# Patient Record
Sex: Female | Born: 1965 | Race: Black or African American | Hispanic: No | Marital: Single | Smoking: Never smoker
Health system: Southern US, Community
[De-identification: ages and names within clinical notes are randomized; demographics above are authoritative.]

## PROBLEM LIST (undated history)

## (undated) ENCOUNTER — Emergency Department: Admission: EM

## (undated) DIAGNOSIS — J45909 Unspecified asthma, uncomplicated: Secondary | ICD-10-CM

## (undated) DIAGNOSIS — I10 Essential (primary) hypertension: Secondary | ICD-10-CM

## (undated) DIAGNOSIS — E119 Type 2 diabetes mellitus without complications: Secondary | ICD-10-CM

## (undated) HISTORY — PX: TUBAL LIGATION: SHX77

## (undated) HISTORY — PX: JOINT REPLACEMENT: SHX530

---

## 2005-09-05 ENCOUNTER — Emergency Department: Payer: Self-pay | Admitting: Unknown Physician Specialty

## 2006-01-15 ENCOUNTER — Emergency Department: Payer: Self-pay | Admitting: Emergency Medicine
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
## 2006-05-09 ENCOUNTER — Other Ambulatory Visit: Payer: Self-pay

## 2006-05-09 ENCOUNTER — Emergency Department: Payer: Self-pay | Admitting: Emergency Medicine

## 2006-05-09 IMAGING — CT CT CHEST W/ CM
2 series · 16 of 31 positions shown, 20 images · IV contrast (APPLIED)
Comparison: none

REASON FOR EXAM: Pleuritic chest pain
COMMENTS:

[Series 4: soft tissue · axial · 0.74mm/px · z∈[-562,-475]mm · 3 of 96 slices shown]
[im 8/96  mediastinal]
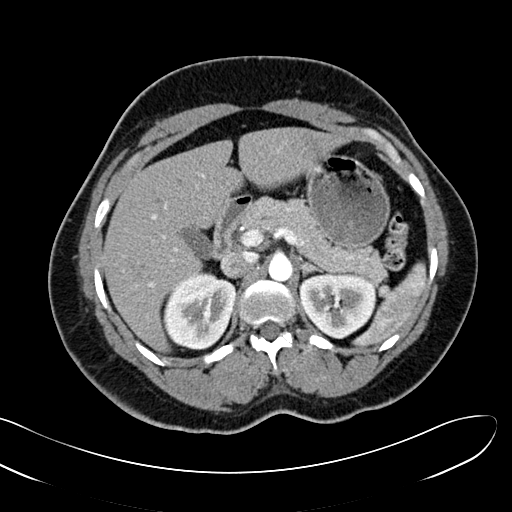
[im 22/96  mediastinal]
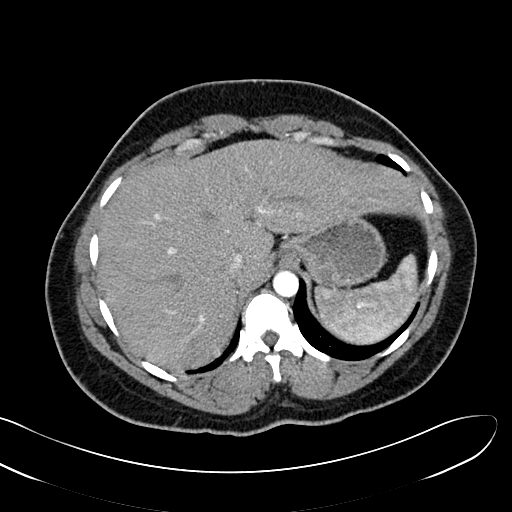
[im 37/96  mediastinal]
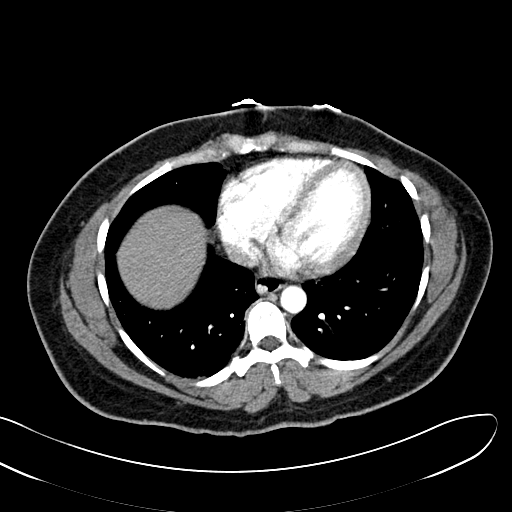

[Series 5: lung windows · axial · 0.74mm/px · z∈[-553,-322]mm · 13 of 93 slices shown, 17 images]
[im 8/93  mediastinal]
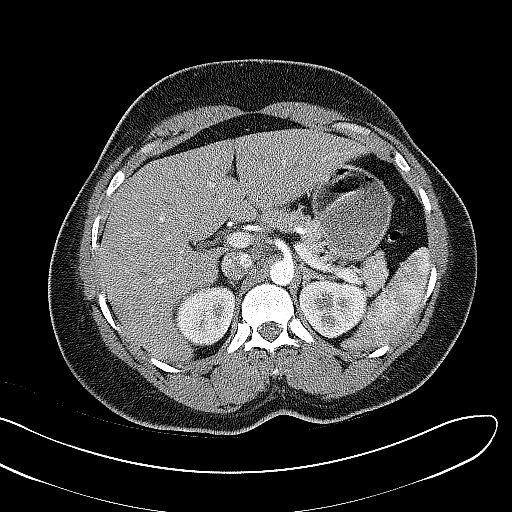
[im 8/93  lung]
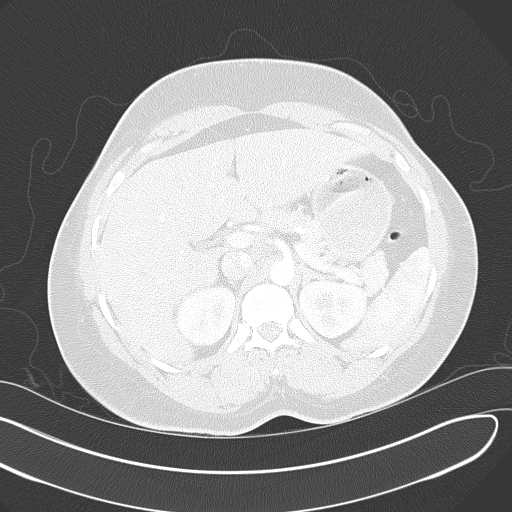
[im 15/93  lung]
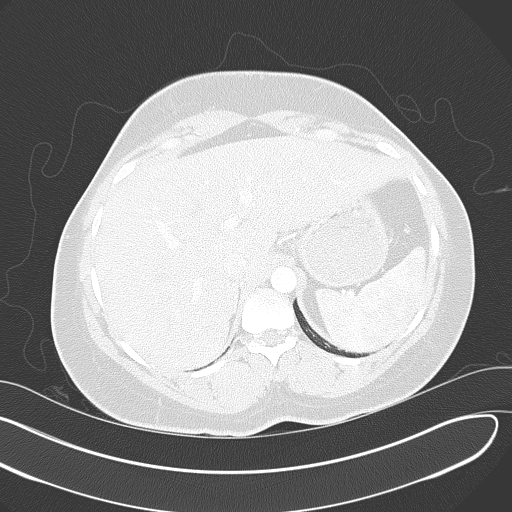
[im 22/93  lung]
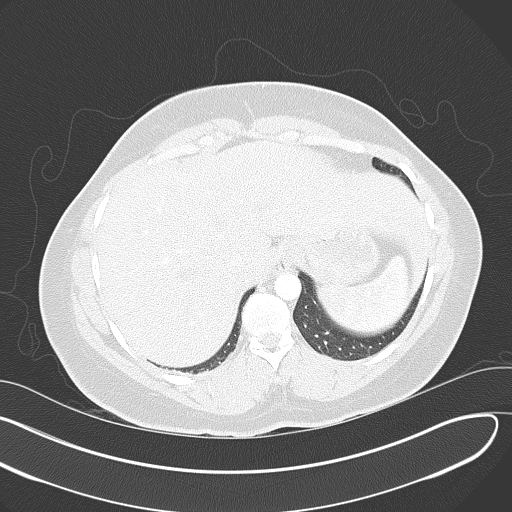
[im 29/93  lung]
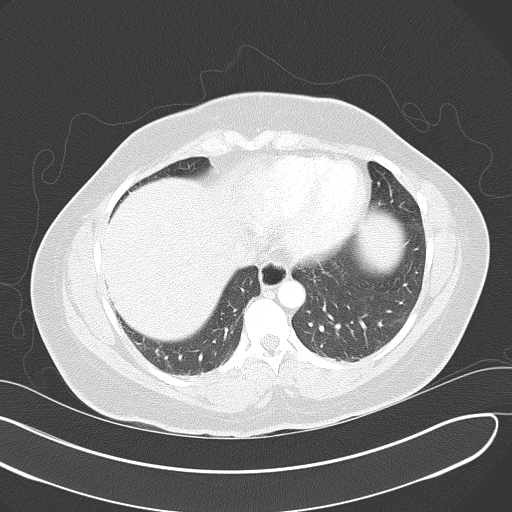
[im 36/93  mediastinal]
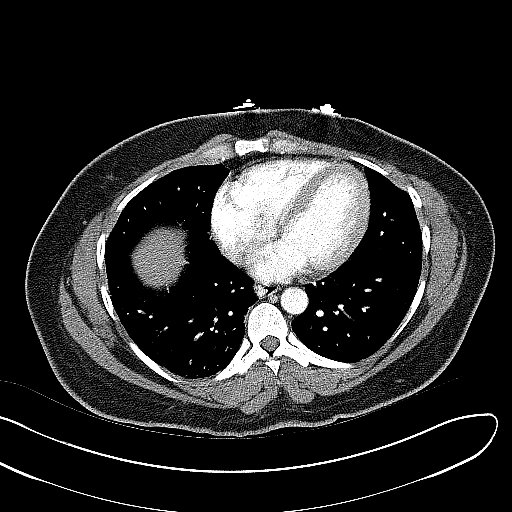
[im 36/93  lung]
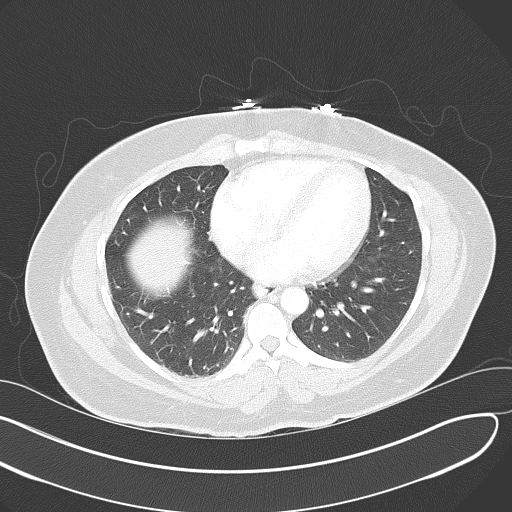
[im 43/93  lung]
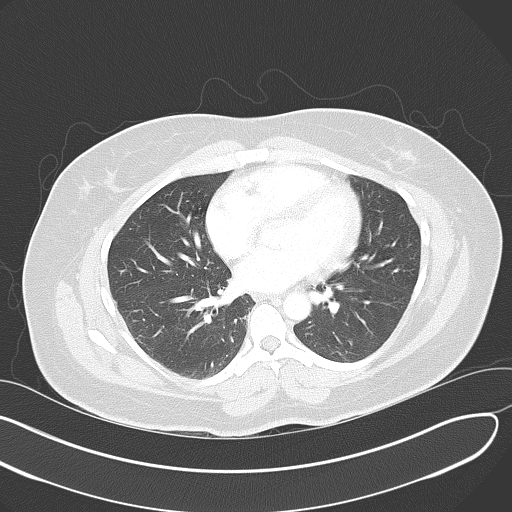
[im 47/93  lung]
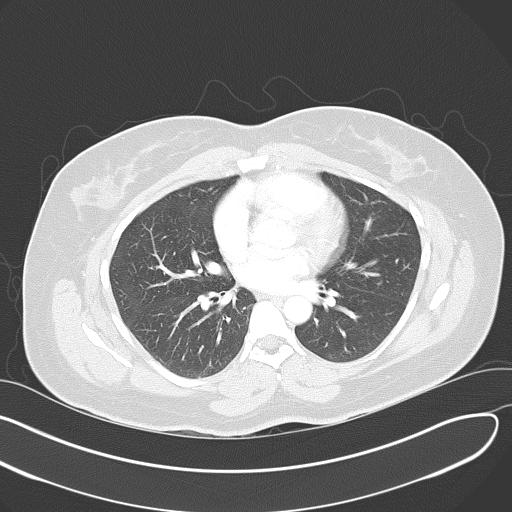
[im 50/93  lung]
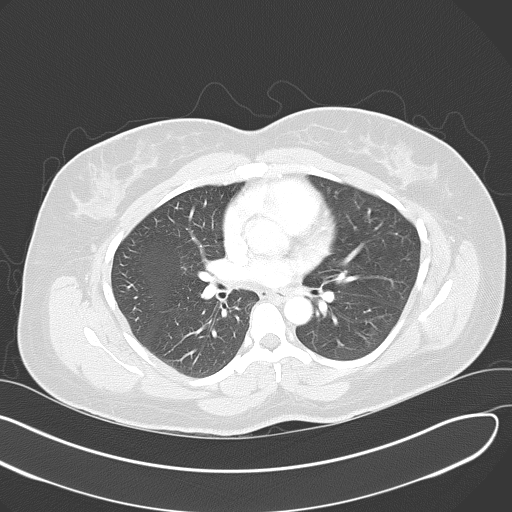
[im 57/93  mediastinal]
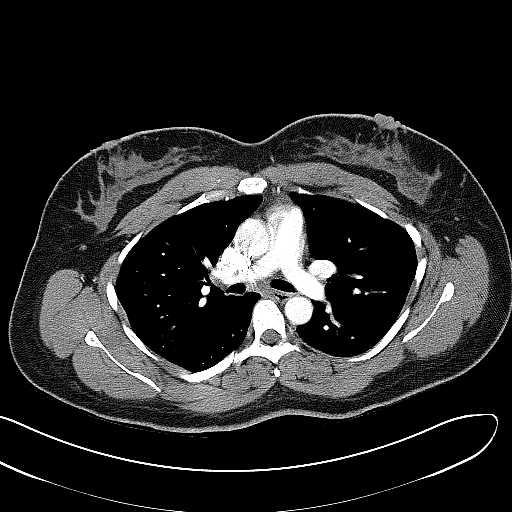
[im 57/93  lung]
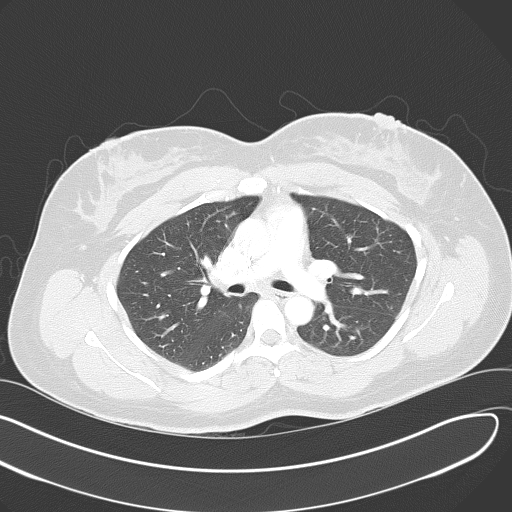
[im 64/93  lung]
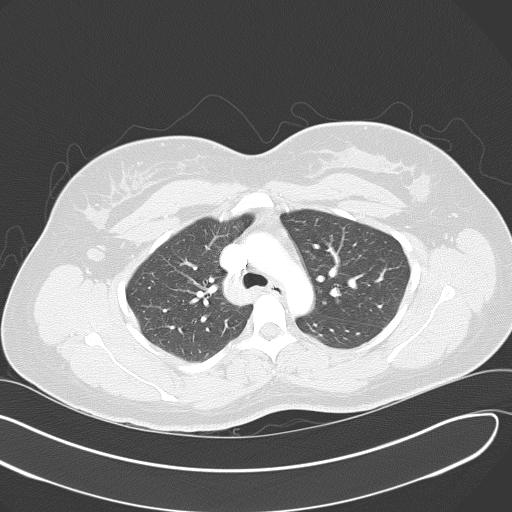
[im 71/93  lung]
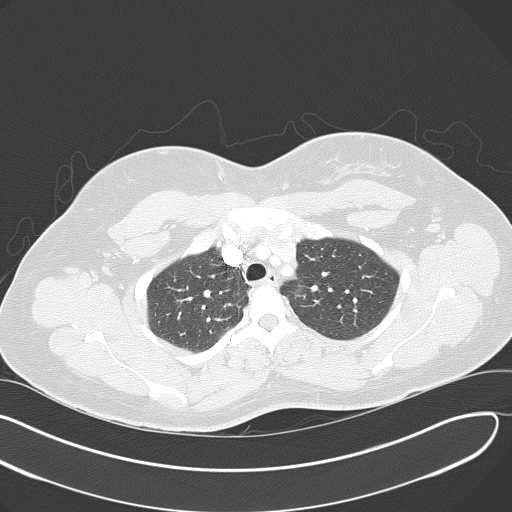
[im 78/93  lung]
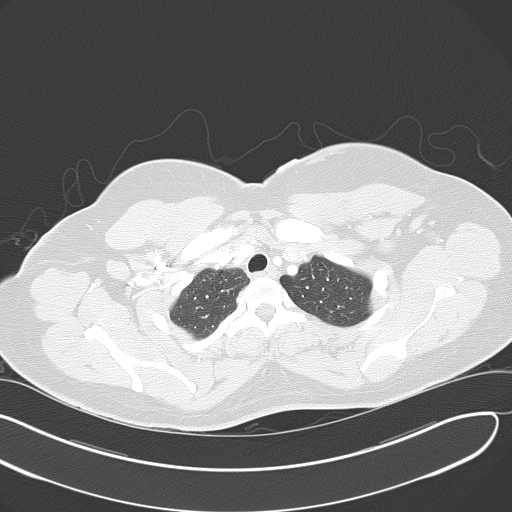
[im 85/93  mediastinal]
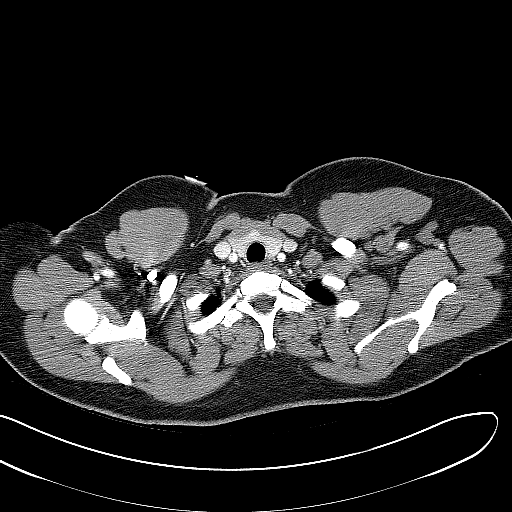
[im 85/93  lung]
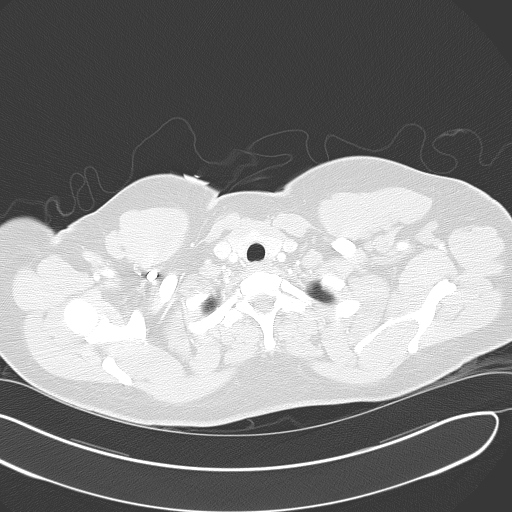

[16 of 31 positions shown; findings below may reference images not displayed]

PROCEDURE:     CT  - CT CHEST (FOR PE) W  - [DATE]  [DATE]

RESULT:     Emergent enhancement CHEST CT was performed for pleuritic chest
pain in the midchest.    The report was faxed emergency room.  There is a
good bolus of contrast in arteries.  No filling defects are noted to suggest
pulmonary emboli.   No mediastinal masses are seen.  There appears there is
a prominent azygous vein. On the lung windows, the lung fields are clear
with no effusions identified.
IMPRESSION: No CT findings of pulmonary embolus.  The thoracic aorta
appears intact.

## 2009-11-12 ENCOUNTER — Ambulatory Visit: Payer: Self-pay | Admitting: Family Medicine

## 2009-11-12 IMAGING — CR DG CHEST 2V
1 series · 2 of 2 positions shown · non-contrast
Comparison: none

REASON FOR EXAM: bronchitis,cough
COMMENTS:

PROCEDURE:     KDR - KDXR CHEST PA (OR AP) AND LAT  - [DATE] [DATE]
RESULT:     The lung fields are clear. The heart, mediastinal and osseous
structures are normal in appearance.

[Series 1: view not recorded · 0.17mm/px · 2 of 2 slices shown]
[im 1/2]
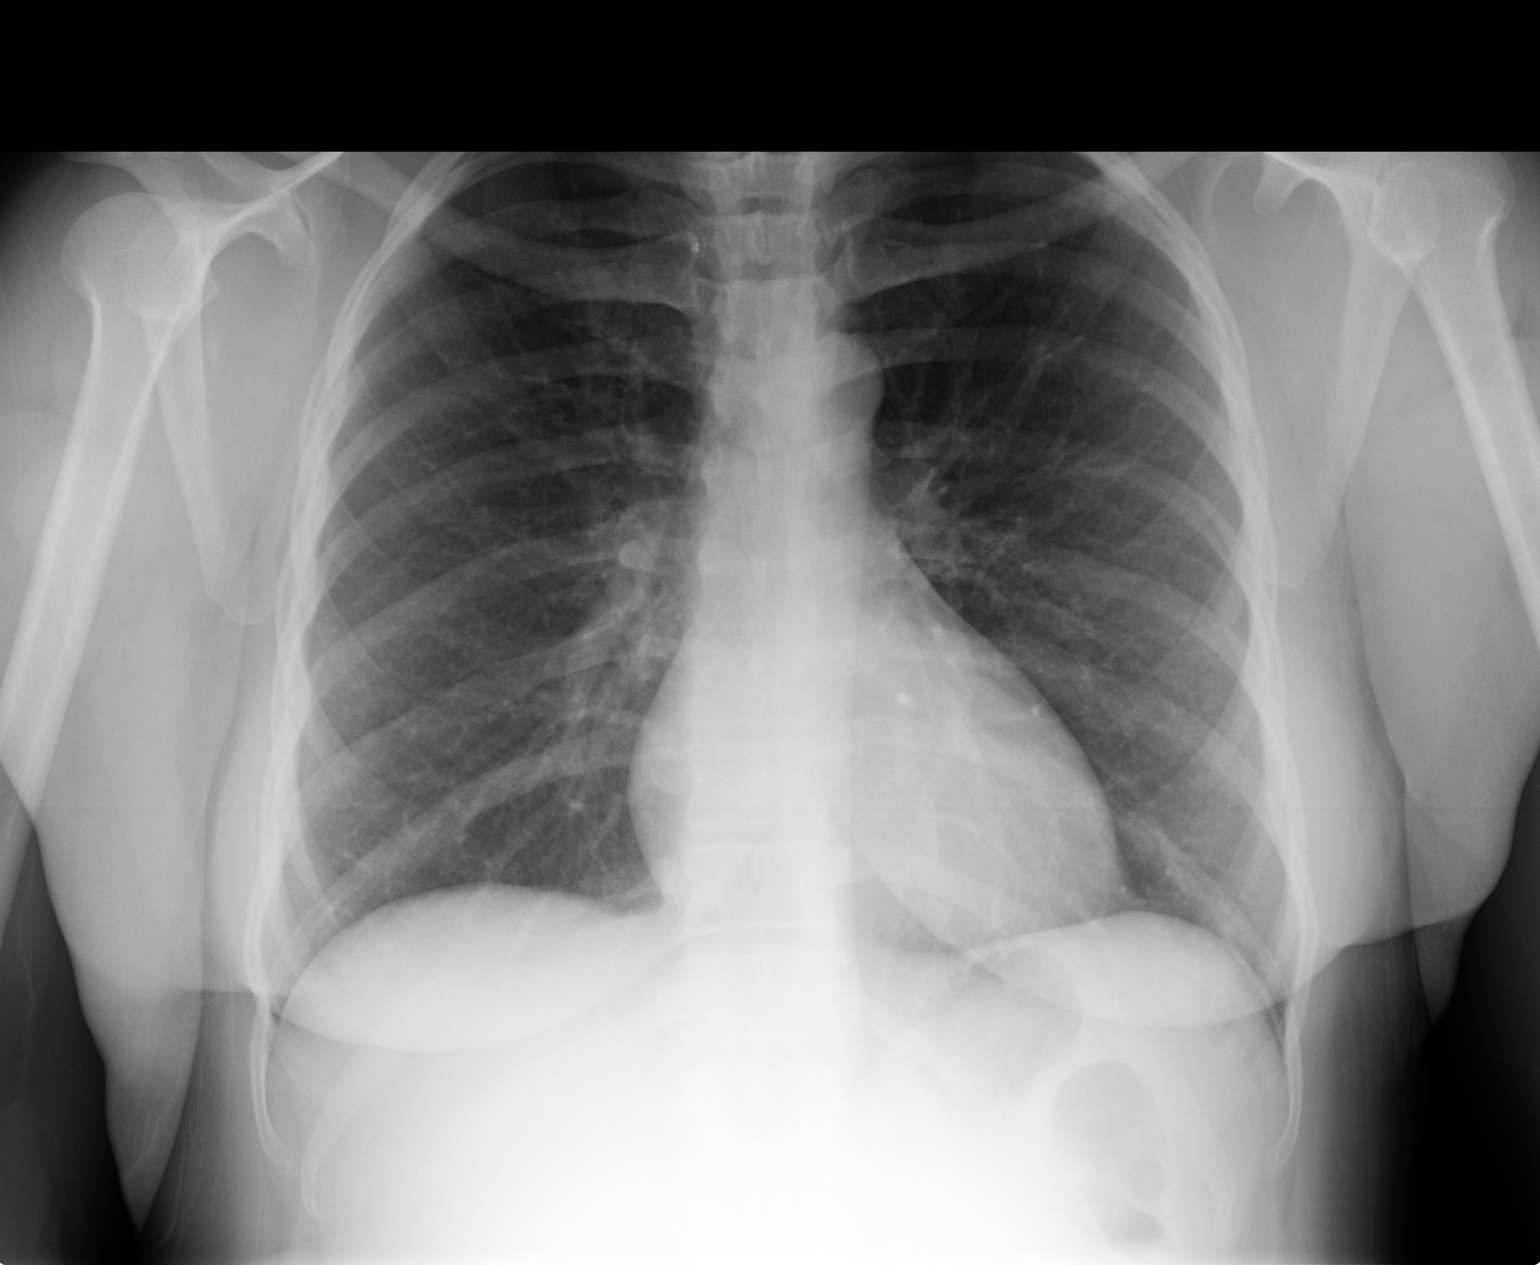
[im 2/2]
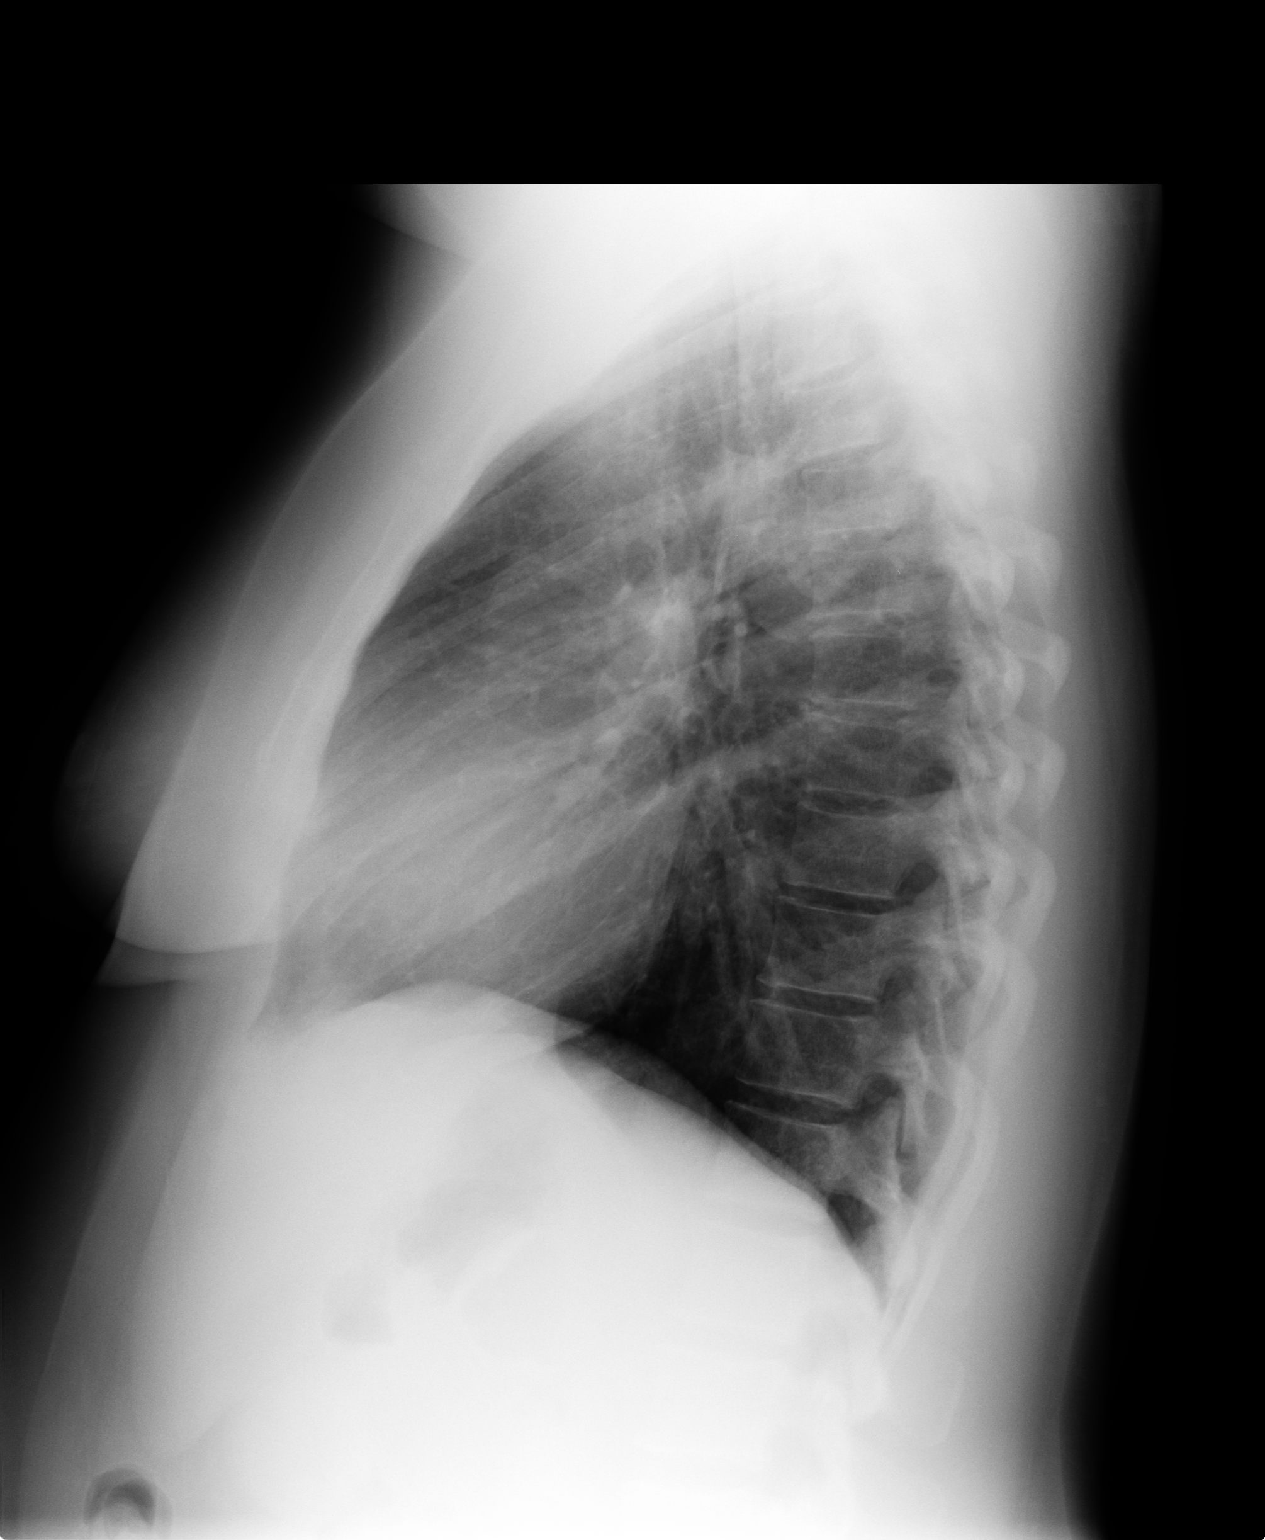

[2 of 2 positions shown; findings below may reference images not displayed]

IMPRESSION: No significant abnormalities are noted.

## 2010-11-27 ENCOUNTER — Emergency Department: Payer: Self-pay | Admitting: Emergency Medicine

## 2010-11-27 IMAGING — CT CT STONE STUDY
1 of 2 series · 14 of 32 positions shown, 18 images · non-contrast
Comparison: none

REASON FOR EXAM: flank pain
COMMENTS:

PROCEDURE:     CT  - CT ABDOMEN /PELVIS WO (STONE)  - [DATE]  [DATE]
RESULT:     Comparison: None
TECHNIQUE: Multiple axial images from the lung bases to the symphysis pubis
were obtained without oral and without intravenous contrast.

[Series 2: stone · axial · 0.68mm/px · z∈[-512,-152]mm · 14 of 137 slices shown, 18 images]
[im 11/137  soft-tissue]
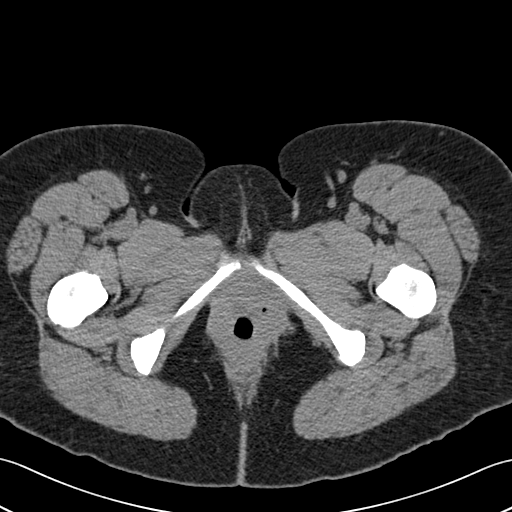
[im 11/137  bone]
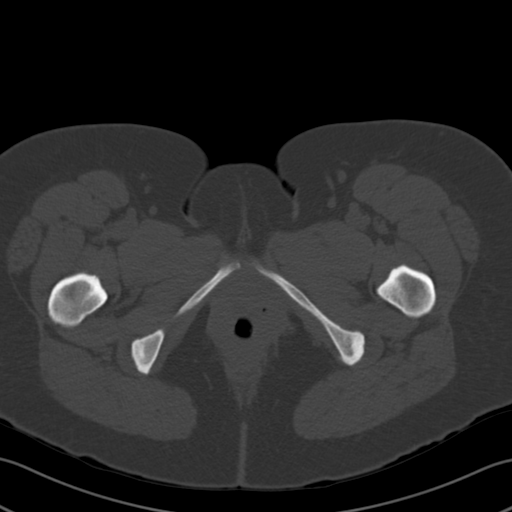
[im 21/137  soft-tissue]
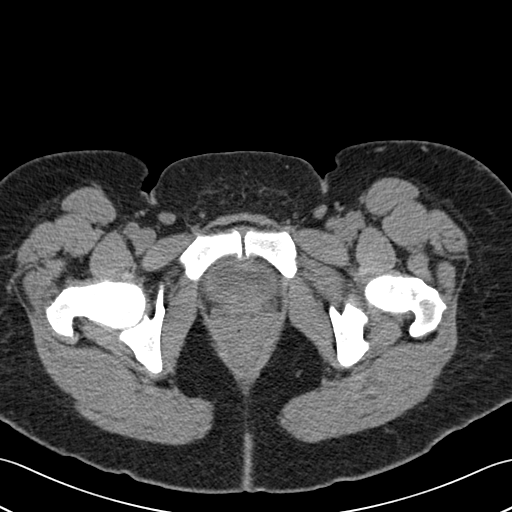
[im 31/137  soft-tissue]
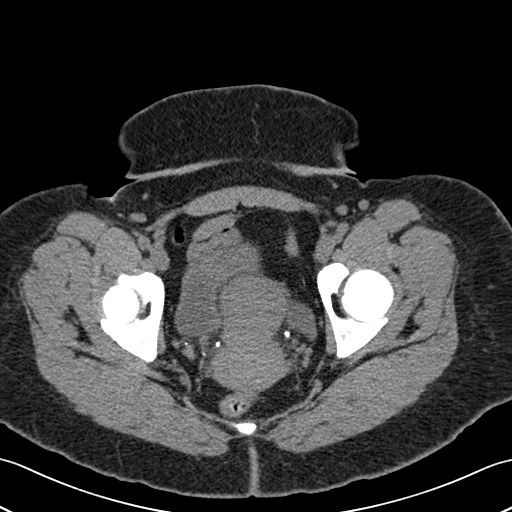
[im 41/137  soft-tissue]
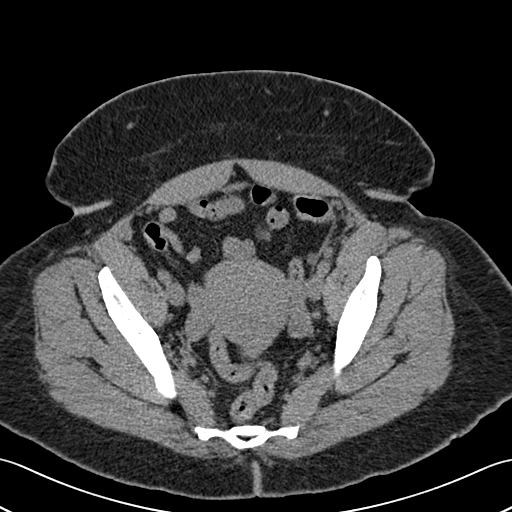
[im 51/137  soft-tissue]
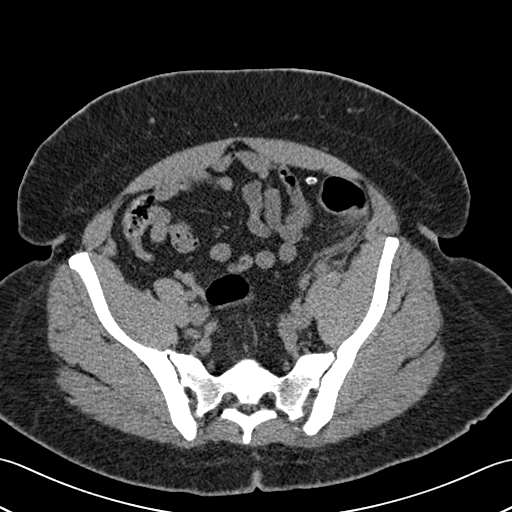
[im 61/137  soft-tissue]
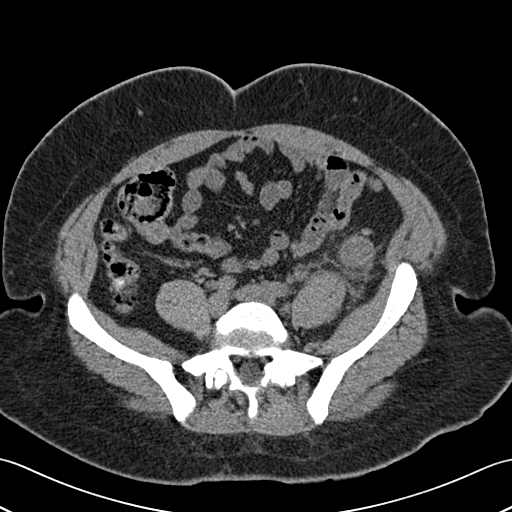
[im 76/137  soft-tissue]
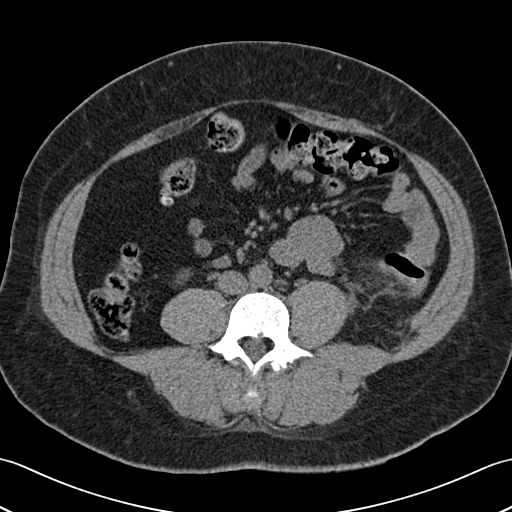
[im 86/137  soft-tissue]
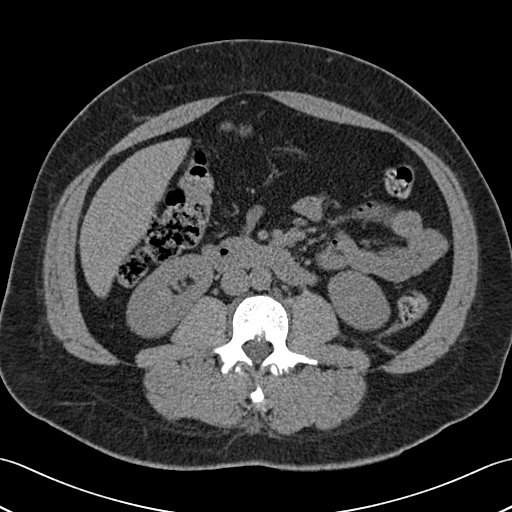
[im 96/137  soft-tissue]
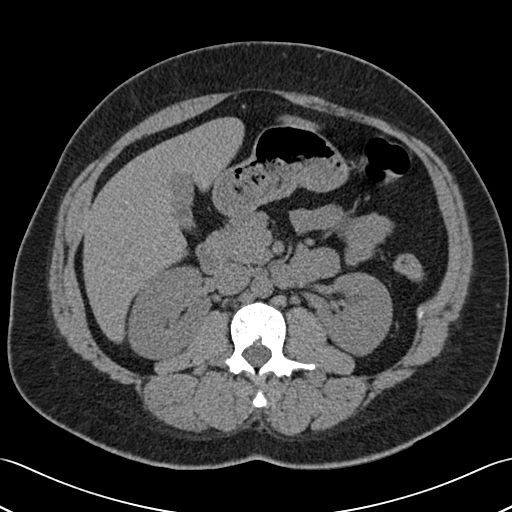
[im 96/137  bone]
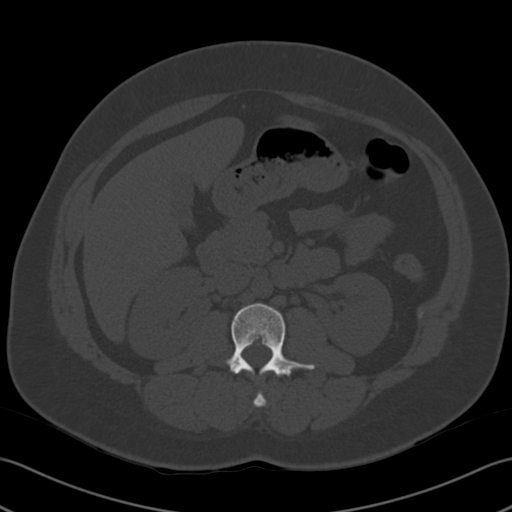
[im 106/137  soft-tissue]
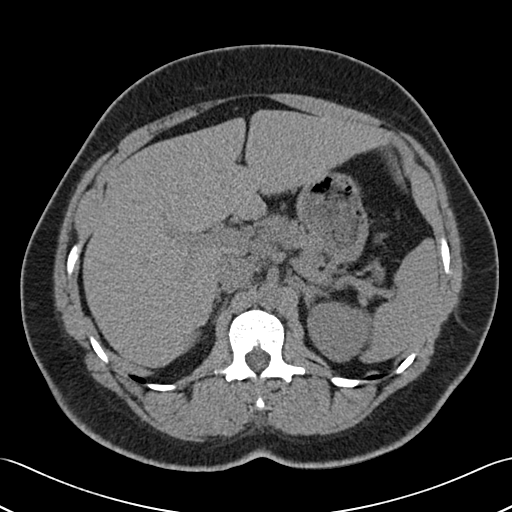
[im 116/137  soft-tissue]
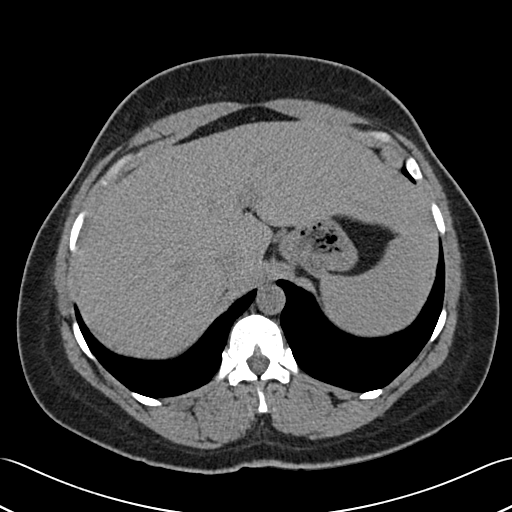
[im 116/137  lung]
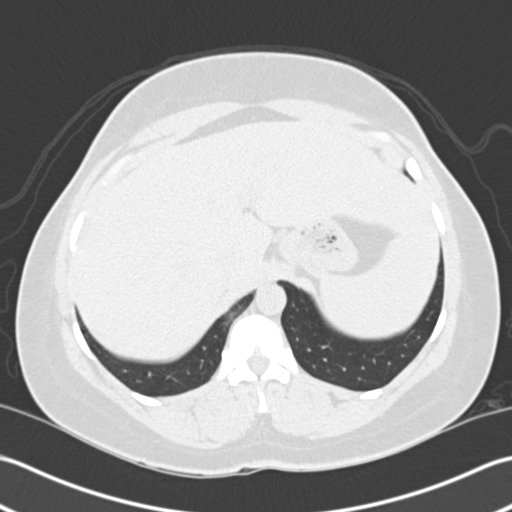
[im 121/137  lung]
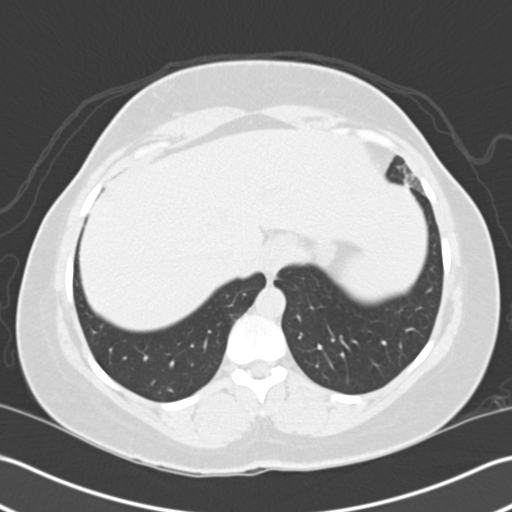
[im 126/137  soft-tissue]
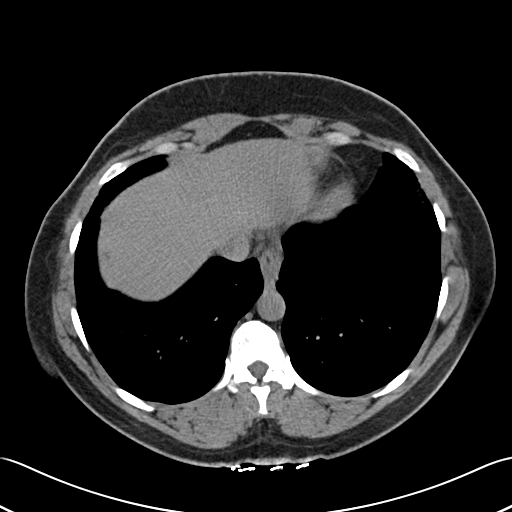
[im 126/137  lung]
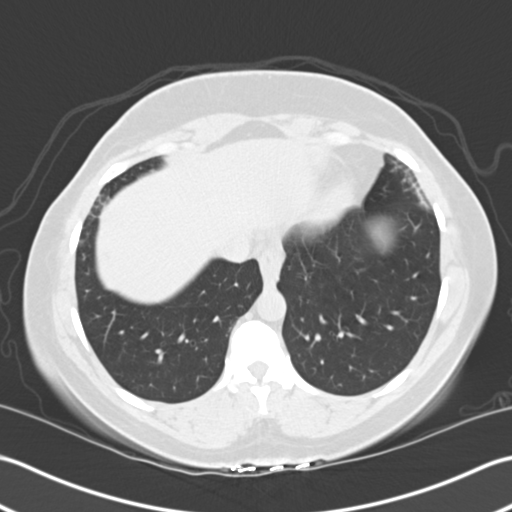
[im 131/137  lung]
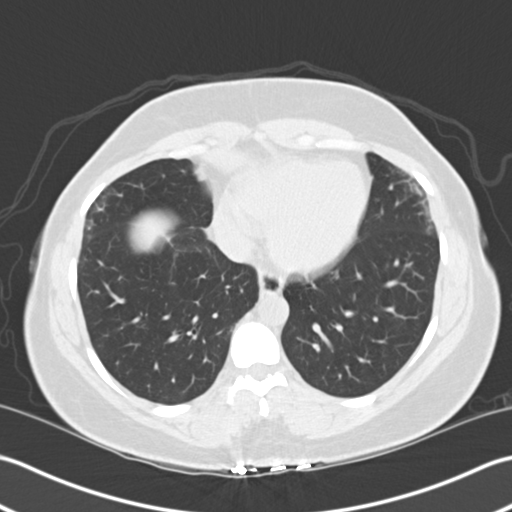

[14 of 32 positions shown; findings below may reference images not displayed]

FINDINGS: Minimal subpleural opacities likely represent atelectasis scarring.

Lack of intravenous contrast limits evaluation of the solid abdominal
organs.  Grossly, the liver, spleen, adrenals, pancreas, and gallbladder are
unremarkable.

No hydronephrosis. No renal calculi are identified. No ureterectasis. There
are multiple 3-4 mm calcifications in the region of the course of the mid
aspect of the left ureter. These are felt to be related to phleboliths
within the gonadal vein. However, it would be difficult to exclude a
nonobstructing calculus.

The small and large bowel are normal in caliber. There is mild wall
thickening with adjacent stranding of the descending colon. There is
diverticulosis of the descending colon. The inflammatory change of the
descending colon may be related to diverticulitis versus other colitis.
There are no adjacent fluid collections. The appendix is normal.

Small low-attenuation structure in the region of the urethra and vagina may
represent a urethral diverticulum versus vaginal or periurethral cyst. It
measures 2.0 x 1.2 cm in greatest axial dimension.

No aggressive lytic or sclerotic osseous lesions identified.
IMPRESSION: 1. Diverticulosis of the descending colon with wall thickening and adjacent
inflammatory change of the descending colon. This may represent acute
diverticulitis. Given the length of inflammatory changes, other etiologies
such as an infectious, inflammatory, or ischemic colitis should be
considered. Direct visualization is suggested after the acute episode, as
malignancy can have a similar appearance
2. Multiple small 3-4 mm calcifications in the region of the mid left ureter
are felt to be related to phleboliths in the gonadal vein. However, it would
be difficult to exclude a nonobstructing ureteral calculus.
3. Urethral diverticulum versus periurethral or vaginal cyst. Clinical
correlation is suggested.

## 2012-06-30 ENCOUNTER — Emergency Department: Payer: Self-pay | Admitting: Internal Medicine

## 2012-09-29 ENCOUNTER — Emergency Department: Payer: Self-pay | Admitting: Internal Medicine

## 2013-11-28 ENCOUNTER — Emergency Department: Payer: Self-pay | Admitting: Emergency Medicine

## 2013-11-28 LAB — URINALYSIS, COMPLETE
RBC,UR: 4713 /HPF (ref 0–5)
Specific Gravity: 1.025 (ref 1.003–1.030)

## 2013-11-28 LAB — BASIC METABOLIC PANEL
ANION GAP: 4 — AB (ref 7–16)
BUN: 12 mg/dL (ref 7–18)
CO2: 28 mmol/L (ref 21–32)
CREATININE: 0.94 mg/dL (ref 0.60–1.30)
Calcium, Total: 8.4 mg/dL — ABNORMAL LOW (ref 8.5–10.1)
Chloride: 106 mmol/L (ref 98–107)
EGFR (African American): >60
EGFR (Non-African Amer.): >60
Glucose: 94 mg/dL (ref 65–99)
Osmolality: 275 (ref 275–301)
POTASSIUM: 3.7 mmol/L (ref 3.5–5.1)
Sodium: 138 mmol/L (ref 136–145)

## 2013-11-28 LAB — CBC
HCT: 37.9 % (ref 35.0–47.0)
HGB: 12.5 g/dL (ref 12.0–16.0)
MCH: 28.1 pg (ref 26.0–34.0)
MCHC: 33.1 g/dL (ref 32.0–36.0)
MCV: 85 fL (ref 80–100)
Platelet: 257 10*3/uL (ref 150–440)
RBC: 4.46 10*6/uL (ref 3.80–5.20)
RDW: 13.7 % (ref 11.5–14.5)
WBC: 10.9 10*3/uL (ref 3.6–11.0)

## 2013-11-28 LAB — PREGNANCY, URINE: PREGNANCY TEST, URINE: NEGATIVE m[IU]/mL

## 2013-11-28 IMAGING — CT CT STONE STUDY
2 of 4 series · 17 of 46 positions shown, 19 images · non-contrast
Comparison: CT [DATE]

CLINICAL DATA: Left flank pain

EXAM:
CT ABDOMEN AND PELVIS WITHOUT CONTRAST
TECHNIQUE: Multidetector CT imaging of the abdomen and pelvis was performed
following the standard protocol without IV contrast.

[Series 2: stone standard full · axial · 0.77mm/px · z∈[-1050,-645]mm · 14 of 89 slices shown, 16 images]
[im 4/89  soft-tissue]
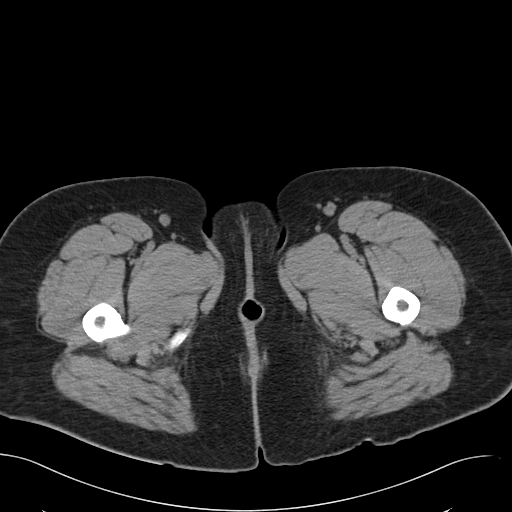
[im 4/89  bone]
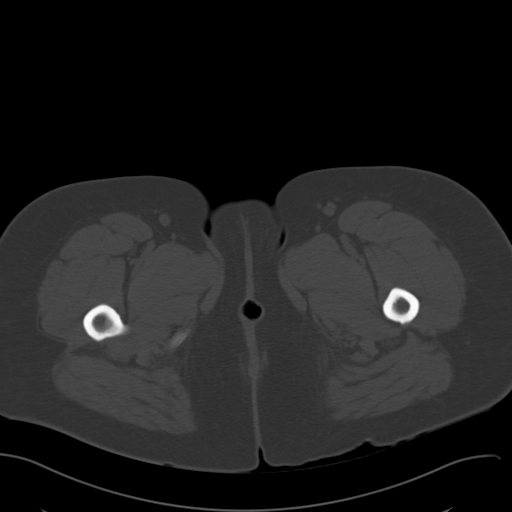
[im 12/89  soft-tissue]
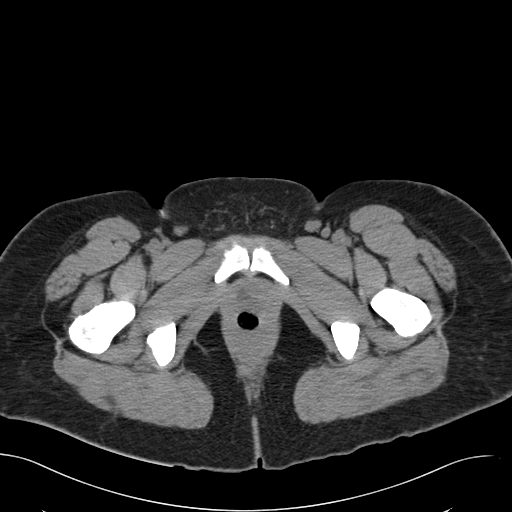
[im 19/89  soft-tissue]
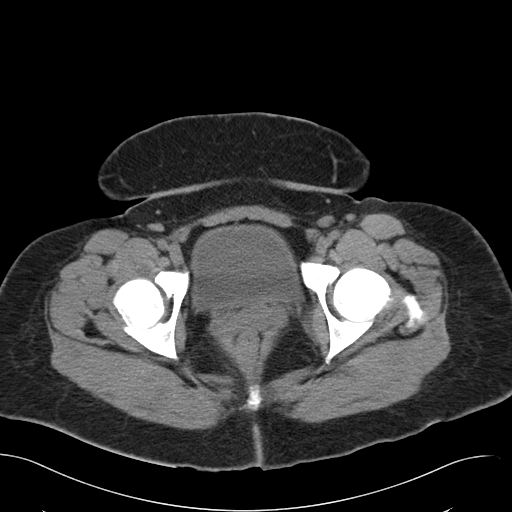
[im 23/89  soft-tissue]
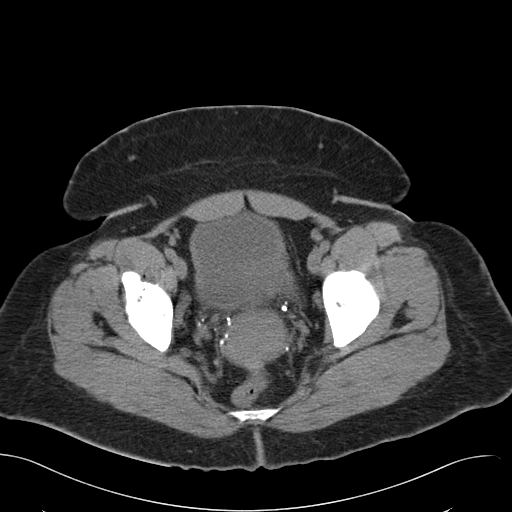
[im 30/89  soft-tissue]
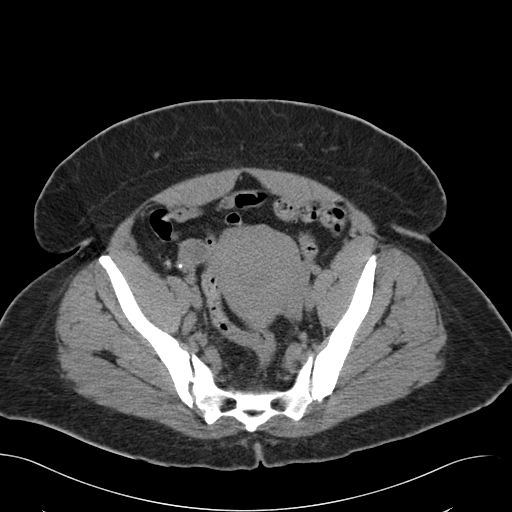
[im 37/89  soft-tissue]
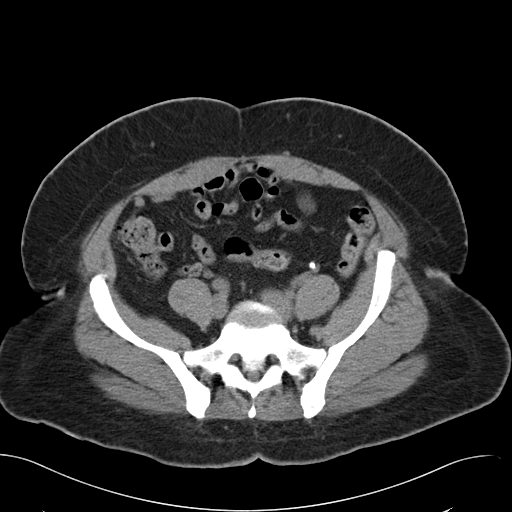
[im 41/89  soft-tissue]
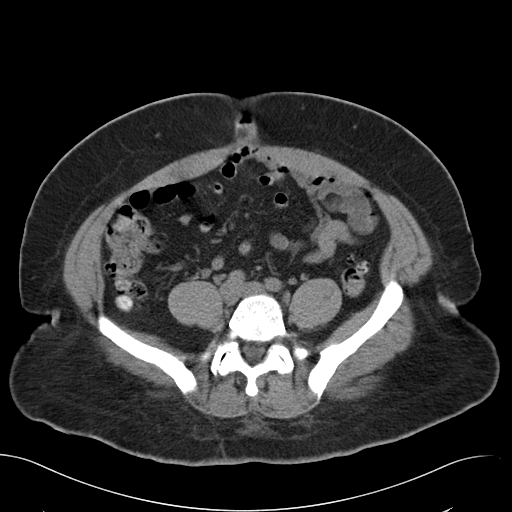
[im 48/89  soft-tissue]
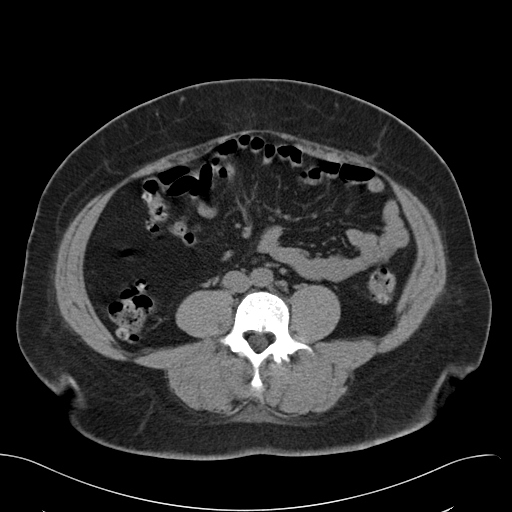
[im 52/89  soft-tissue]
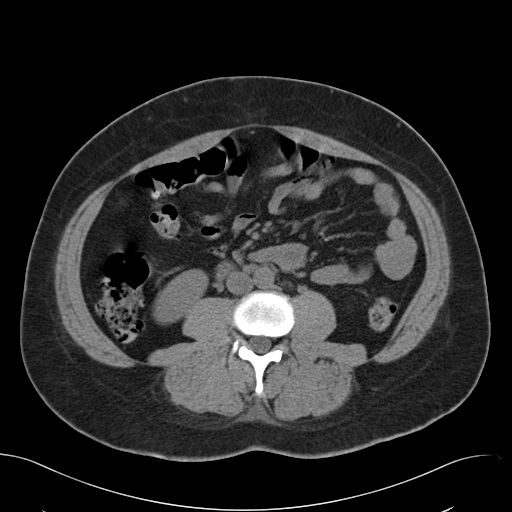
[im 52/89  bone]
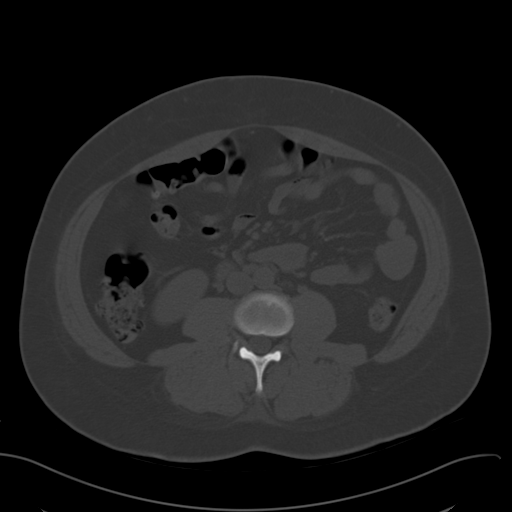
[im 59/89  soft-tissue]
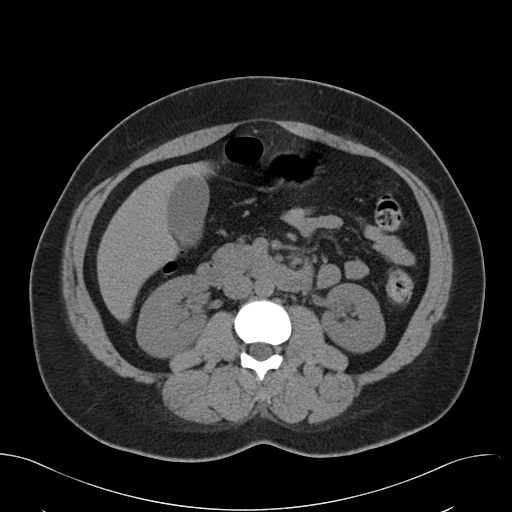
[im 67/89  soft-tissue]
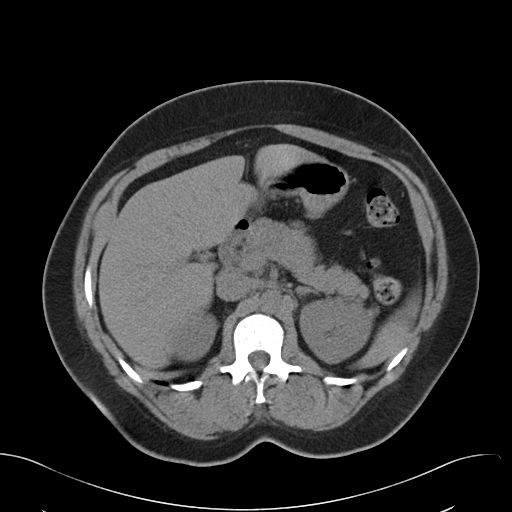
[im 70/89  soft-tissue]
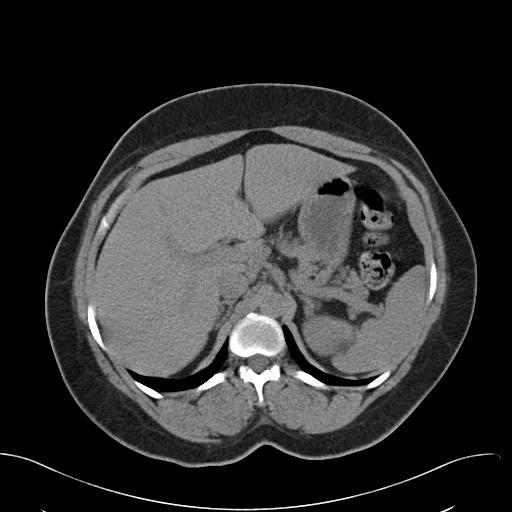
[im 78/89  soft-tissue]
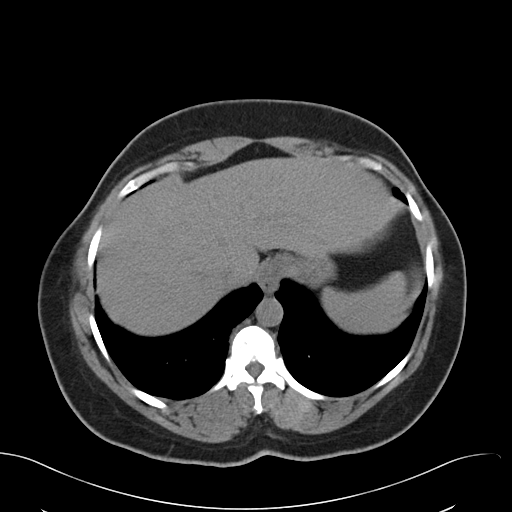
[im 85/89  soft-tissue]
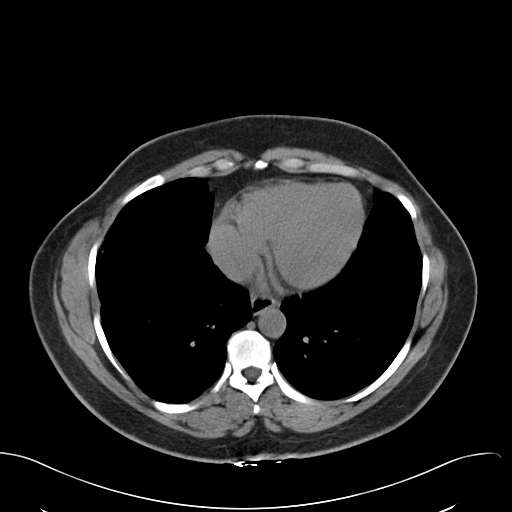

[Series 5: cor stone standard full · coronal · 0.81mm/px · 3 of 146 slices shown]
[im 49/146  soft-tissue]
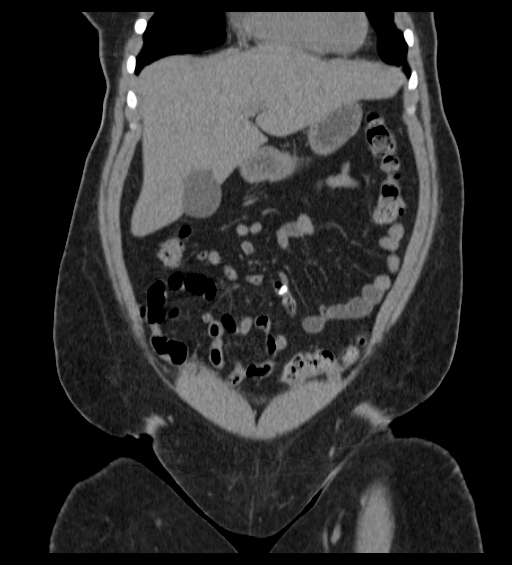
[im 65/146  soft-tissue]
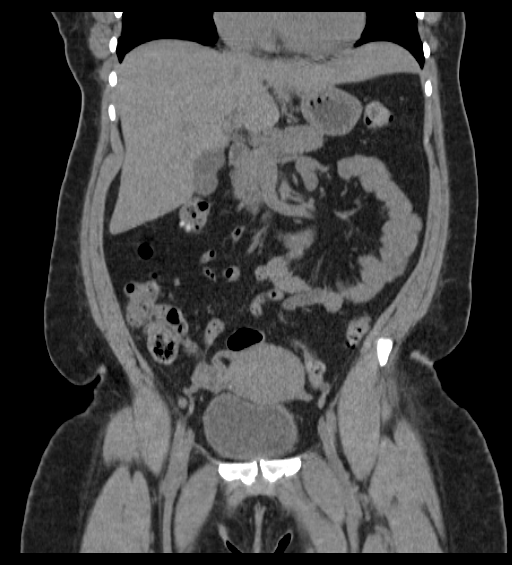
[im 81/146  soft-tissue]
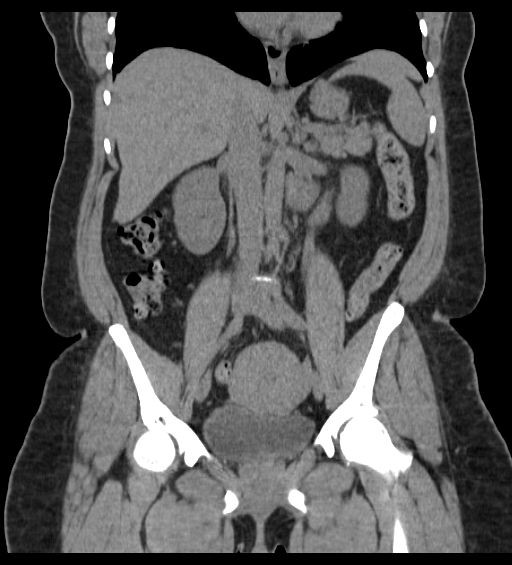

[17 of 46 positions shown; findings below may reference images not displayed]

FINDINGS: Lung bases are clear.

Unenhanced images of the liver and spleen are normal. Gallbladder
and bile ducts are normal. The kidneys are normal without
obstruction mass or stone.

Pan diverticulosis noted throughout the colon. Barium is present in
multiple diverticulae in the colon. Previous thickening in the
sigmoid colon on the left has resolved. Currently no evidence of
diverticulitis. Appendix is normal. Negative for bowel obstruction.
Negative for mass or adenopathy. No free fluid and no acute bony
change.
IMPRESSION: Pandiverticulosis of the colon. No evidence of diverticulitis. No
acute abnormality.

## 2014-06-20 ENCOUNTER — Emergency Department: Payer: Self-pay | Admitting: Emergency Medicine
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
## 2014-11-09 ENCOUNTER — Emergency Department: Payer: Self-pay | Admitting: Emergency Medicine

## 2014-11-09 IMAGING — CR DG LUMBAR SPINE COMPLETE 4+V
1 series · 5 of 5 positions shown · non-contrast
Comparison: [DATE]

CLINICAL DATA: Lower extremity pain.

EXAM:
LUMBAR SPINE - COMPLETE 4+ VIEW

[Series 1: dxr lumbar spine with obliques · 0.14mm/px · 5 of 5 slices shown]
[im 1/5]
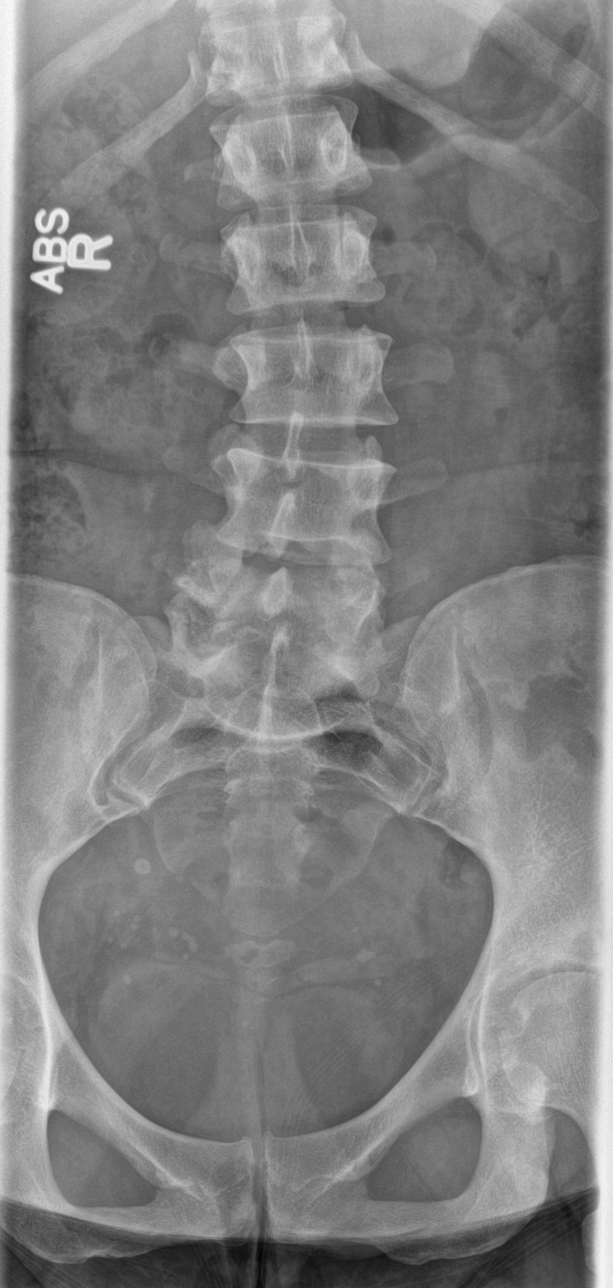
[im 2/5]
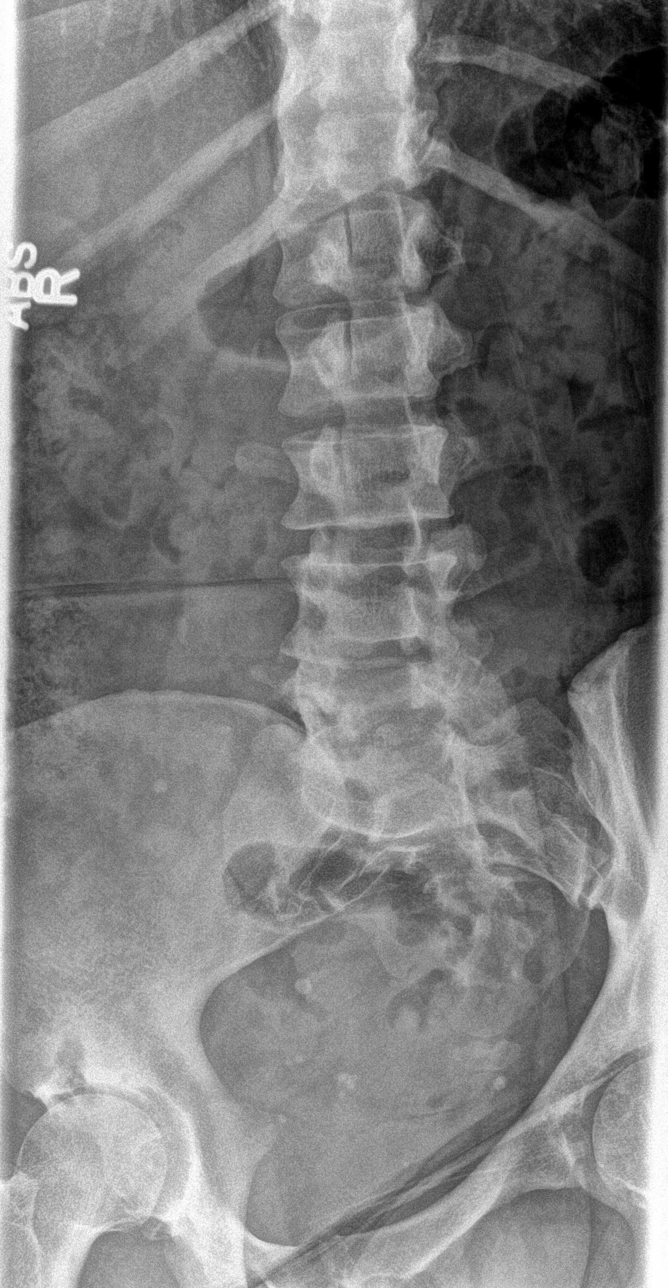
[im 3/5]
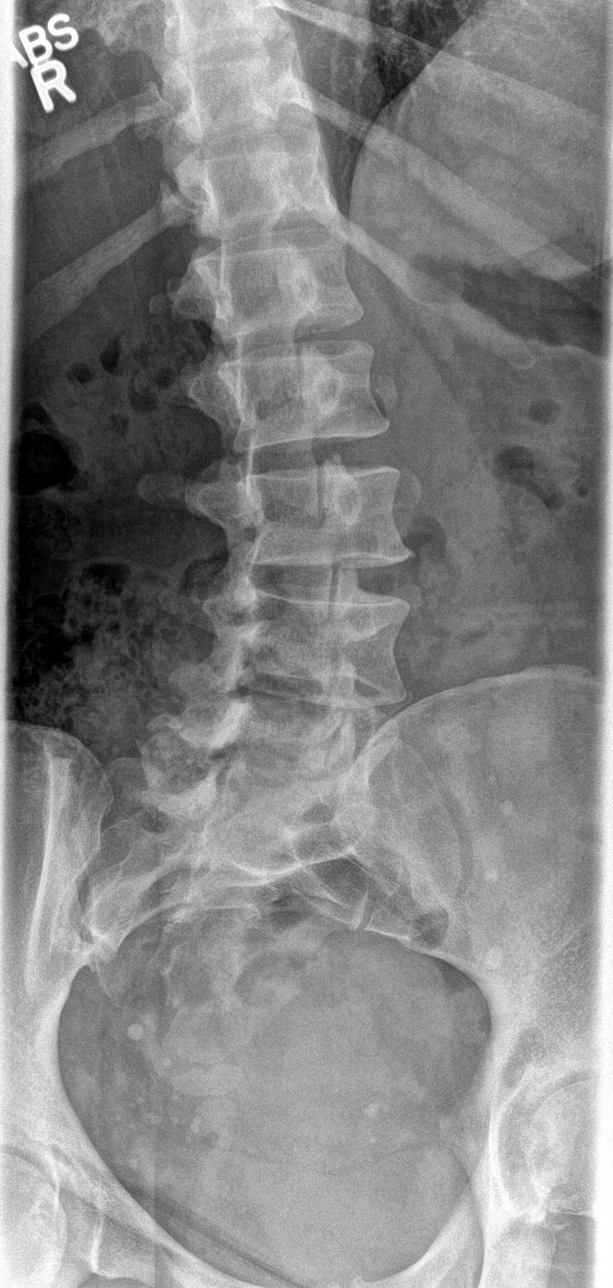
[im 4/5]
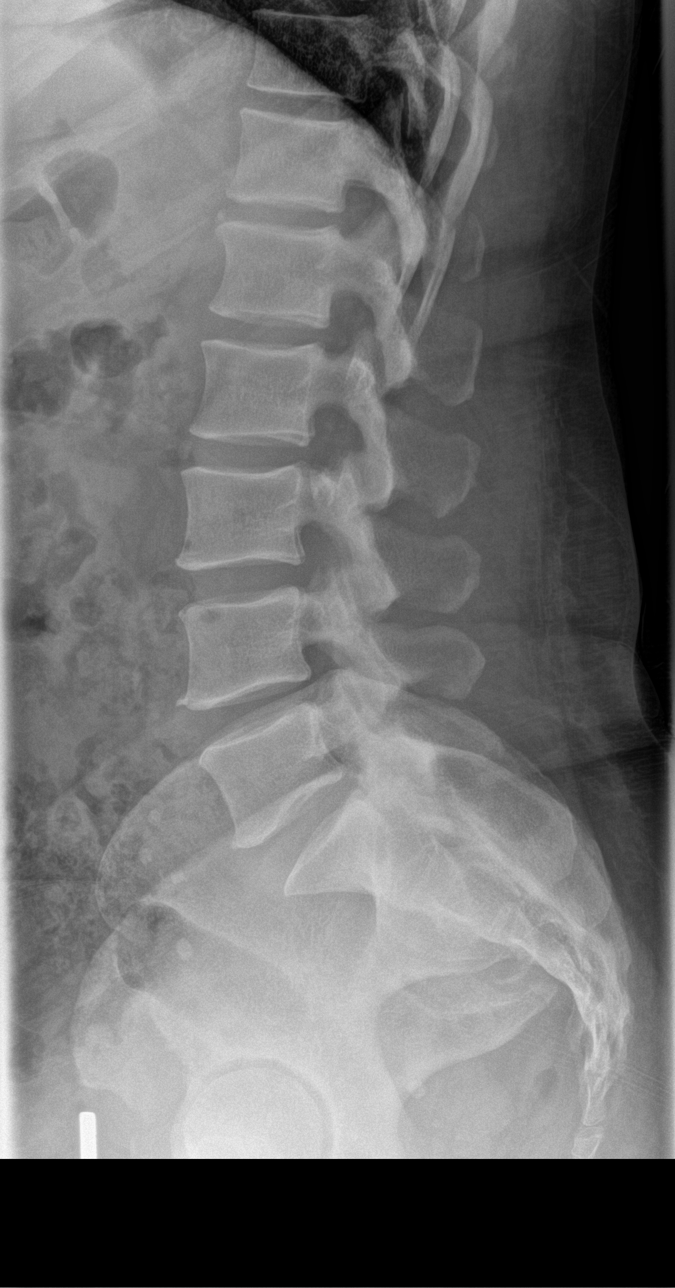
[im 5/5]
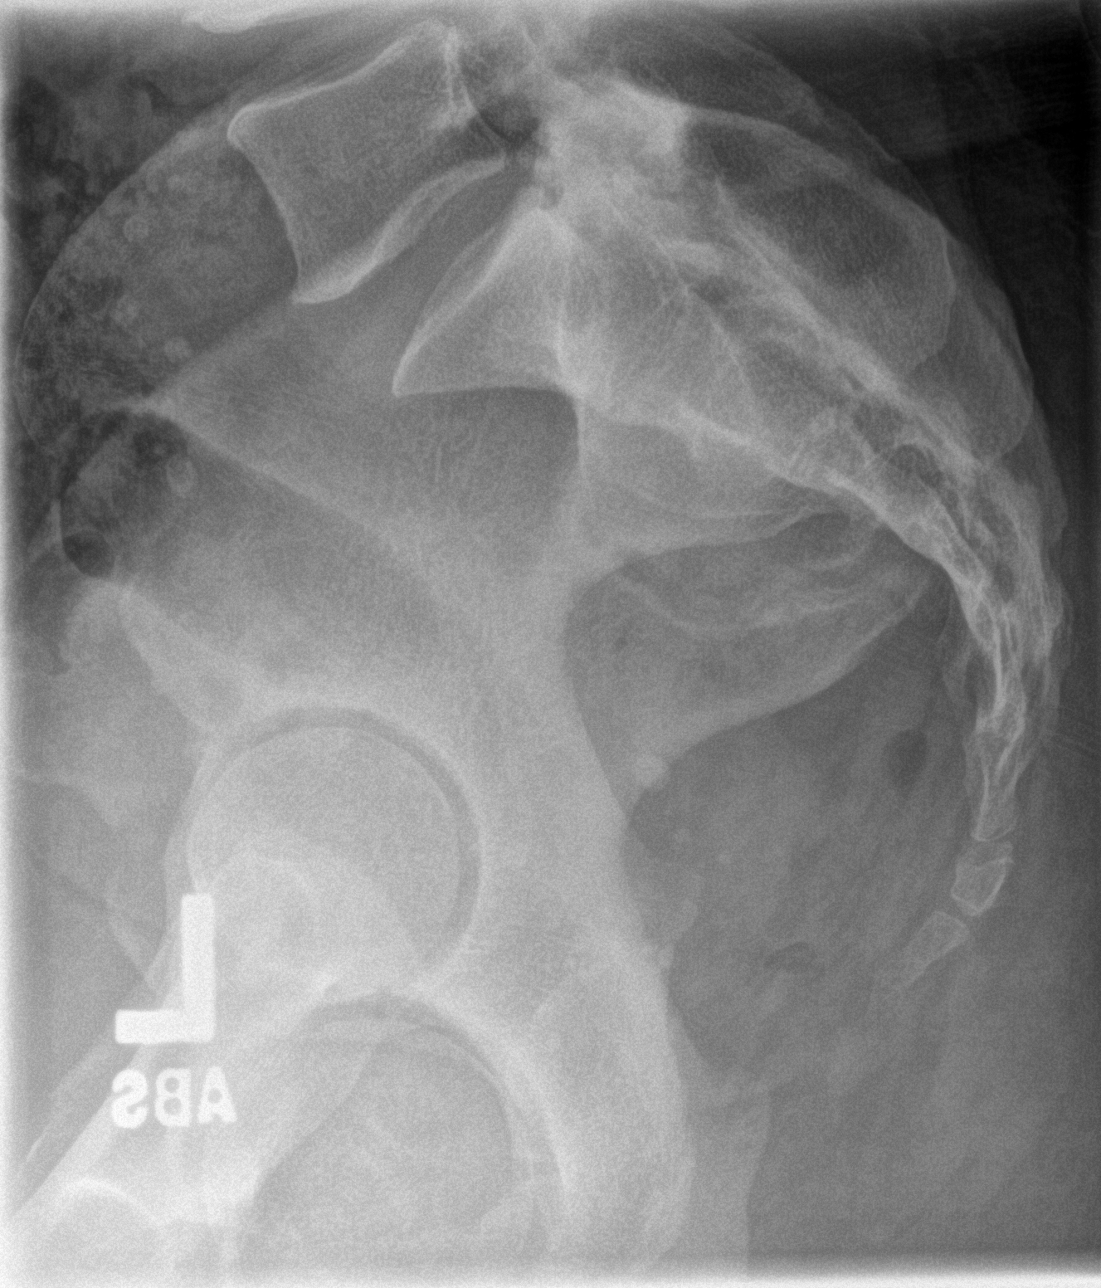

[5 of 5 positions shown; findings below may reference images not displayed]

FINDINGS: 16 degrees of levoconvex lumbar scoliosis between L1 and L5. No
significant loss of intervertebral disc height or significant
subluxation. Mild degenerative facet arthropathy at L5-S1. No
fracture or definite pars defects observed.
IMPRESSION: 1. Facet arthropathy at L5-S 1, with 16 degrees of levoconvex lumbar
scoliosis. No subluxation or fracture observed.

## 2015-03-30 ENCOUNTER — Emergency Department: Payer: Self-pay

## 2015-03-30 ENCOUNTER — Emergency Department
Admission: EM | Admit: 2015-03-30 | Discharge: 2015-03-30 | Disposition: A | Discharge status: Home / Self Care | Payer: Self-pay | Attending: Emergency Medicine | Admitting: Emergency Medicine

## 2015-03-30 DIAGNOSIS — J42 Unspecified chronic bronchitis: Secondary | ICD-10-CM

## 2015-03-30 DIAGNOSIS — J209 Acute bronchitis, unspecified: Secondary | ICD-10-CM

## 2015-03-30 DIAGNOSIS — J4521 Mild intermittent asthma with (acute) exacerbation: Secondary | ICD-10-CM | POA: Insufficient documentation

## 2015-03-30 HISTORY — DX: Unspecified asthma, uncomplicated: J45.909

## 2015-03-30 IMAGING — CR DG CHEST 2V
2 series · 2 of 2 positions shown · non-contrast
Comparison: None.

CLINICAL DATA: Asthma cough.

EXAM:
CHEST  2 VIEW

[chest pa]
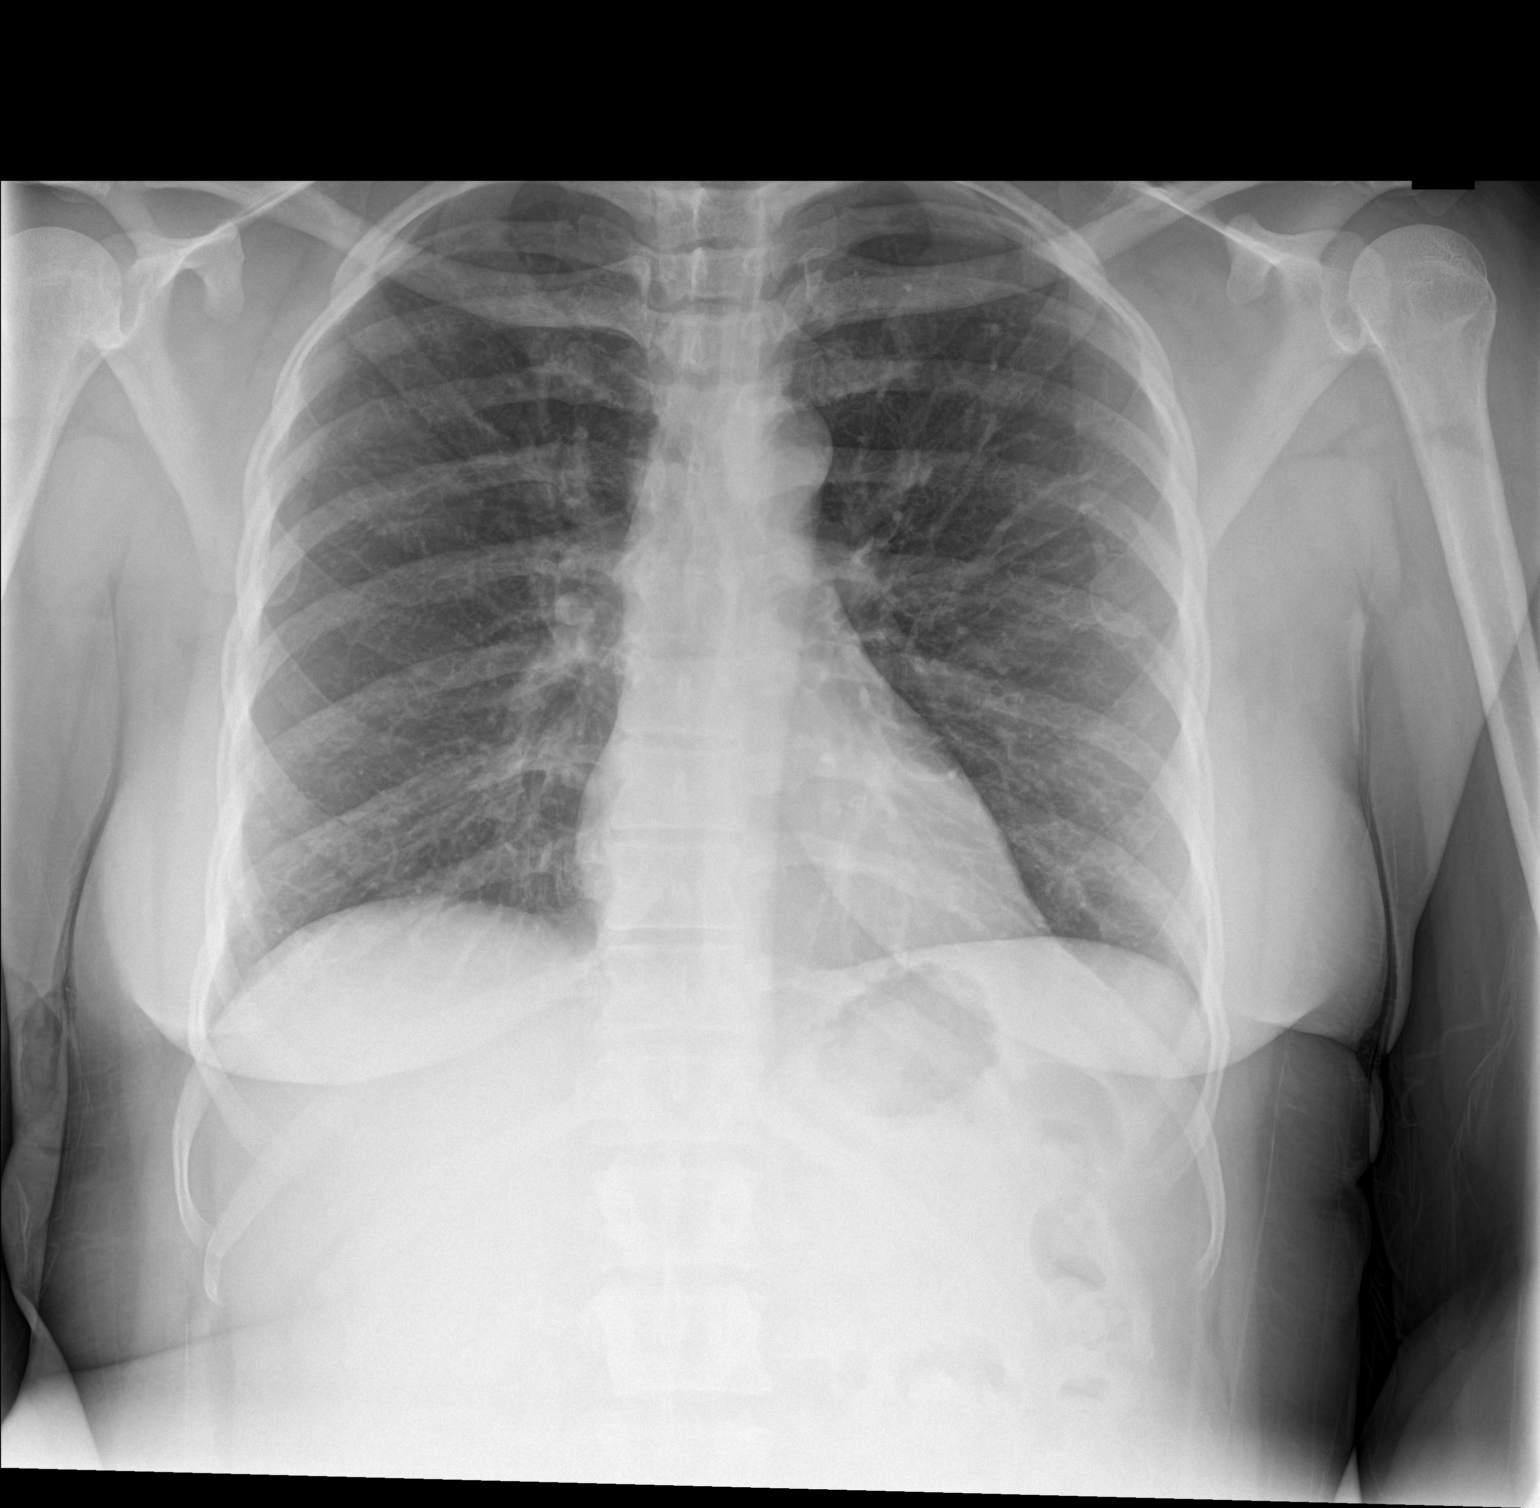

[chest lat]
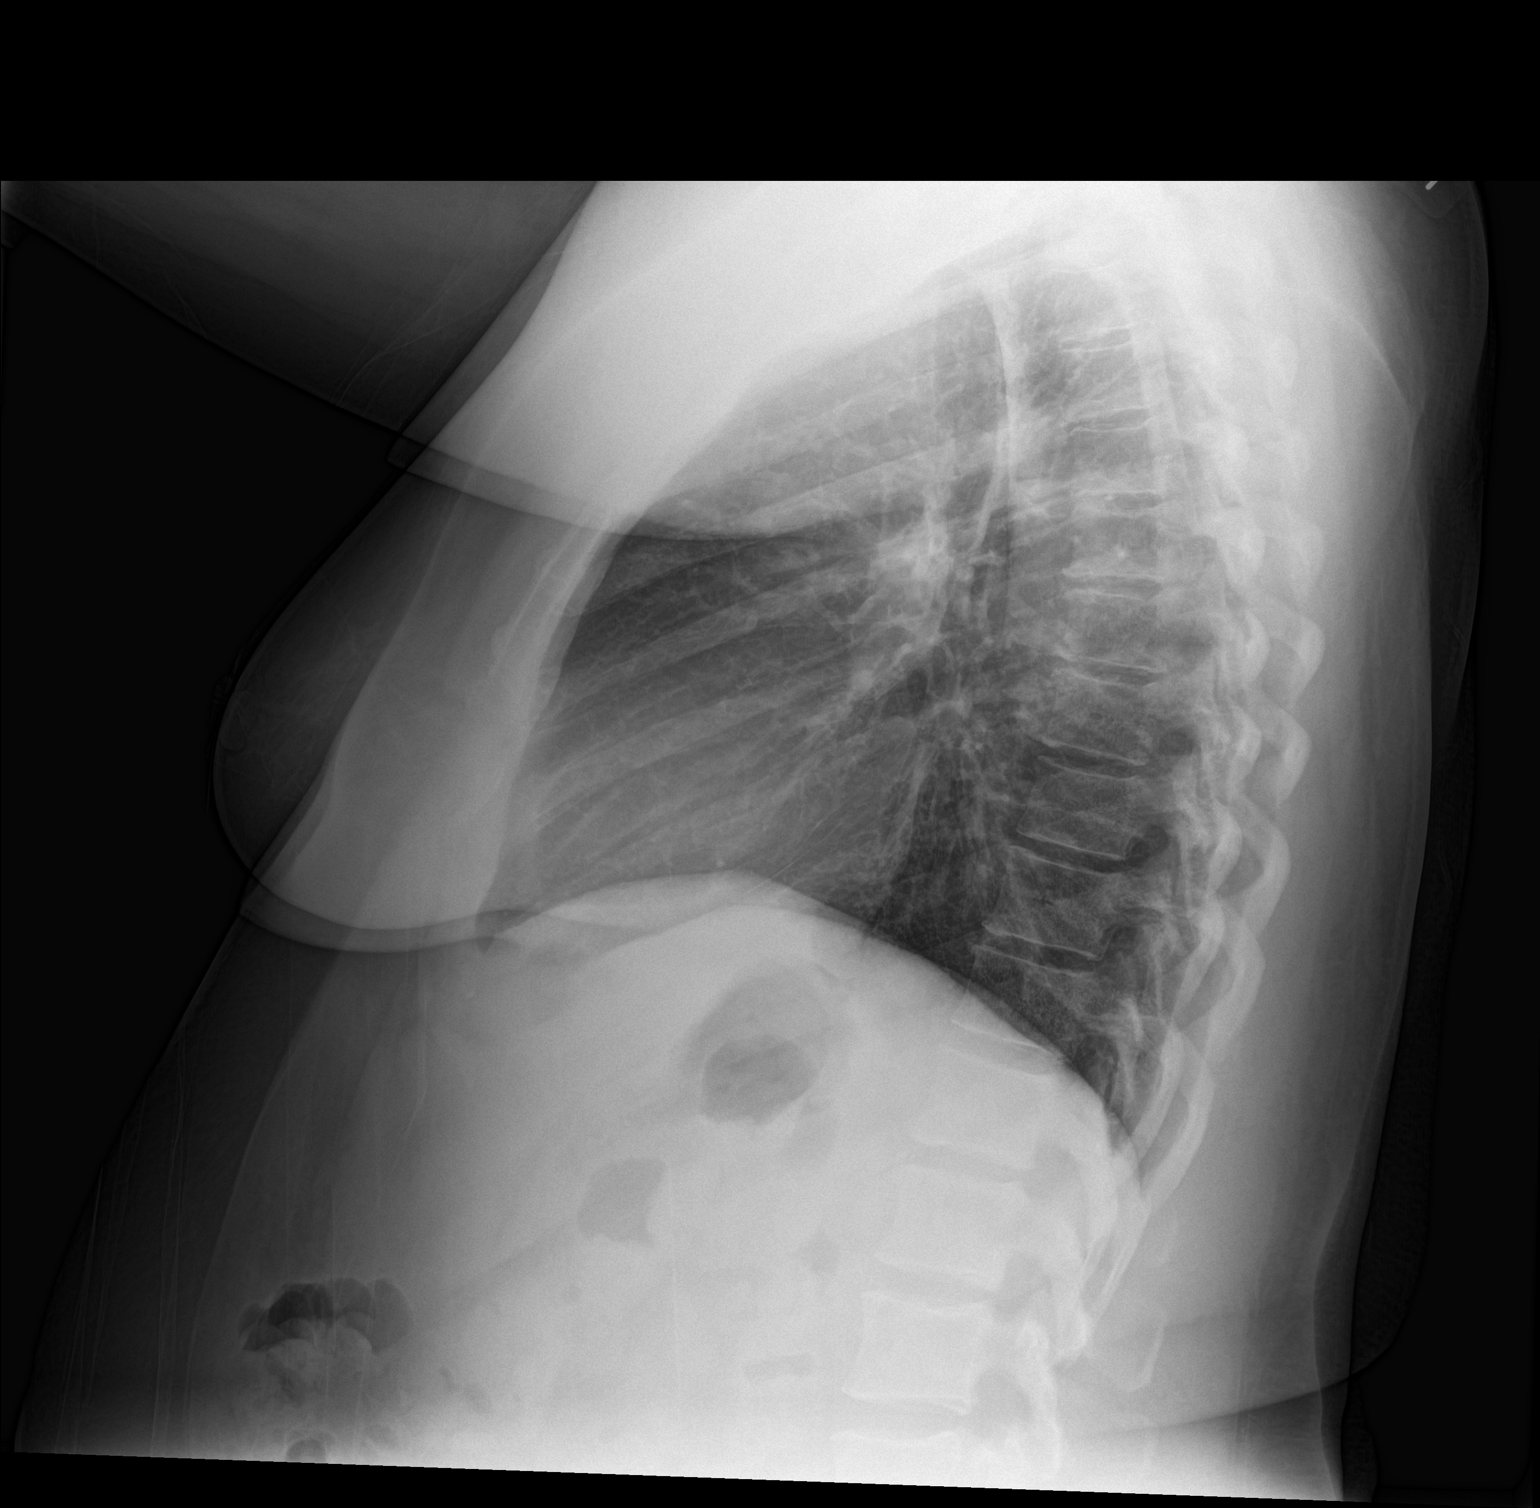

[2 of 2 positions shown; findings below may reference images not displayed]

FINDINGS: Normal mediastinum and cardiac silhouette. Chronic central
bronchitic markings. Normal pulmonary vasculature. No effusion,
infiltrate, or pneumothorax.
IMPRESSION: Chronic bronchitic markings.  No acute findings.

## 2015-03-30 MED ORDER — AZITHROMYCIN 250 MG PO TABS: ORAL_TABLET | ORAL | Status: DC

## 2015-03-30 MED ORDER — GUAIFENESIN-CODEINE 100-10 MG/5ML PO SOLN: 10.0000 mL | ORAL | Status: DC | PRN

## 2015-03-30 MED ORDER — ALBUTEROL SULFATE HFA 108 (90 BASE) MCG/ACT IN AERS: 2.0000 | INHALATION_SPRAY | Freq: Four times a day (QID) | RESPIRATORY_TRACT | Status: DC | PRN

## 2015-03-30 MED ORDER — IPRATROPIUM-ALBUTEROL 0.5-2.5 (3) MG/3ML IN SOLN
3.0000 mL | Freq: Once | RESPIRATORY_TRACT | Status: AC
  Administered 2015-03-30: 3 mL via RESPIRATORY_TRACT
  Filled 2015-03-30: qty 3

## 2015-03-30 NOTE — Discharge Instructions (Signed)

## 2015-03-30 NOTE — ED Notes (Signed)
Pt c/o having asthma and over the past couple of days it is flared up with cough and sinus congestion

## 2015-03-30 NOTE — ED Provider Notes (Signed)
[facility] Emergency Department Provider Note  ____________________________________________  Time seen: Approximately 6:13 PM  I have reviewed the triage vital signs and the nursing notes.   HISTORY  Chief Complaint Asthma and Nasal Congestion    HPI Samantha Deleon is a 49 y.o. female who has come to the ER with complaints of having difficulty breathing with a history of asthma over the past couple days. Patient states that she has a productive greenish cough associated with this with some sinus congestion. Denies any fever chills nausea vomiting. Eating and drinking fluids well.   Past Medical History  Diagnosis Date  . Asthma     There are no active problems to display for this patient.   Past Surgical History  Procedure Laterality Date  . Tubal ligation      Current Outpatient Rx  Name  Route  Sig  Dispense  Refill  . albuterol (PROVENTIL HFA;VENTOLIN HFA) 108 (90 BASE) MCG/ACT inhaler   Inhalation   Inhale 2 puffs into the lungs every 6 (six) hours as needed for wheezing or shortness of breath.   1 Inhaler   2   . azithromycin (ZITHROMAX Z-PAK) 250 MG tablet      Take 2 tablets (500 mg) on  Day 1,  followed by 1 tablet (250 mg) once daily on Days 2 through 5.   6 each   0   . guaiFENesin-codeine 100-10 MG/5ML syrup   Oral   Take 10 mLs by mouth every 4 (four) hours as needed for cough.   180 mL   0     Allergies Review of patient's allergies indicates no known allergies.  No family history on file.  Social History History  Substance Use Topics  . Smoking status: Never Smoker   . Smokeless tobacco: Never Used  . Alcohol Use: No    Review of Systems Constitutional: No fever/chills Eyes: No visual changes. ENT: No sore throat. Cardiovascular: Denies chest pain. Respiratory: Positive shortness of breath with cough Gastrointestinal: No abdominal pain.  No nausea, no vomiting.  No diarrhea.  No  constipation. Genitourinary: Negative for dysuria. Musculoskeletal: Negative for back pain. Skin: Negative for rash. Neurological: Negative for headaches, focal weakness or numbness.  10-point ROS otherwise negative.  ____________________________________________   PHYSICAL EXAM:  VITAL SIGNS: ED Triage Vitals  Enc Vitals Group     BP 03/30/15 1716 154/75 mmHg     Pulse Rate 03/30/15 1716 96     Resp 03/30/15 1716 18     Temp 03/30/15 1716 98.6 F (37 C)     Temp Source 03/30/15 1716 Oral     SpO2 03/30/15 1716 93 %     Weight 03/30/15 1716 174 lb (78.926 kg)     Height 03/30/15 1716 5\' 3"  (1.6 m)     Head Cir --      Peak Flow --      Pain Score 03/30/15 1718 9     Pain Loc --      Pain Edu? --      Excl. in [place]? --     Constitutional: Alert and oriented. Well appearing and in no acute distress. Eyes: Conjunctivae are normal. PERRL. EOMI. Head: Atraumatic. Nose: No congestion/rhinnorhea. Mouth/Throat: Mucous membranes are moist.  Oropharynx non-erythematous. Neck: No stridor.   Cardiovascular: Normal rate, regular rhythm. Grossly normal heart sounds.  Good peripheral circulation. Respiratory: Normal respiratory effort.  No retractions. Lungs CTAB. Gastrointestinal: Soft and nontender. No distention. No abdominal bruits. No  CVA tenderness. Musculoskeletal: No lower extremity tenderness nor edema.  No joint effusions. Neurologic:  Normal speech and language. No gross focal neurologic deficits are appreciated. No gait instability. Skin:  Skin is warm, dry and intact. No rash noted. Psychiatric: Mood and affect are normal. Speech and behavior are normal.  ____________________________________________   LABS (all labs ordered are listed, but only abnormal results are displayed)  Labs Reviewed - No data to display ____________________________________________   RADIOLOGY  Chronic bronchitic findings no acute changes. By radiologist few by  myself. ____________________________________________   PROCEDURES  Procedure(s) performed: None  Critical Care performed: No  ____________________________________________   INITIAL IMPRESSION / ASSESSMENT AND PLAN / ED COURSE  Pertinent labs & imaging results that were available during my care of the patient were reviewed by me and considered in my medical decision making (see chart for details).  Acute exacerbation of asthma with chronic bronchitis. Rx given for Zithromax Z-Pak, Robitussin-AC for cough. Albuterol inhaler prescribed. Patient follow-up with PCP or return to the ER with any worsening symptomology. Patient voices no other emergency medical complaints at this visit. ____________________________________________   FINAL CLINICAL IMPRESSION(S) / ED DIAGNOSES  Final diagnoses:  Chronic bronchitis with acute exacerbation  Asthma, mild intermittent, with acute exacerbation      Arlyss Repress, PA-C 03/30/15 1841  Ahmed Prima, MD 03/30/15 [phone]

## 2015-04-09 ENCOUNTER — Ambulatory Visit: Payer: Self-pay

## 2015-04-15 ENCOUNTER — Ambulatory Visit: Payer: Self-pay

## 2015-04-19 ENCOUNTER — Emergency Department
Admission: EM | Admit: 2015-04-19 | Discharge: 2015-04-19 | Disposition: A | Discharge status: Home / Self Care | Payer: Self-pay | Attending: Emergency Medicine | Admitting: Emergency Medicine

## 2015-04-19 ENCOUNTER — Emergency Department: Payer: Self-pay

## 2015-04-19 ENCOUNTER — Encounter: Payer: Self-pay | Admitting: *Deleted

## 2015-04-19 DIAGNOSIS — J45901 Unspecified asthma with (acute) exacerbation: Secondary | ICD-10-CM | POA: Insufficient documentation

## 2015-04-19 DIAGNOSIS — R059 Cough, unspecified: Secondary | ICD-10-CM

## 2015-04-19 DIAGNOSIS — R05 Cough: Secondary | ICD-10-CM

## 2015-04-19 DIAGNOSIS — J40 Bronchitis, not specified as acute or chronic: Secondary | ICD-10-CM

## 2015-04-19 IMAGING — CR DG CHEST 2V
1 series · 2 of 2 positions shown · non-contrast
Comparison: PA and lateral chest [DATE] and [DATE]. CT
chest [DATE].

CLINICAL DATA: Productive cough and nasal congestion for several
weeks. Subsequent encounter.

EXAM:
CHEST  2 VIEW

[Series 1: w chest pa · 0.14mm/px · 2 of 2 slices shown]
[im 1/2]
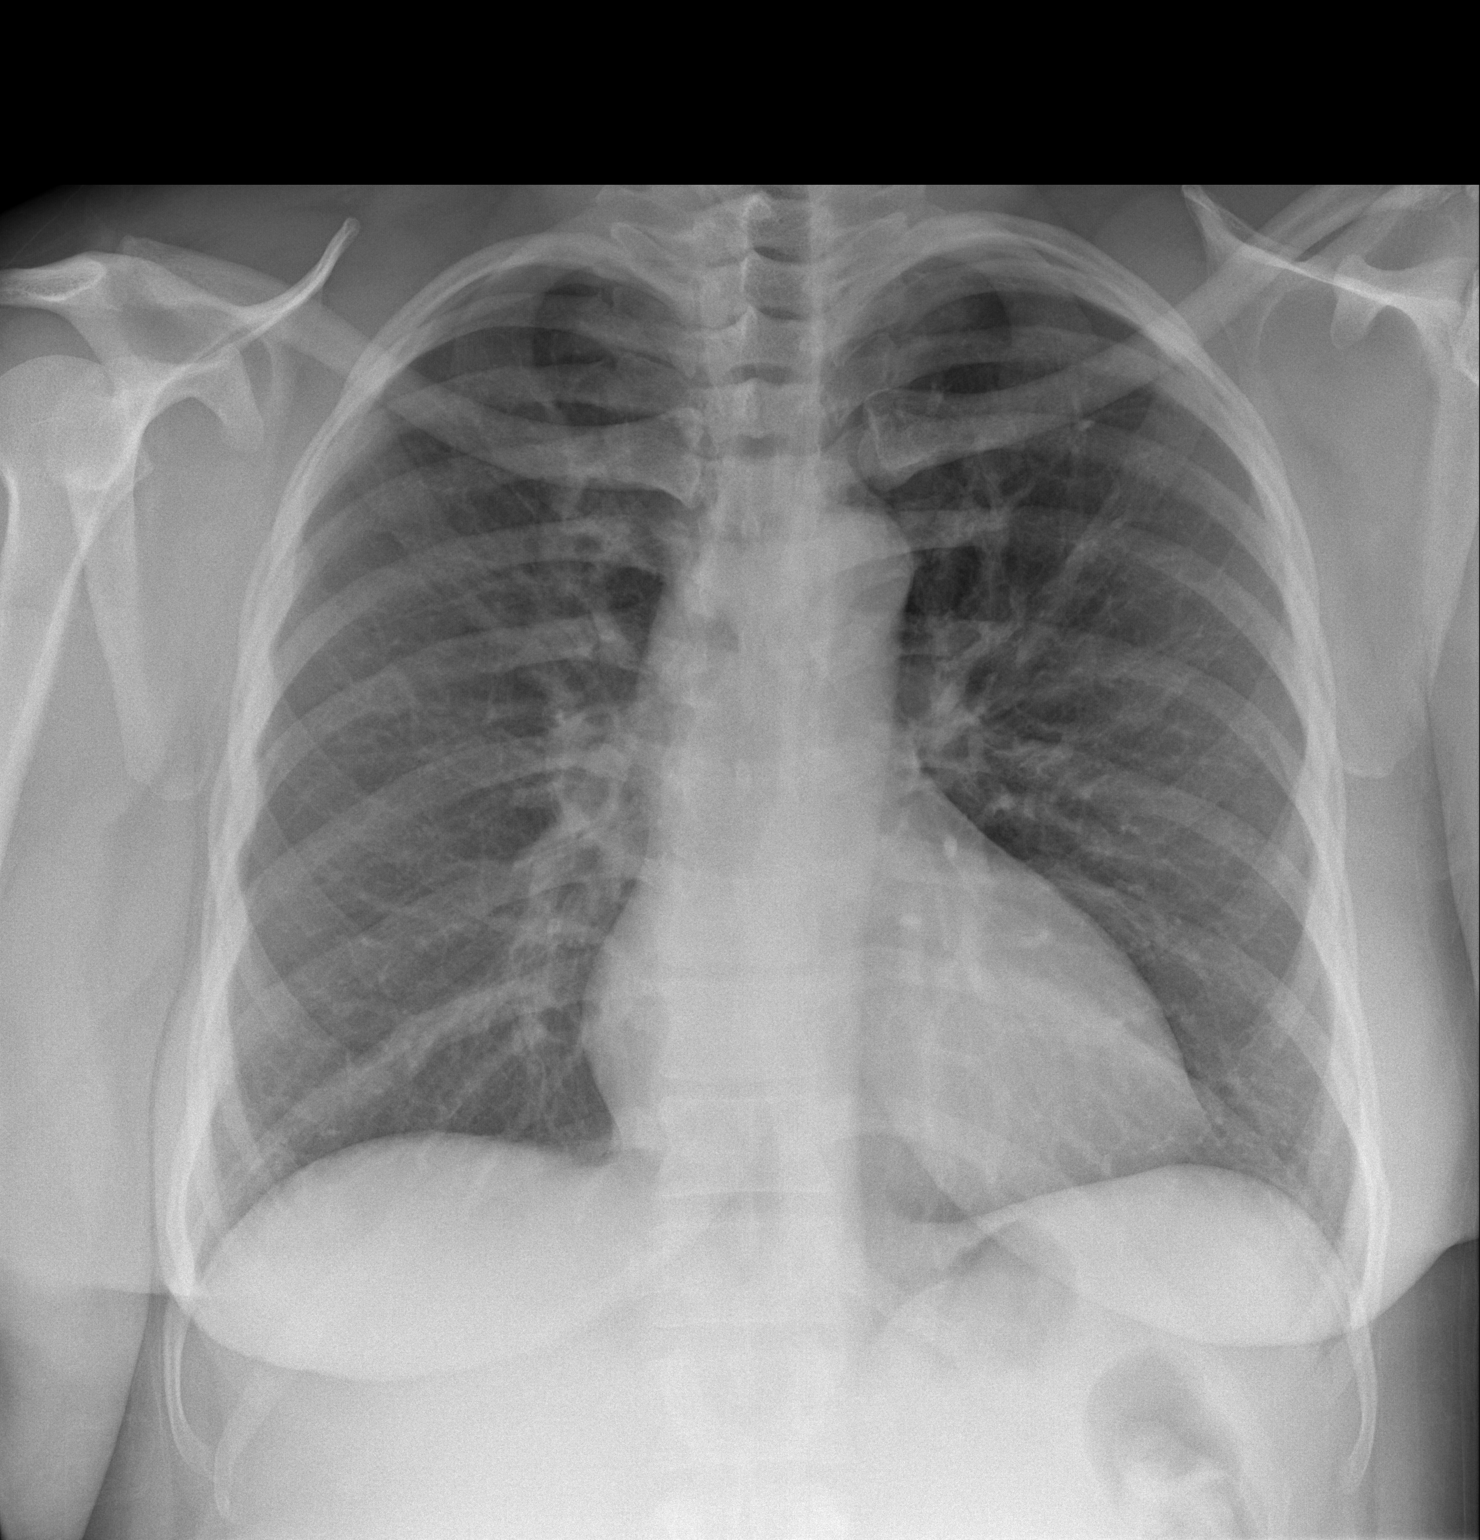
[im 2/2]
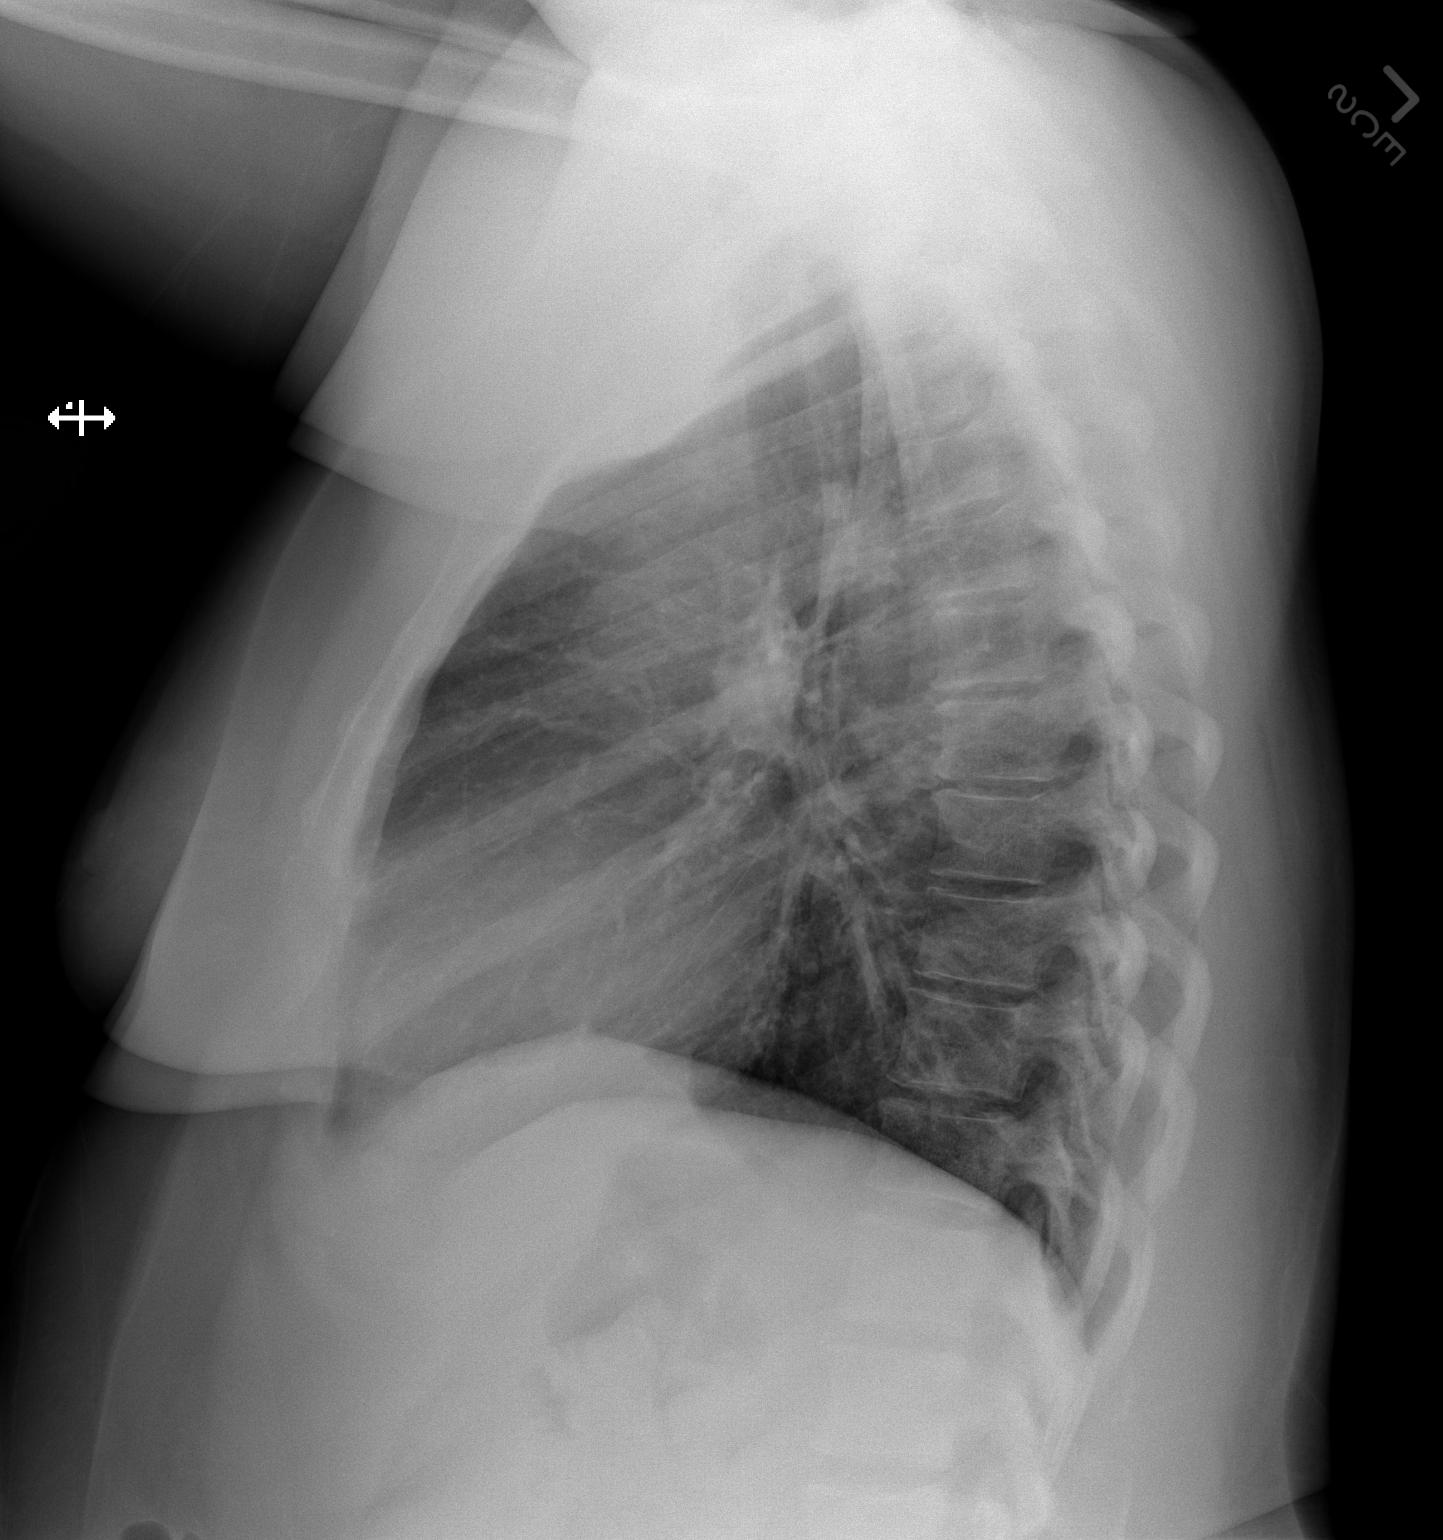

[2 of 2 positions shown; findings below may reference images not displayed]

FINDINGS: The lungs are clear. Heart size is normal. There is no pneumothorax
or pleural effusion. No focal bony abnormality is identified.
IMPRESSION: Negative chest.

## 2015-04-19 MED ORDER — METHYLPREDNISOLONE SODIUM SUCC 125 MG IJ SOLR
125.0000 mg | Freq: Once | INTRAMUSCULAR | Status: AC
  Administered 2015-04-19: 125 mg via INTRAMUSCULAR
  Filled 2015-04-19: qty 2

## 2015-04-19 MED ORDER — IPRATROPIUM-ALBUTEROL 0.5-2.5 (3) MG/3ML IN SOLN
3.0000 mL | Freq: Once | RESPIRATORY_TRACT | Status: AC
  Administered 2015-04-19: 3 mL via RESPIRATORY_TRACT
  Filled 2015-04-19: qty 3

## 2015-04-19 MED ORDER — PREDNISONE 20 MG PO TABS: 40.0000 mg | ORAL_TABLET | Freq: Every day | ORAL | Status: DC

## 2015-04-19 NOTE — ED Provider Notes (Signed)
CSN: [id number]     Arrival date & time 04/19/15  1747 History   None    Chief Complaint  Patient presents with  . URI     (Consider location/radiation/quality/duration/timing/severity/associated sxs/prior Treatment) HPI  49 year old female presents today for evaluation of cough, wheezing, chest tightness. Patient was diagnosed with bronchitis a few weeks ago. She's had cough and congestion as well as occasional wheezing for over 6 weeks. A few weeks ago she was seen in the emergency department, chest x-rays were negative. She was treated with albuterol inhaler and cough medication as well as azithromycin. Patient did not seem to improve much. Few days ago she developed increased wheezing and chest tightness that was requiring frequent albuterol treatments. Patient states the chest pain is mild, she describes it as tightness, no radiation. Pain is located throughout the entire chest and is increased with deep breath. Chest pain is relieved with albuterol. She denies any fevers or productive cough.  Past Medical History  Diagnosis Date  . Asthma    Past Surgical History  Procedure Laterality Date  . Tubal ligation     No family history on file. Social History  Substance Use Topics  . Smoking status: Never Smoker   . Smokeless tobacco: Never Used  . Alcohol Use: No   OB History    Gravida Para Term Preterm AB TAB SAB Ectopic Multiple Living   4         4     Review of Systems  Constitutional: Negative for fever, chills, activity change and fatigue.  HENT: Negative for congestion, sinus pressure and sore throat.   Eyes: Negative for visual disturbance.  Respiratory: Positive for cough, shortness of breath and wheezing. Negative for chest tightness.   Cardiovascular: Negative for chest pain and leg swelling.  Gastrointestinal: Negative for nausea, vomiting, abdominal pain and diarrhea.  Genitourinary: Negative for dysuria.  Musculoskeletal: Negative for arthralgias and gait  problem.  Skin: Negative for rash.  Neurological: Negative for weakness, numbness and headaches.  Hematological: Negative for adenopathy.  Psychiatric/Behavioral: Negative for behavioral problems, confusion and agitation.      Allergies  Review of patient's allergies indicates no known allergies.  Home Medications   Prior to Admission medications   Medication Sig Start Date End Date Taking? Authorizing Provider  albuterol (PROVENTIL HFA;VENTOLIN HFA) 108 (90 BASE) MCG/ACT inhaler Inhale 2 puffs into the lungs every 6 (six) hours as needed for wheezing or shortness of breath. 03/30/15   Pierce Crane Beers, PA-C  azithromycin (ZITHROMAX Z-PAK) 250 MG tablet Take 2 tablets (500 mg) on  Day 1,  followed by 1 tablet (250 mg) once daily on Days 2 through 5. 03/30/15   Arlyss Repress, PA-C  guaiFENesin-codeine 100-10 MG/5ML syrup Take 10 mLs by mouth every 4 (four) hours as needed for cough. 03/30/15   Pierce Crane Beers, PA-C  predniSONE (DELTASONE) 20 MG tablet Take 2 tablets (40 mg total) by mouth daily. 04/19/15   Duanne Guess, PA-C   BP 176/89 mmHg  Pulse 89  Temp(Src) 98.1 F (36.7 C) (Oral)  Ht 5\' 3"  (1.6 m)  Wt 174 lb (78.926 kg)  BMI 30.83 kg/m2  SpO2 96% Physical Exam  Constitutional: She is oriented to person, place, and time. She appears well-developed and well-nourished. No distress.  HENT:  Head: Normocephalic and atraumatic.  Mouth/Throat: Oropharynx is clear and moist.  Eyes: EOM are normal. Pupils are equal, round, and reactive to light. Right eye exhibits no discharge. Left eye  exhibits no discharge.  Neck: Normal range of motion. Neck supple. No tracheal deviation present.  Cardiovascular: Normal rate, regular rhythm and intact distal pulses.   Pulmonary/Chest: Effort normal. No respiratory distress. She has wheezes. She has no rales. She exhibits no tenderness.  Abdominal: Soft. She exhibits no distension. There is no tenderness.  Musculoskeletal: Normal range of motion.  She exhibits no edema.  Lymphadenopathy:    She has no cervical adenopathy.  Neurological: She is alert and oriented to person, place, and time. She has normal reflexes.  Skin: Skin is warm and dry.  Psychiatric: She has a normal mood and affect. Her behavior is normal. Thought content normal.    ED Course  Procedures (including critical care time) Labs Review Labs Reviewed - No data to display  Imaging Review Dg Chest 2 View  04/19/2015   CLINICAL DATA:  Productive cough and nasal congestion for several weeks. Subsequent encounter.  EXAM: CHEST  2 VIEW  COMPARISON:  PA and lateral chest 03/30/2015 and 11/12/2009. CT chest 05/09/2006.  FINDINGS: The lungs are clear. Heart size is normal. There is no pneumothorax or pleural effusion. No focal bony abnormality is identified.  IMPRESSION: Negative chest.   Electronically Signed   By: Inge Rise M.D.   On: 04/19/2015 18:39   I have personally reviewed and evaluated these images and lab results as part of my medical decision-making.   EKG Interpretation None      MDM   Final diagnoses:  Cough  Bronchitis    49 year old female with persistent cough and wheezing. Chest x-ray was negative for any acute cardiopulmonary process. Initial exam showed patient was wheezing and had decrease in expiratory airflow. After Solu-Medrol 125 mg, DuoNeb 2, patient states she felt 95% better. Follow-up exam showed significant improvement of air movement without any wheezing. Chest tightness had resolved. Patient stable and ready for discharge to home. Patient will continue with albuterol inhaler. She has refills of the inhaler at home. She is given a prescription for prednisone 40 mg daily for 5 days. She'll follow-up with primary care doctor in 5-6 days if no relief.    Duanne Guess, PA-C 04/19/15 2039  Lisa Roca, MD 05/02/15 [phone]

## 2015-04-19 NOTE — Discharge Instructions (Signed)
Upper Respiratory Infection, Adult An upper respiratory infection (URI) is also known as the common cold. It is often caused by a type of germ (virus). Colds are easily spread (contagious). You can pass it to others by kissing, coughing, sneezing, or drinking out of the same glass. Usually, you get better in 1 or 2 weeks.  HOME CARE   Only take medicine as told by your doctor.  Use a warm mist humidifier or breathe in steam from a hot shower.  Drink enough water and fluids to keep your pee (urine) clear or pale yellow.  Get plenty of rest.  Return to work when your temperature is back to normal or as told by your doctor. You may use a face mask and wash your hands to stop your cold from spreading. GET HELP RIGHT AWAY IF:   After the first few days, you feel you are getting worse.  You have questions about your medicine.  You have chills, shortness of breath, or brown or red spit (mucus).  You have yellow or brown snot (nasal discharge) or pain in the face, especially when you bend forward.  You have a fever, puffy (swollen) neck, pain when you swallow, or white spots in the back of your throat.  You have a bad headache, ear pain, sinus pain, or chest pain.  You have a high-pitched whistling sound when you breathe in and out (wheezing).  You have a lasting cough or cough up blood.  You have sore muscles or a stiff neck. MAKE SURE YOU:   Understand these instructions.  Will watch your condition.  Will get help right away if you are not doing well or get worse. Document Released: 02/03/2008 Document Revised: 11/09/2011 Document Reviewed: 11/22/2013 [employer] Patient Information 2015 [employer]. This information is not intended to replace advice given to you by your health care provider. Make sure you discuss any questions you have with your health care provider.  

## 2015-04-19 NOTE — ED Notes (Signed)
Patient states she was seen here 3 weeks ago for same symptoms that resolved slightly with medications given. Patient states that over the last two days symptoms reappeared with a productive cough, nasal congestion. Patient states she had to use her inhaler more often for wheezing. Patient has a hx. Of asthma.

## 2016-06-25 ENCOUNTER — Encounter: Payer: Self-pay | Admitting: Emergency Medicine

## 2016-06-25 ENCOUNTER — Emergency Department
Admission: EM | Admit: 2016-06-25 | Discharge: 2016-06-25 | Disposition: A | Discharge status: Home / Self Care | Payer: Self-pay | Attending: Emergency Medicine | Admitting: Emergency Medicine

## 2016-06-25 DIAGNOSIS — Z79899 Other long term (current) drug therapy: Secondary | ICD-10-CM | POA: Insufficient documentation

## 2016-06-25 DIAGNOSIS — G8929 Other chronic pain: Secondary | ICD-10-CM | POA: Insufficient documentation

## 2016-06-25 DIAGNOSIS — J45909 Unspecified asthma, uncomplicated: Secondary | ICD-10-CM | POA: Insufficient documentation

## 2016-06-25 DIAGNOSIS — M545 Low back pain: Secondary | ICD-10-CM | POA: Insufficient documentation

## 2016-06-25 DIAGNOSIS — J069 Acute upper respiratory infection, unspecified: Secondary | ICD-10-CM | POA: Insufficient documentation

## 2016-06-25 DIAGNOSIS — R030 Elevated blood-pressure reading, without diagnosis of hypertension: Secondary | ICD-10-CM | POA: Insufficient documentation

## 2016-06-25 MED ORDER — IBUPROFEN 600 MG PO TABS: 600.0000 mg | ORAL_TABLET | Freq: Three times a day (TID) | ORAL | 0 refills | Status: DC | PRN

## 2016-06-25 NOTE — ED Triage Notes (Signed)
Pt to ed with c/o lower back pain x 2 years.  Pt also reports "I have had a cold for the past few days"  Pt reports cough, congestion, earache and sore throat x 3 days.

## 2016-06-25 NOTE — Discharge Instructions (Signed)
Follow-up with one of the clinics listed on your paperwork. They are either free or discharge on the sliding scale fee. You need to have your blood pressure rechecked by one of these clinics. Today her blood pressure was elevated. The last reading was 155/92. Obtain over-the-counter Delsym for cough. Begin ibuprofen 600 mg 3 times a day with food.

## 2016-06-25 NOTE — ED Provider Notes (Signed)
[facility] Emergency Department Provider Note   ____________________________________________   First MD Initiated Contact with Patient 06/25/16 1006     (approximate)  I have reviewed the triage vital signs and the nursing notes.   HISTORY  Chief Complaint Back Pain   HPI ISAI ATANACIO is a 50 y.o. female today with actually 2 complaints. Patient states that she has had low back pain for 2 weeks years and has not seen any one for her pain. Patient states she's been taking ibuprofen 200 mg every 4 hours when she is having pain. She denies any urinary symptoms, incontinence of bowel or bladder, or difficulty walking. Patient also is here because she has "a cold for the last few days". Patient states she has a nonproductive cough, nasal congestion and sore throat for 3 days.Patient has not taken any medication over-the-counter for her upper respiratory symptoms. Patient is also unaware that her blood pressure was elevated today. Currently she rates her pain as a 10 over 10.   Past Medical History:  Diagnosis Date  . Asthma     There are no active problems to display for this patient.   Past Surgical History:  Procedure Laterality Date  . TUBAL LIGATION      Prior to Admission medications   Medication Sig Start Date End Date Taking? Authorizing Provider  albuterol (PROVENTIL HFA;VENTOLIN HFA) 108 (90 BASE) MCG/ACT inhaler Inhale 2 puffs into the lungs every 6 (six) hours as needed for wheezing or shortness of breath. 03/30/15   Pierce Crane Beers, PA-C  azithromycin (ZITHROMAX Z-PAK) 250 MG tablet Take 2 tablets (500 mg) on  Day 1,  followed by 1 tablet (250 mg) once daily on Days 2 through 5. 03/30/15   Arlyss Repress, PA-C  guaiFENesin-codeine 100-10 MG/5ML syrup Take 10 mLs by mouth every 4 (four) hours as needed for cough. 03/30/15   Pierce Crane Beers, PA-C  ibuprofen (ADVIL,MOTRIN) 600 MG tablet Take 1 tablet (600 mg total) by mouth every 8 (eight) hours  as needed. 06/25/16   Johnn Hai, PA-C  predniSONE (DELTASONE) 20 MG tablet Take 2 tablets (40 mg total) by mouth daily. 04/19/15   Duanne Guess, PA-C    Allergies Review of patient's allergies indicates no known allergies.  History reviewed. No pertinent family history.  Social History Social History  Substance Use Topics  . Smoking status: Never Smoker  . Smokeless tobacco: Never Used  . Alcohol use No    Review of Systems Constitutional: No fever/chills Eyes: No visual changes. ENT: Positive sore throat.  Positive nasal congestion. Positive ear pain. Cardiovascular: Denies chest pain. Respiratory: Denies shortness of breath. Positive nonproductive cough. Gastrointestinal: No abdominal pain.  No nausea, no vomiting.  No diarrhea.  No constipation. Genitourinary: Negative for dysuria. Musculoskeletal: Positive for low back pain. Skin: Negative for rash. Neurological: Negative for headaches, focal weakness or numbness. 10-point ROS otherwise negative.  ____________________________________________   PHYSICAL EXAM:  VITAL SIGNS: ED Triage Vitals  Enc Vitals Group     BP 06/25/16 0913 (!) 176/97     Pulse Rate 06/25/16 0913 84     Resp 06/25/16 0913 18     Temp 06/25/16 0913 97.5 F (36.4 C)     Temp Source 06/25/16 0913 Oral     SpO2 06/25/16 0913 100 %     Weight 06/25/16 0914 174 lb (78.9 kg)     Height --      Head Circumference --  Peak Flow --      Pain Score 06/25/16 0914 10     Pain Loc --      Pain Edu? --      Excl. in [place]? --     Constitutional: Alert and oriented. Well appearing and in no acute distress. Eyes: Conjunctivae are normal. PERRL. EOMI. Head: Atraumatic. Nose: Moderate congestion/rhinnorhea.  EACs bilaterally are obstructed with cerumen. Mouth/Throat: Mucous membranes are moist.  Oropharynx non-erythematous. Posterior drainage. Neck: No stridor.   Hematological/Lymphatic/Immunilogical: No cervical  lymphadenopathy. Cardiovascular: Normal rate, regular rhythm. Grossly normal heart sounds.  Good peripheral circulation. Respiratory: Normal respiratory effort.  No retractions. Lungs CTAB. Gastrointestinal: Soft and nontender. No distention.   Musculoskeletal: On examination of the lower back there is no gross deformity. There is minimal tenderness on palpation of the spinous process however there is moderate tenderness on palpation of the paravertebral muscles bilaterally. Range of motion is unrestricted no active muscle spasms were seen. Straight leg raises were negative. Neurologic:  Normal speech and language. No gross focal neurologic deficits are appreciated. Reflexes are 2+ bilaterally. No gait instability. Skin:  Skin is warm, dry and intact. No rash noted. Psychiatric: Mood and affect are normal. Speech and behavior are normal.  ____________________________________________   LABS (all labs ordered are listed, but only abnormal results are displayed)  Labs Reviewed - No data to display  PROCEDURES  Procedure(s) performed: None  Procedures  Critical Care performed: No  ____________________________________________   INITIAL IMPRESSION / ASSESSMENT AND PLAN / ED COURSE  Pertinent labs & imaging results that were available during my care of the patient were reviewed by me and considered in my medical decision making (see chart for details).    Clinical Course  And she was given a prescription for ibuprofen 600 mg 3 times a day with food. Patient is encouraged to follow-up with her primary care doctor of her choice. There was multiple health centers listed on her discharge papers for her to find a primary care doctor. Patient was encouraged to use over-the-counter medication for her upper respiratory infection. She also has a mildly elevated blood pressure today and should have that rechecked at one of the clinics that she  chooses.   ____________________________________________   FINAL CLINICAL IMPRESSION(S) / ED DIAGNOSES  Final diagnoses:  Acute upper respiratory infection  Chronic bilateral low back pain without sciatica  Elevated blood pressure reading      NEW MEDICATIONS STARTED DURING THIS VISIT:  Discharge Medication List as of 06/25/2016 10:54 AM    START taking these medications   Details  ibuprofen (ADVIL,MOTRIN) 600 MG tablet Take 1 tablet (600 mg total) by mouth every 8 (eight) hours as needed., Starting Thu 06/25/2016, Print         Note:  This document was prepared using Dragon voice recognition software and may include unintentional dictation errors.    Johnn Hai, PA-C 06/25/16 1847    Carrie Mew, MD 06/27/16 2031

## 2016-08-02 ENCOUNTER — Emergency Department
Admission: EM | Admit: 2016-08-02 | Discharge: 2016-08-03 | Disposition: A | Discharge status: Home / Self Care | Payer: Self-pay | Attending: Emergency Medicine | Admitting: Emergency Medicine

## 2016-08-02 ENCOUNTER — Encounter: Payer: Self-pay | Admitting: Emergency Medicine

## 2016-08-02 DIAGNOSIS — M5431 Sciatica, right side: Secondary | ICD-10-CM | POA: Insufficient documentation

## 2016-08-02 DIAGNOSIS — J45909 Unspecified asthma, uncomplicated: Secondary | ICD-10-CM | POA: Insufficient documentation

## 2016-08-02 DIAGNOSIS — R519 Headache, unspecified: Secondary | ICD-10-CM

## 2016-08-02 DIAGNOSIS — Z79899 Other long term (current) drug therapy: Secondary | ICD-10-CM | POA: Insufficient documentation

## 2016-08-02 DIAGNOSIS — R51 Headache: Secondary | ICD-10-CM | POA: Insufficient documentation

## 2016-08-02 NOTE — ED Triage Notes (Signed)
EMS pt to rm 7 from home. Pt reports she has a headache since around 2 pm that has gotten worse this evening. Pt talking in full and complete sentences with no difficulty at this time.

## 2016-08-03 LAB — CBC
HCT: 37.7 % (ref 35.0–47.0)
Hemoglobin: 13 g/dL (ref 12.0–16.0)
MCH: 28.9 pg (ref 26.0–34.0)
MCHC: 34.6 g/dL (ref 32.0–36.0)
MCV: 83.7 fL (ref 80.0–100.0)
PLATELETS: 253 10*3/uL (ref 150–440)
RBC: 4.51 MIL/uL (ref 3.80–5.20)
RDW: 13.6 % (ref 11.5–14.5)
WBC: 10.5 10*3/uL (ref 3.6–11.0)

## 2016-08-03 LAB — BASIC METABOLIC PANEL
Anion gap: 8 (ref 5–15)
BUN: 12 mg/dL (ref 6–20)
CALCIUM: 9.1 mg/dL (ref 8.9–10.3)
CO2: 26 mmol/L (ref 22–32)
CREATININE: 0.89 mg/dL (ref 0.44–1.00)
Chloride: 105 mmol/L (ref 101–111)
GFR calc Af Amer: >60 mL/min (ref >60)
GLUCOSE: 98 mg/dL (ref 65–99)
Potassium: 3.4 mmol/L — ABNORMAL LOW (ref 3.5–5.1)
Sodium: 139 mmol/L (ref 135–145)

## 2016-08-03 MED ORDER — CYCLOBENZAPRINE HCL 10 MG PO TABS: 10.0000 mg | ORAL_TABLET | Freq: Three times a day (TID) | ORAL | 0 refills | Status: DC | PRN

## 2016-08-03 MED ORDER — LIDOCAINE 5 % EX PTCH
1.0000 | MEDICATED_PATCH | CUTANEOUS | Status: DC
  Administered 2016-08-03: 1 via TRANSDERMAL
  Filled 2016-08-03: qty 1

## 2016-08-03 MED ORDER — LIDOCAINE 5 % EX PTCH: 1.0000 | MEDICATED_PATCH | Freq: Two times a day (BID) | CUTANEOUS | 0 refills | Status: AC

## 2016-08-03 MED ORDER — DIPHENHYDRAMINE HCL 50 MG/ML IJ SOLN
25.0000 mg | Freq: Once | INTRAMUSCULAR | Status: AC
  Administered 2016-08-03: 25 mg via INTRAVENOUS
  Filled 2016-08-03: qty 1

## 2016-08-03 MED ORDER — METOCLOPRAMIDE HCL 5 MG/ML IJ SOLN
10.0000 mg | Freq: Once | INTRAMUSCULAR | Status: AC
Start: 2016-08-03 — End: 2016-08-03
  Administered 2016-08-03: 10 mg via INTRAVENOUS
  Filled 2016-08-03: qty 2

## 2016-08-03 MED ORDER — SODIUM CHLORIDE 0.9 % IV BOLUS (SEPSIS)
1000.0000 mL | Freq: Once | INTRAVENOUS | Status: AC
Start: 2016-08-03 — End: 2016-08-03
  Administered 2016-08-03: 1000 mL via INTRAVENOUS

## 2016-08-03 MED ORDER — KETOROLAC TROMETHAMINE 30 MG/ML IJ SOLN
30.0000 mg | Freq: Once | INTRAMUSCULAR | Status: AC
  Administered 2016-08-03: 30 mg via INTRAVENOUS
  Filled 2016-08-03: qty 1

## 2016-08-03 NOTE — Discharge Instructions (Signed)
[facility]. You may also have some chronic high blood pressure. The blood pressure needs to be checked regularly to determine if you need to be placed on medications. Please return to the ER with any further complaints or concerns.

## 2016-08-03 NOTE — ED Provider Notes (Signed)
[facility] Emergency Department Provider Note   ____________________________________________   First MD Initiated Contact with Patient 08/02/16 2345     (approximate)  I have reviewed the triage vital signs and the nursing notes.   HISTORY  Chief Complaint Multiple Medical Complaints    HPI Samantha Deleon is a 50 y.o. female who comes into the hospital today with a headache. The patient reports that it started around 2 PM. The patient reports that she normally gets a headache whenever she has pain in her leg and her back. She reports that she's had this pain in her leg and back for some time but has been getting worse and worse. She reports that she normally comes into the hospital for this pain. She reports that it starts in her low back and seems to wrap around her legs into her groin and stops around her knee. The patient reports that she has not taken anything for pain today. She called the ambulance and her blood pressure was elevated. She reports that her pain in her leg is about a 10 out of 10 in intensity currently that her headache is improved with 6 out of 10 in intensity. The patient does not have a physician so she has not followed for this pain. She denies any blurred vision numbness or tingling. She also denies chest pain, shortness of breath, nausea, vomiting, lightheadedness or dizziness. The patient is here today for treatment and evaluation of these symptoms.   Past Medical History:  Diagnosis Date  . Asthma     There are no active problems to display for this patient.   Past Surgical History:  Procedure Laterality Date  . TUBAL LIGATION      Prior to Admission medications   Medication Sig Start Date End Date Taking? Authorizing Provider  albuterol (PROVENTIL HFA;VENTOLIN HFA) 108 (90 BASE) MCG/ACT inhaler Inhale 2 puffs into the lungs every 6 (six) hours as needed for wheezing or shortness of breath. 03/30/15   Pierce Crane Beers, PA-C    azithromycin (ZITHROMAX Z-PAK) 250 MG tablet Take 2 tablets (500 mg) on  Day 1,  followed by 1 tablet (250 mg) once daily on Days 2 through 5. 03/30/15   Arlyss Repress, PA-C  cyclobenzaprine (FLEXERIL) 10 MG tablet Take 1 tablet (10 mg total) by mouth 3 (three) times daily as needed for muscle spasms. 08/03/16   Loney Hering, MD  guaiFENesin-codeine 100-10 MG/5ML syrup Take 10 mLs by mouth every 4 (four) hours as needed for cough. 03/30/15   Pierce Crane Beers, PA-C  ibuprofen (ADVIL,MOTRIN) 600 MG tablet Take 1 tablet (600 mg total) by mouth every 8 (eight) hours as needed. 06/25/16   Johnn Hai, PA-C  lidocaine (LIDODERM) 5 % Place 1 patch onto the skin every 12 (twelve) hours. Remove & Discard patch within 12 hours or as directed by MD 08/03/16 08/03/17  Loney Hering, MD  predniSONE (DELTASONE) 20 MG tablet Take 2 tablets (40 mg total) by mouth daily. 04/19/15   Duanne Guess, PA-C    Allergies Patient has no known allergies.  History reviewed. No pertinent family history.  Social History Social History  Substance Use Topics  . Smoking status: Never Smoker  . Smokeless tobacco: Never Used  . Alcohol use No    Review of Systems Constitutional: No fever/chills Eyes: No visual changes. ENT: No sore throat. Cardiovascular: Denies chest pain. Respiratory: Denies shortness of breath. Gastrointestinal: No abdominal pain.  No nausea,  no vomiting.  No diarrhea.  No constipation. Genitourinary: Negative for dysuria. Musculoskeletal: back pain, leg pain Skin: Negative for rash. Neurological: Headache  10-point ROS otherwise negative.  ____________________________________________   PHYSICAL EXAM:  VITAL SIGNS: ED Triage Vitals  Enc Vitals Group     BP 08/02/16 2314 (!) 182/99     Pulse Rate 08/02/16 2314 89     Resp 08/02/16 2314 18     Temp 08/02/16 2314 98.1 F (36.7 C)     Temp Source 08/02/16 2314 Oral     SpO2 08/02/16 2314 98 %     Weight 08/02/16 2314 175  lb (79.4 kg)     Height 08/02/16 2314 5\' 4"  (1.626 m)     Head Circumference --      Peak Flow --      Pain Score 08/02/16 2315 9     Pain Loc --      Pain Edu? --      Excl. in [place]? --     Constitutional: Alert and oriented. Well appearing and in Moderate distress. Eyes: Conjunctivae are normal. PERRL. EOMI. Head: Atraumatic. Nose: No congestion/rhinnorhea. Mouth/Throat: Mucous membranes are moist.  Oropharynx non-erythematous. Cardiovascular: Normal rate, regular rhythm. Grossly normal heart sounds.  Good peripheral circulation. Respiratory: Normal respiratory effort.  No retractions. Lungs CTAB. Gastrointestinal: Soft and nontender. No distention. Positive bowel sounds Musculoskeletal: Tenderness to palpation of right SI joint with straight leg raise positive bilaterally. Neurologic:  Normal speech and language. No gross focal neurologic deficits are appreciated. No gait instability. Skin:  Skin is warm, dry and intact.  Psychiatric: Mood and affect are normal.   ____________________________________________   LABS (all labs ordered are listed, but only abnormal results are displayed)  Labs Reviewed  BASIC METABOLIC PANEL - Abnormal; Notable for the following:       Result Value   Potassium 3.4 (*)    All other components within normal limits  CBC   ____________________________________________  EKG none ____________________________________________  RADIOLOGY  none ____________________________________________   PROCEDURES  Procedure(s) performed: None  Procedures  Critical Care performed: No  ____________________________________________   INITIAL IMPRESSION / ASSESSMENT AND PLAN / ED COURSE  Pertinent labs & imaging results that were available during my care of the patient were reviewed by me and considered in my medical decision making (see chart for details).  This is a 50 year old female who comes into the hospital today with a headache and some leg  and back pain. The patient reports that she normally gets headaches like this whenever her leg pain is bothering her. The patient has not taken anything for pain. I will give the patient some Reglan, Benadryl, Toradol and a liter of normal saline. I will also place a Lidoderm patch to the patient's back and reassess her symptoms. As the patient reports that she's had these symptoms before and it is not the worst I will not do any imaging at this time.  Clinical Course    The patient reports that after the medication she feels much improved. The patient's blood pressure is also improved at 115/65. I have encouraged the [facility] as she does not have insurance. Otherwise the patient will be discharged home to follow-up. The patient has no further questions or concerns.  ____________________________________________   FINAL CLINICAL IMPRESSION(S) / ED DIAGNOSES  Final diagnoses:  Acute nonintractable headache, unspecified headache type  Sciatica of right side  NEW MEDICATIONS STARTED DURING THIS VISIT:  New Prescriptions   CYCLOBENZAPRINE (FLEXERIL) 10 MG TABLET    Take 1 tablet (10 mg total) by mouth 3 (three) times daily as needed for muscle spasms.   LIDOCAINE (LIDODERM) 5 %    Place 1 patch onto the skin every 12 (twelve) hours. Remove & Discard patch within 12 hours or as directed by MD     Note:  This document was prepared using Dragon voice recognition software and may include unintentional dictation errors.    Loney Hering, MD 08/03/16 [phone]

## 2017-05-23 ENCOUNTER — Emergency Department
Admission: EM | Admit: 2017-05-23 | Discharge: 2017-05-23 | Disposition: A | Discharge status: Home / Self Care | Payer: Self-pay | Attending: Emergency Medicine | Admitting: Emergency Medicine

## 2017-05-23 DIAGNOSIS — S61210A Laceration without foreign body of right index finger without damage to nail, initial encounter: Secondary | ICD-10-CM | POA: Insufficient documentation

## 2017-05-23 DIAGNOSIS — Y998 Other external cause status: Secondary | ICD-10-CM | POA: Insufficient documentation

## 2017-05-23 DIAGNOSIS — S61212A Laceration without foreign body of right middle finger without damage to nail, initial encounter: Secondary | ICD-10-CM | POA: Insufficient documentation

## 2017-05-23 DIAGNOSIS — T07XXXA Unspecified multiple injuries, initial encounter: Secondary | ICD-10-CM | POA: Insufficient documentation

## 2017-05-23 DIAGNOSIS — Y9389 Activity, other specified: Secondary | ICD-10-CM | POA: Insufficient documentation

## 2017-05-23 DIAGNOSIS — Y929 Unspecified place or not applicable: Secondary | ICD-10-CM | POA: Insufficient documentation

## 2017-05-23 DIAGNOSIS — T148XXA Other injury of unspecified body region, initial encounter: Secondary | ICD-10-CM

## 2017-05-23 DIAGNOSIS — S0093XA Contusion of unspecified part of head, initial encounter: Secondary | ICD-10-CM | POA: Insufficient documentation

## 2017-05-23 DIAGNOSIS — S0003XA Contusion of scalp, initial encounter: Secondary | ICD-10-CM | POA: Insufficient documentation

## 2017-05-23 HISTORY — DX: Essential (primary) hypertension: I10

## 2017-05-23 MED ORDER — ACETAMINOPHEN 500 MG PO TABS
1000.0000 mg | ORAL_TABLET | Freq: Once | ORAL | Status: AC
  Administered 2017-05-23: 1000 mg via ORAL
  Filled 2017-05-23: qty 2

## 2017-05-23 MED ORDER — LIDOCAINE HCL (PF) 1 % IJ SOLN
INTRAMUSCULAR | Status: AC
  Filled 2017-05-23: qty 5

## 2017-05-23 MED ORDER — CEPHALEXIN 500 MG PO CAPS: 500.0000 mg | ORAL_CAPSULE | Freq: Two times a day (BID) | ORAL | 0 refills | Status: AC

## 2017-05-23 MED ORDER — CEPHALEXIN 500 MG PO CAPS
500.0000 mg | ORAL_CAPSULE | Freq: Once | ORAL | Status: AC
  Administered 2017-05-23: 500 mg via ORAL
  Filled 2017-05-23: qty 1

## 2017-05-23 MED ORDER — LIDOCAINE HCL (PF) 1 % IJ SOLN
5.0000 mL | Freq: Once | INTRAMUSCULAR | Status: DC
  Filled 2017-05-23: qty 5

## 2017-05-23 MED ORDER — HYDROCODONE-ACETAMINOPHEN 5-325 MG PO TABS: 1.0000 | ORAL_TABLET | ORAL | 0 refills | Status: DC | PRN
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
## 2017-05-23 NOTE — Discharge Instructions (Signed)
Fortunately you do not have any wounds that will require you to stay in the hospital.  Keep your lacerations clean and dry, and change the dressing twice daily.  You need to have the sutures removed at an urgent care or another similar medical facility in about a week.  You have six suture on your forehead, five in your right index finger, and two in your right middle finger.  Take the prescribed antibiotics.  If you develop any redness, swelling, or discharge from any of the wounds, or if you develop any other new or worsening symptoms that concern you, please return to the Emergency Department.  Take Norco as prescribed for severe pain. Do not drink alcohol, drive or participate in any other potentially dangerous activities while taking this medication as it may make you sleepy. Do not take this medication with any other sedating medications, either prescription or over-the-counter. If you were prescribed Percocet or Vicodin, do not take these with acetaminophen (Tylenol) as it is already contained within these medications.   This medication is an opiate (or narcotic) pain medication and can be habit forming.  Use it as little as possible to achieve adequate pain control.  Do not use or use it with extreme caution if you have a history of opiate abuse or dependence.  If you are on a pain contract with your primary care doctor or a pain specialist, be sure to let them know you were prescribed this medication today from the [facility] Emergency Department.  This medication is intended for your use only - do not give any to anyone else and keep it in a secure place where nobody else, especially children, have access to it.  It will also cause or worsen constipation, so you may want to consider taking an over-the-counter stool softener while you are taking this medication.

## 2017-05-31 ENCOUNTER — Emergency Department
Admission: EM | Admit: 2017-05-31 | Discharge: 2017-05-31 | Disposition: A | Discharge status: Home / Self Care | Payer: Self-pay | Attending: Emergency Medicine | Admitting: Emergency Medicine

## 2017-05-31 ENCOUNTER — Encounter: Payer: Self-pay | Admitting: Emergency Medicine

## 2017-05-31 DIAGNOSIS — Z79899 Other long term (current) drug therapy: Secondary | ICD-10-CM | POA: Insufficient documentation

## 2017-05-31 DIAGNOSIS — X58XXXD Exposure to other specified factors, subsequent encounter: Secondary | ICD-10-CM | POA: Insufficient documentation

## 2017-05-31 DIAGNOSIS — S0181XD Laceration without foreign body of other part of head, subsequent encounter: Secondary | ICD-10-CM | POA: Insufficient documentation

## 2017-05-31 DIAGNOSIS — S61411D Laceration without foreign body of right hand, subsequent encounter: Secondary | ICD-10-CM | POA: Insufficient documentation

## 2017-05-31 DIAGNOSIS — Z4802 Encounter for removal of sutures: Secondary | ICD-10-CM

## 2017-05-31 DIAGNOSIS — I10 Essential (primary) hypertension: Secondary | ICD-10-CM | POA: Insufficient documentation

## 2017-05-31 DIAGNOSIS — J45909 Unspecified asthma, uncomplicated: Secondary | ICD-10-CM | POA: Insufficient documentation

## 2017-05-31 NOTE — ED Triage Notes (Signed)
Suture removal for forehead and right hand lacerations.  All wounds well approximated, clean and dry.

## 2017-05-31 NOTE — ED Provider Notes (Signed)
[facility] Emergency Department Provider Note  ____________________________________________  Time seen: Approximately 5:36 PM  I have reviewed the triage vital signs and the nursing notes.   HISTORY  Chief Complaint Suture / Staple Removal    HPI Samantha Deleon is a 51 y.o. female presents to the emergency department for suture removal from the left forehead and right hand. Patient reports that wounds have been healing without complication. Patient has noticed no streaking, purulent exudate or fever. She has no concerns.   Past Medical History:  Diagnosis Date  . Asthma   . Hypertension     There are no active problems to display for this patient.   Past Surgical History:  Procedure Laterality Date  . TUBAL LIGATION      Prior to Admission medications   Medication Sig Start Date End Date Taking? Authorizing Provider  albuterol (PROVENTIL HFA;VENTOLIN HFA) 108 (90 BASE) MCG/ACT inhaler Inhale 2 puffs into the lungs every 6 (six) hours as needed for wheezing or shortness of breath. 03/30/15   Beers, Pierce Crane, PA-C  azithromycin (ZITHROMAX Z-PAK) 250 MG tablet Take 2 tablets (500 mg) on  Day 1,  followed by 1 tablet (250 mg) once daily on Days 2 through 5. 03/30/15   Beers, Pierce Crane, PA-C  cyclobenzaprine (FLEXERIL) 10 MG tablet Take 1 tablet (10 mg total) by mouth 3 (three) times daily as needed for muscle spasms. 08/03/16   Loney Hering, MD  guaiFENesin-codeine 100-10 MG/5ML syrup Take 10 mLs by mouth every 4 (four) hours as needed for cough. 03/30/15   Beers, Pierce Crane, PA-C  HYDROcodone-acetaminophen (NORCO/VICODIN) 5-325 MG tablet Take 1-2 tablets by mouth every 4 (four) hours as needed for moderate pain. 05/23/17   Hinda Kehr, MD  ibuprofen (ADVIL,MOTRIN) 600 MG tablet Take 1 tablet (600 mg total) by mouth every 8 (eight) hours as needed. 06/25/16   Letitia Neri L, PA-C  lidocaine (LIDODERM) 5 % Place 1 patch onto the skin every 12  (twelve) hours. Remove & Discard patch within 12 hours or as directed by MD 08/03/16 08/03/17  Loney Hering, MD  predniSONE (DELTASONE) 20 MG tablet Take 2 tablets (40 mg total) by mouth daily. 04/19/15   Duanne Guess, PA-C    Allergies Patient has no known allergies.  No family history on file.  Social History Social History  Substance Use Topics  . Smoking status: Never Smoker  . Smokeless tobacco: Never Used  . Alcohol use No     Review of Systems  Constitutional: No fever/chills Eyes: No visual changes. No discharge ENT: No upper respiratory complaints. Cardiovascular: no chest pain. Respiratory: no cough. No SOB. Gastrointestinal: No abdominal pain.  No nausea, no vomiting.  No diarrhea.  No constipation. Musculoskeletal: Negative for musculoskeletal pain. Skin: patient has healing lacerations of left forehead and right hand.  Neurological: Negative for headaches, focal weakness or numbness.   ____________________________________________   PHYSICAL EXAM:  VITAL SIGNS: ED Triage Vitals  Enc Vitals Group     BP 05/31/17 1700 (!) 160/83     Pulse Rate 05/31/17 1700 86     Resp 05/31/17 1700 16     Temp 05/31/17 1700 98.5 F (36.9 C)     Temp Source 05/31/17 1700 Oral     SpO2 05/31/17 1700 97 %     Weight 05/31/17 1659 173 lb (78.5 kg)     Height 05/31/17 1659 5\' 5"  (1.651 m)     Head Circumference --  Peak Flow --      Pain Score --      Pain Loc --      Pain Edu? --      Excl. in [place]? --      Constitutional: Alert and oriented. Well appearing and in no acute distress. Eyes: Conjunctivae are normal. PERRL. EOMI. Head: Atraumatic.  Cardiovascular: Normal rate, regular rhythm. Normal S1 and S2.  Good peripheral circulation. Respiratory: Normal respiratory effort without tachypnea or retractions. Lungs CTAB. Good air entry to the bases with no decreased or absent breath sounds. Musculoskeletal: Full range of motion to all extremities. No gross  deformities appreciated. Neurologic:  Normal speech and language. No gross focal neurologic deficits are appreciated.  Skin:  Patient has healing lacerations of forehead and right hand. Palpable radial pulse, right.  Psychiatric: Mood and affect are normal. Speech and behavior are normal. Patient exhibits appropriate insight and judgement.   ____________________________________________   LABS (all labs ordered are listed, but only abnormal results are displayed)  Labs Reviewed - No data to display ____________________________________________  EKG   ____________________________________________  RADIOLOGY   No results found.  ____________________________________________    PROCEDURES  Procedure(s) performed:    Procedures  SUTURE REMOVAL Performed by: Lannie Fields  Consent: Verbal consent obtained. Patient identity confirmed: provided demographic data Time out: Immediately prior to procedure a "time out" was called to verify the correct patient, procedure, equipment, support staff and site/side marked as required.  Location details: Left forehead, right hand  Wound Appearance: clean  Sutures/Staples Removed: 11  Facility: sutures placed in this facility Patient tolerance: Patient tolerated the procedure well with no immediate complications.     Medications - No data to display   ____________________________________________   INITIAL IMPRESSION / ASSESSMENT AND PLAN / ED COURSE  Pertinent labs & imaging results that were available during my care of the patient were reviewed by me and considered in my medical decision making (see chart for details).  Review of the [place] CSRS was performed in accordance of the [place] prior to dispensing any controlled drugs.     Assessment and plan Suture removal Patient presents to the emergency department for suture removal. Sutures were removed without complication. All patient questions were  answered. ____________________________________________  FINAL CLINICAL IMPRESSION(S) / ED DIAGNOSES  Final diagnoses:  Visit for suture removal      NEW MEDICATIONS STARTED DURING THIS VISIT:  New Prescriptions   No medications on file        This chart was dictated using voice recognition software/Dragon. Despite best efforts to proofread, errors can occur which can change the meaning. Any change was purely unintentional.    Lannie Fields, PA-C 05/31/17 1741    Harvest Dark, MD 05/31/17 2153

## 2017-06-14 ENCOUNTER — Emergency Department
Admission: EM | Admit: 2017-06-14 | Discharge: 2017-06-14 | Disposition: A | Discharge status: Home / Self Care | Payer: Self-pay | Attending: Emergency Medicine | Admitting: Emergency Medicine

## 2017-06-14 ENCOUNTER — Encounter: Payer: Self-pay | Admitting: Emergency Medicine

## 2017-06-14 DIAGNOSIS — Z79899 Other long term (current) drug therapy: Secondary | ICD-10-CM | POA: Insufficient documentation

## 2017-06-14 DIAGNOSIS — J45909 Unspecified asthma, uncomplicated: Secondary | ICD-10-CM | POA: Insufficient documentation

## 2017-06-14 DIAGNOSIS — I10 Essential (primary) hypertension: Secondary | ICD-10-CM | POA: Insufficient documentation

## 2017-06-14 LAB — URINALYSIS, COMPLETE (UACMP) WITH MICROSCOPIC
BILIRUBIN URINE: NEGATIVE
Glucose, UA: NEGATIVE mg/dL
KETONES UR: NEGATIVE mg/dL
LEUKOCYTES UA: NEGATIVE
Nitrite: NEGATIVE
PH: 5 (ref 5.0–8.0)
Protein, ur: NEGATIVE mg/dL
Specific Gravity, Urine: 1.018 (ref 1.005–1.030)

## 2017-06-14 LAB — CBC
HEMATOCRIT: 37.3 % (ref 35.0–47.0)
HEMOGLOBIN: 12.7 g/dL (ref 12.0–16.0)
MCH: 28.7 pg (ref 26.0–34.0)
MCHC: 34 g/dL (ref 32.0–36.0)
MCV: 84.3 fL (ref 80.0–100.0)
PLATELETS: 251 10*3/uL (ref 150–440)
RBC: 4.43 MIL/uL (ref 3.80–5.20)
RDW: 13.2 % (ref 11.5–14.5)
WBC: 7.9 10*3/uL (ref 3.6–11.0)

## 2017-06-14 LAB — BASIC METABOLIC PANEL
ANION GAP: 8 (ref 5–15)
BUN: 11 mg/dL (ref 6–20)
CHLORIDE: 105 mmol/L (ref 101–111)
CO2: 26 mmol/L (ref 22–32)
CREATININE: 1.11 mg/dL — AB (ref 0.44–1.00)
Calcium: 9.4 mg/dL (ref 8.9–10.3)
GFR calc non Af Amer: 57 mL/min — ABNORMAL LOW (ref >60)
Glucose, Bld: 90 mg/dL (ref 65–99)
POTASSIUM: 3.5 mmol/L (ref 3.5–5.1)
SODIUM: 139 mmol/L (ref 135–145)

## 2017-06-14 LAB — TROPONIN I: Troponin I: <0.03 ng/mL (ref <0.03)

## 2017-06-14 MED ORDER — HYDROCHLOROTHIAZIDE 25 MG PO TABS
12.5000 mg | ORAL_TABLET | Freq: Once | ORAL | Status: AC
  Administered 2017-06-14: 12.5 mg via ORAL
  Filled 2017-06-14: qty 1

## 2017-06-14 MED ORDER — HYDROCHLOROTHIAZIDE 12.5 MG PO CAPS: 12.5000 mg | ORAL_CAPSULE | Freq: Every day | ORAL | 0 refills | Status: DC

## 2017-06-14 NOTE — ED Notes (Signed)
Pt presents with high blood pressure. States she has known it is high for a while but has not been able to get treatment. Pt states she has an upcoming appointment Nov 7 with Dr. Gorden Harms to evaluate bp. States that she has been having some lightheadedness and has acted strangely in the recent past without her knowledge. Pt alert & oriented with NAD noted.

## 2017-06-14 NOTE — ED Notes (Signed)
Pt discharged home after verbalizing understanding of discharge instructions; nad noted. 

## 2017-06-14 NOTE — ED Provider Notes (Signed)
[facility] Emergency Department Provider Note  ____________________________________________   First MD Initiated Contact with Patient 06/14/17 1514     (approximate)  I have reviewed the triage vital signs and the nursing notes.   HISTORY  Chief Complaint Hypertension   HPI Samantha Deleon is a 51 y.o. female with a history of hypertension not on any medications as well as asthma who is presenting with high blood pressure. Says that she has also felt lightheaded since this morning but this has improved throughout the day. She is denying any chest pain or shortness of breath. Says that she is a family history of stroke and hypertension. Has appointment with primary care in November.   Past Medical History:  Diagnosis Date  . Asthma   . Hypertension     There are no active problems to display for this patient.   Past Surgical History:  Procedure Laterality Date  . TUBAL LIGATION      Prior to Admission medications   Medication Sig Start Date End Date Taking? Authorizing Provider  albuterol (PROVENTIL HFA;VENTOLIN HFA) 108 (90 BASE) MCG/ACT inhaler Inhale 2 puffs into the lungs every 6 (six) hours as needed for wheezing or shortness of breath. 03/30/15   Beers, Pierce Crane, PA-C  azithromycin (ZITHROMAX Z-PAK) 250 MG tablet Take 2 tablets (500 mg) on  Day 1,  followed by 1 tablet (250 mg) once daily on Days 2 through 5. 03/30/15   Beers, Pierce Crane, PA-C  cyclobenzaprine (FLEXERIL) 10 MG tablet Take 1 tablet (10 mg total) by mouth 3 (three) times daily as needed for muscle spasms. 08/03/16   Loney Hering, MD  guaiFENesin-codeine 100-10 MG/5ML syrup Take 10 mLs by mouth every 4 (four) hours as needed for cough. 03/30/15   Beers, Pierce Crane, PA-C  HYDROcodone-acetaminophen (NORCO/VICODIN) 5-325 MG tablet Take 1-2 tablets by mouth every 4 (four) hours as needed for moderate pain. 05/23/17   Hinda Kehr, MD  ibuprofen (ADVIL,MOTRIN) 600 MG tablet Take 1  tablet (600 mg total) by mouth every 8 (eight) hours as needed. 06/25/16   Letitia Neri L, PA-C  lidocaine (LIDODERM) 5 % Place 1 patch onto the skin every 12 (twelve) hours. Remove & Discard patch within 12 hours or as directed by MD 08/03/16 08/03/17  Loney Hering, MD  predniSONE (DELTASONE) 20 MG tablet Take 2 tablets (40 mg total) by mouth daily. 04/19/15   Duanne Guess, PA-C    Allergies Patient has no known allergies.  No family history on file.  Social History Social History  Substance Use Topics  . Smoking status: Never Smoker  . Smokeless tobacco: Never Used  . Alcohol use No    Review of Systems  Constitutional: No fever/chills Eyes: No visual changes. ENT: No sore throat. Cardiovascular: Denies chest pain. Respiratory: Denies shortness of breath. Gastrointestinal: No abdominal pain.  No nausea, no vomiting.  No diarrhea.  No constipation. Genitourinary: Negative for dysuria. Musculoskeletal: Negative for back pain. Skin: Negative for rash. Neurological: Negative for headaches, focal weakness or numbness.   ____________________________________________   PHYSICAL EXAM:  VITAL SIGNS: ED Triage Vitals  Enc Vitals Group     BP 06/14/17 1500 (!) 198/98     Pulse Rate 06/14/17 1500 81     Resp 06/14/17 1500 16     Temp 06/14/17 1500 97.8 F (36.6 C)     Temp Source 06/14/17 1500 Oral     SpO2 06/14/17 1500 100 %  Weight 06/14/17 1500 173 lb (78.5 kg)     Height 06/14/17 1500 5\' 5"  (1.651 m)     Head Circumference --      Peak Flow --      Pain Score 06/14/17 1525 0     Pain Loc --      Pain Edu? --      Excl. in [place]? --     Constitutional: Alert and oriented. Well appearing and in no acute distress. Eyes: Conjunctivae are normal.  Head: Atraumatic. Nose: No congestion/rhinnorhea. Mouth/Throat: Mucous membranes are moist.  Neck: No stridor.   Cardiovascular: Normal rate, regular rhythm. Grossly normal heart sounds.   Respiratory: Normal  respiratory effort.  No retractions. Lungs CTAB. Gastrointestinal: Soft and nontender. No distention. Musculoskeletal: No lower extremity tenderness nor edema.  No joint effusions. Neurologic:  Normal speech and language. No gross focal neurologic deficits are appreciated. Skin:  Skin is warm, dry and intact. No rash noted. Psychiatric: Mood and affect are normal. Speech and behavior are normal.  ____________________________________________   LABS (all labs ordered are listed, but only abnormal results are displayed)  Labs Reviewed  BASIC METABOLIC PANEL - Abnormal; Notable for the following:       Result Value   Creatinine, Ser 1.11 (*)    GFR calc non Af Amer 57 (*)    All other components within normal limits  URINALYSIS, COMPLETE (UACMP) WITH MICROSCOPIC - Abnormal; Notable for the following:    Color, Urine YELLOW (*)    APPearance HAZY (*)    Hgb urine dipstick SMALL (*)    Bacteria, UA RARE (*)    Squamous Epithelial / LPF 0-5 (*)    All other components within normal limits  CBC  TROPONIN I  CBG MONITORING, ED   ____________________________________________  EKG  ED ECG REPORT I, Doran Stabler, the attending physician, personally viewed and interpreted this ECG.   Date: 06/14/2017  EKG Time: 1504  Rate: 75  Rhythm: normal sinus rhythm  Axis: normal  Intervals:none  ST&T Change: no ST segment elevation or depression. No abnormal T-wave inversion.  ____________________________________________  RADIOLOGY   ____________________________________________   PROCEDURES  Procedure(s) performed:   Procedures  Critical Care performed:   ____________________________________________   INITIAL IMPRESSION / ASSESSMENT AND PLAN / ED COURSE  Pertinent labs & imaging results that were available during my care of the patient were reviewed by me and considered in my medical decision making (see chart for details).  DX: Kidney failure, asymptomatic  hypertension, symptomatic hypertension, uncontrolled hypertension  As part of my medical decision making, I reviewed the following data within the [facility] Notes from prior ED visits  ----------------------------------------- 5:27 PM on 06/14/2017 -----------------------------------------  Patient was slightly elevated creatinine but otherwise reassuring lab work. The patient will be started on hydrochlorothiazide and will be following up with primary care. She already has an appointment established for several weeks which is the soonest new patient appointment that she could obtain. She denies any lightheadedness at this time. She'll be discharged home.      ____________________________________________   FINAL CLINICAL IMPRESSION(S) / ED DIAGNOSES  uncontrolled hypertension.    NEW MEDICATIONS STARTED DURING THIS VISIT:  New Prescriptions   No medications on file     Note:  This document was prepared using Dragon voice recognition software and may include unintentional dictation errors.     Orbie Pyo, MD 06/14/17 1728

## 2017-06-14 NOTE — ED Triage Notes (Addendum)
Arrives with C/O elevated blood pressure since Saturday.  Patient does not take blood pressure medication, has an appointment for November 7 th to get set up on blood pressure medicine.  States her blood pressure is always high when she has it checked.  Denies any symptoms at this time, however stated when she awoke this morning she felt dizzy and lacked energy.

## 2017-11-19 ENCOUNTER — Encounter: Payer: Self-pay | Admitting: Emergency Medicine

## 2017-11-19 ENCOUNTER — Emergency Department: Payer: Medicaid Other

## 2017-11-19 ENCOUNTER — Emergency Department
Admission: EM | Admit: 2017-11-19 | Discharge: 2017-11-19 | Disposition: A | Discharge status: Home / Self Care | Payer: Medicaid Other | Attending: Student in an Organized Health Care Education/Training Program | Admitting: Student in an Organized Health Care Education/Training Program

## 2017-11-19 ENCOUNTER — Other Ambulatory Visit: Payer: Self-pay

## 2017-11-19 DIAGNOSIS — E119 Type 2 diabetes mellitus without complications: Secondary | ICD-10-CM | POA: Insufficient documentation

## 2017-11-19 DIAGNOSIS — Z79899 Other long term (current) drug therapy: Secondary | ICD-10-CM | POA: Insufficient documentation

## 2017-11-19 DIAGNOSIS — J45909 Unspecified asthma, uncomplicated: Secondary | ICD-10-CM | POA: Insufficient documentation

## 2017-11-19 DIAGNOSIS — B9789 Other viral agents as the cause of diseases classified elsewhere: Secondary | ICD-10-CM | POA: Insufficient documentation

## 2017-11-19 DIAGNOSIS — I10 Essential (primary) hypertension: Secondary | ICD-10-CM | POA: Insufficient documentation

## 2017-11-19 DIAGNOSIS — J069 Acute upper respiratory infection, unspecified: Secondary | ICD-10-CM | POA: Insufficient documentation

## 2017-11-19 HISTORY — DX: Type 2 diabetes mellitus without complications: E11.9

## 2017-11-19 IMAGING — CR DG CHEST 2V
2 series · 2 of 2 positions shown · non-contrast
Comparison: [DATE]

CLINICAL DATA: Cough and shortness of breath for 1 month

EXAM:
CHEST - 2 VIEW

[chest pa]
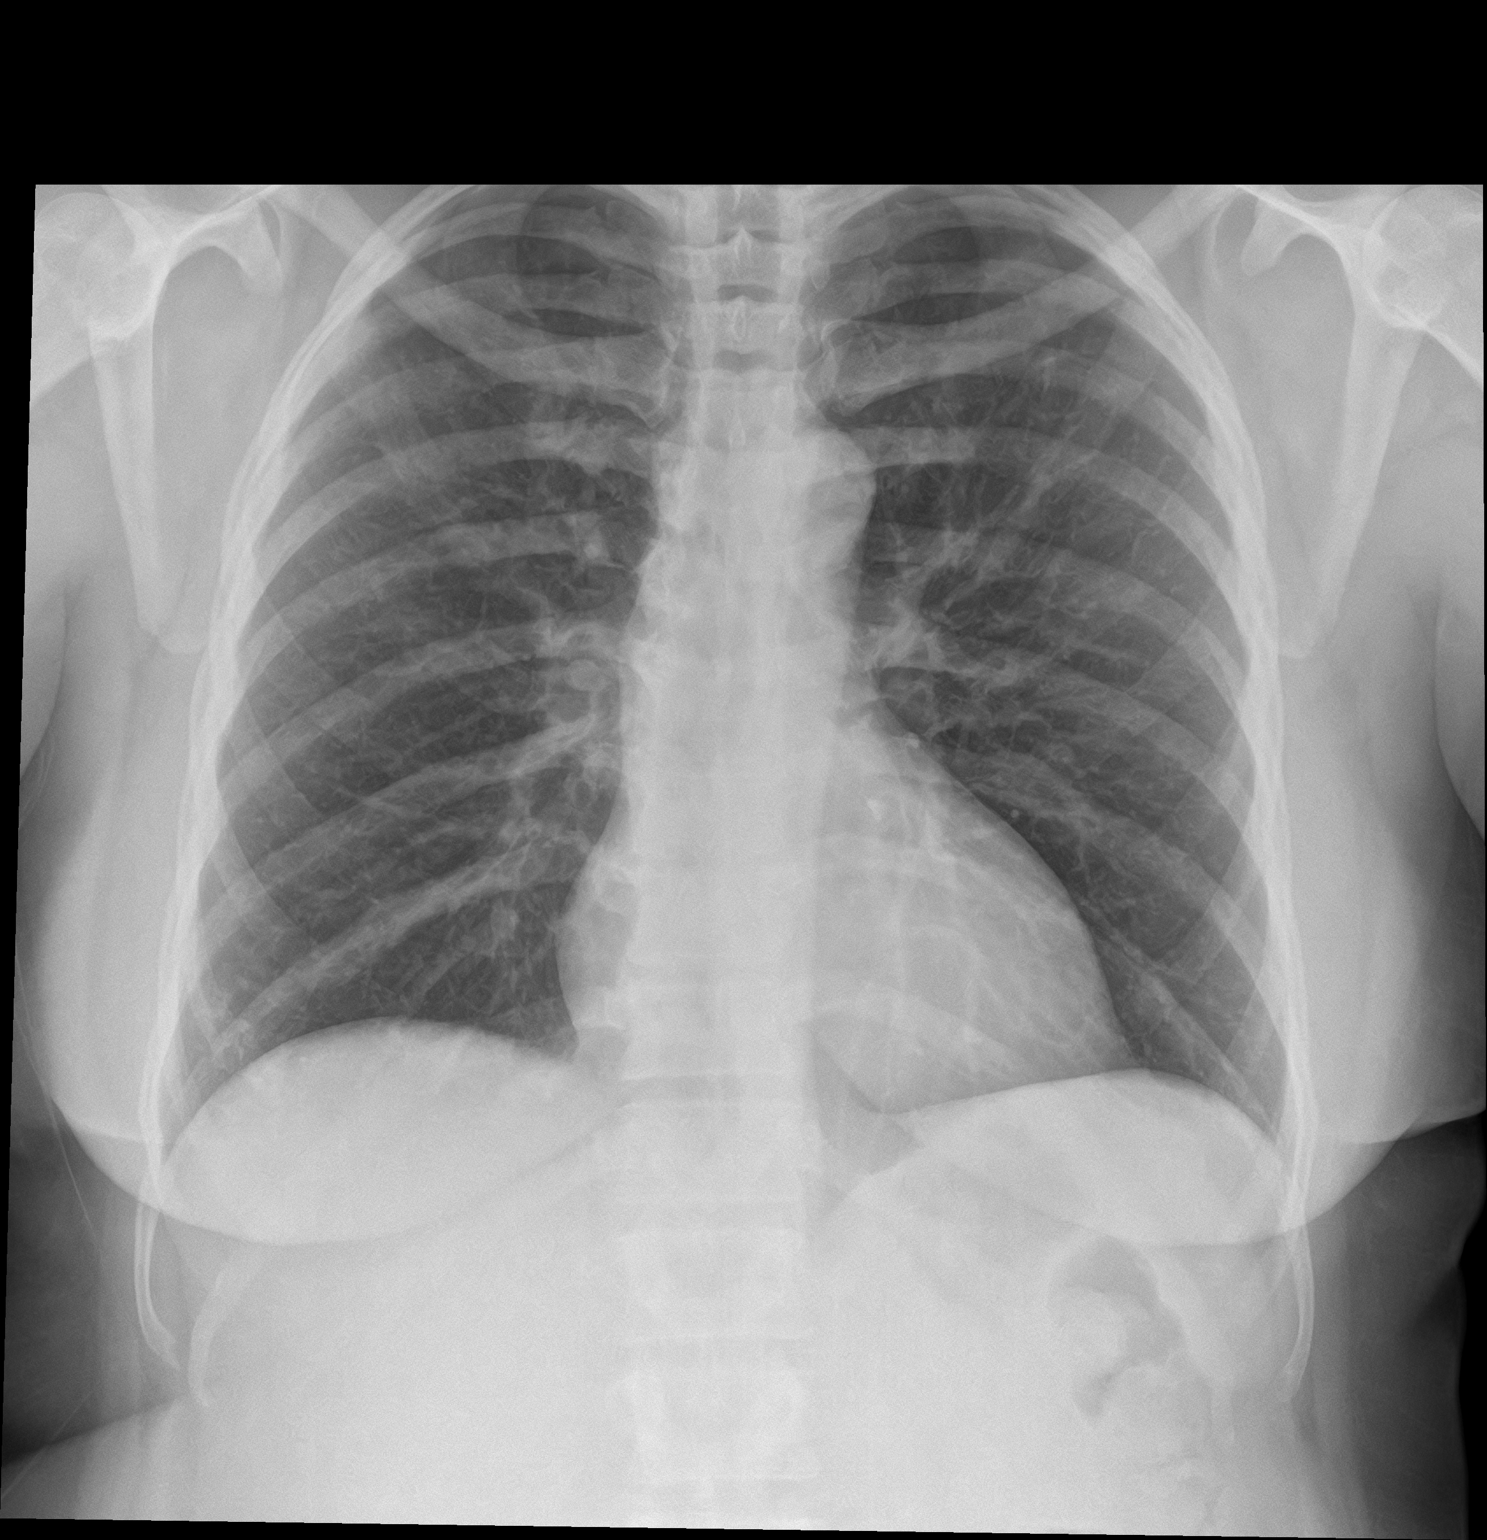

[chest lat]
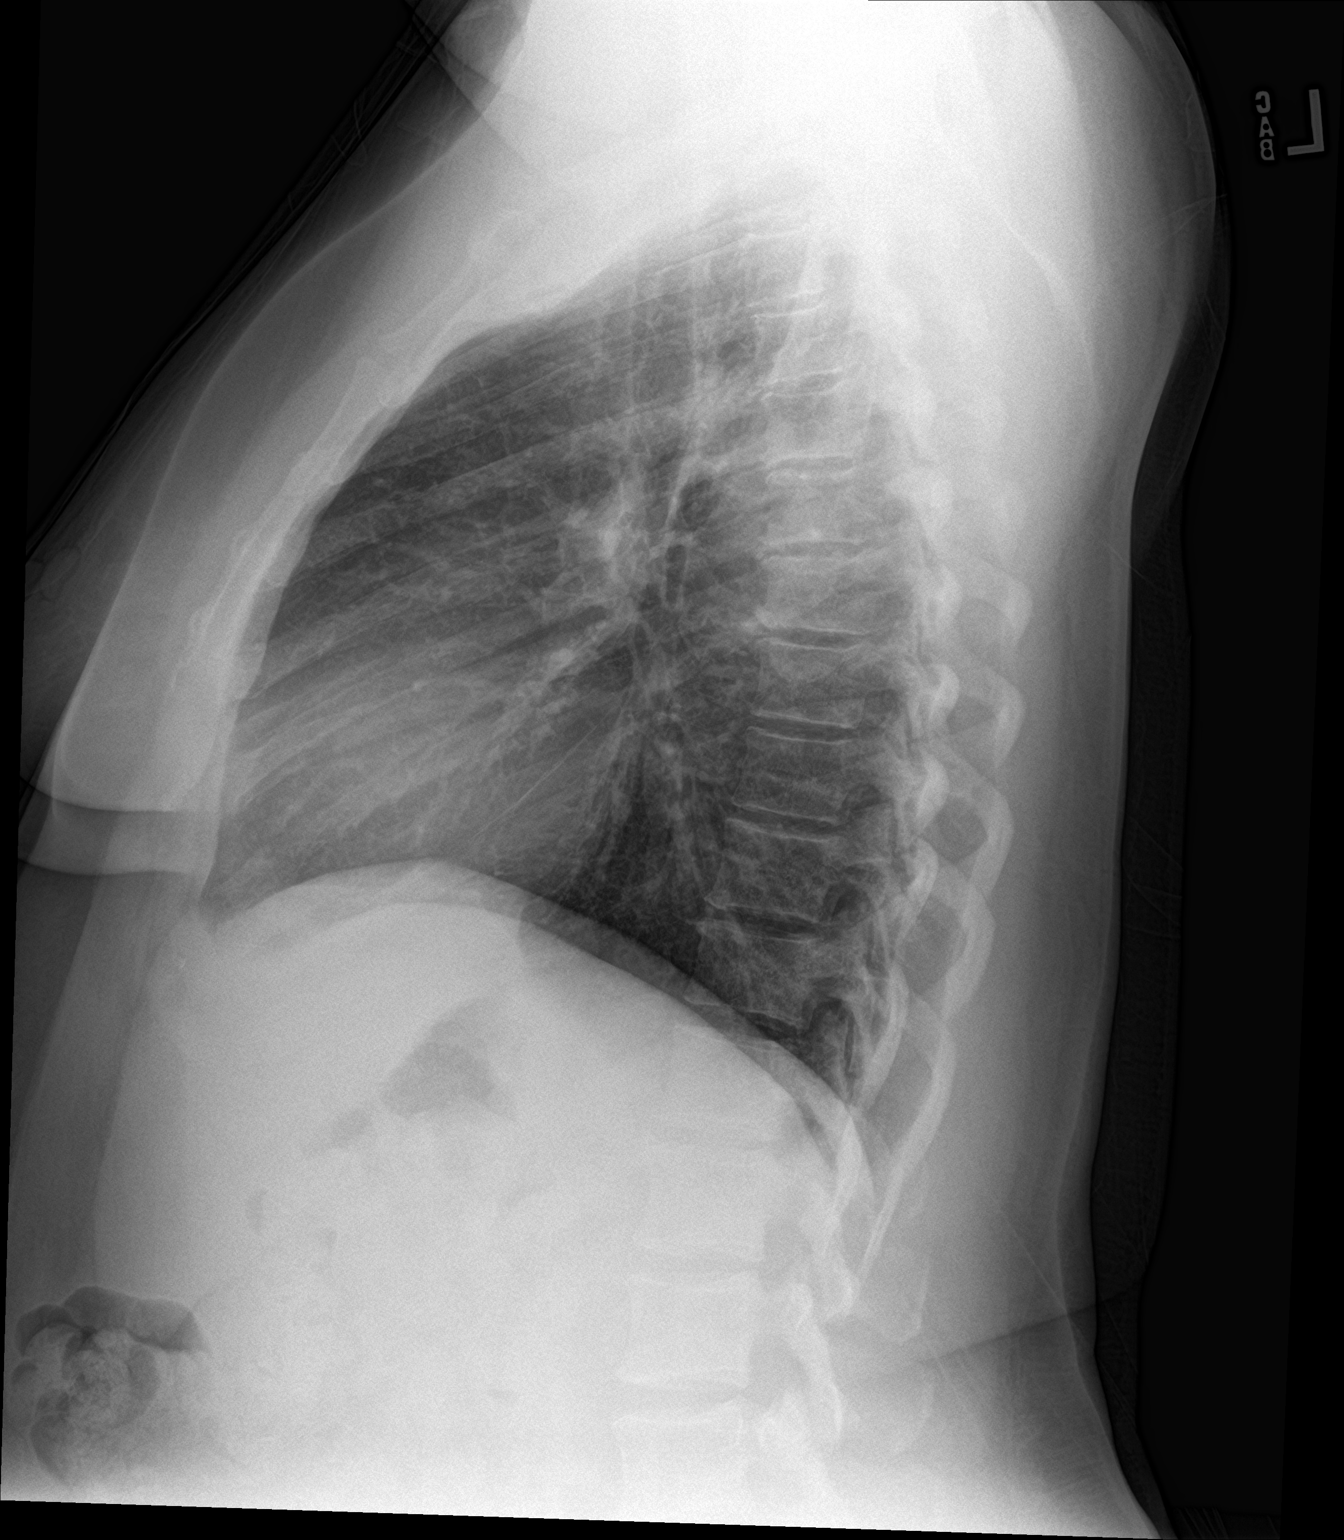

[2 of 2 positions shown; findings below may reference images not displayed]

FINDINGS: Cardiac shadow is within normal limits. The lungs are well aerated
bilaterally. No focal infiltrate or sizable effusion is seen. No
bony abnormality is noted.
IMPRESSION: No active cardiopulmonary disease.

## 2017-11-19 MED ORDER — ACETAMINOPHEN-CODEINE #3 300-30 MG PO TABS: 1.0000 | ORAL_TABLET | Freq: Every evening | ORAL | 0 refills | Status: DC | PRN

## 2017-11-19 MED ORDER — BENZONATATE 100 MG PO CAPS: ORAL_CAPSULE | ORAL | 0 refills | Status: DC

## 2017-11-19 NOTE — Discharge Instructions (Addendum)
Your symptoms are consistent with a viral upper respiratory infection. Your chest x-ray is negative. Take the prescription meds as directed. Continue to use your inhaler as prescribed. Consider taking a daily allergy medicine along with OTC Delsym for cough. See your provider for continued symptoms.

## 2017-11-19 NOTE — ED Triage Notes (Signed)
Presents with some SOB and cough  sxs' started about 1 month ago but has been intermittent  Min relief with inhaler

## 2017-11-19 NOTE — ED Provider Notes (Signed)
[facility] Emergency Department Provider Note ____________________________________________  Time seen: 1115  I have reviewed the triage vital signs and the nursing notes.  HISTORY  Chief Complaint  Cough and URI  HPI Samantha Deleon is a 52 y.o. female presents herself to the ED for evaluation of a 1 month complaint of intermittent cough and shortness of breath.  Patient with a history of mild intermittent asthma, reports a nonproductive cough as well as some chest tightness.  She recently restarted her albuterol inhaler from her primary provider about 2 months earlier.  She reports some cough and some intermittent wheezing.  She denies any fevers, chills, sweats, chest pain, or hemoptysis.  She also reports she had a daily allergy medicine.  Past Medical History:  Diagnosis Date  . Asthma   . Diabetes mellitus without complication ([place])   . Hypertension     There are no active problems to display for this patient.   Past Surgical History:  Procedure Laterality Date  . TUBAL LIGATION      Prior to Admission medications   Medication Sig Start Date End Date Taking? Authorizing Provider  acetaminophen-codeine (TYLENOL #3) 300-30 MG tablet Take 1 tablet by mouth at bedtime as needed for moderate pain. 11/19/17   Sharise Lippy, Dannielle Karvonen, PA-C  albuterol (PROVENTIL HFA;VENTOLIN HFA) 108 (90 BASE) MCG/ACT inhaler Inhale 2 puffs into the lungs every 6 (six) hours as needed for wheezing or shortness of breath. 03/30/15   Beers, Pierce Crane, PA-C  azithromycin (ZITHROMAX Z-PAK) 250 MG tablet Take 2 tablets (500 mg) on  Day 1,  followed by 1 tablet (250 mg) once daily on Days 2 through 5. 03/30/15   Beers, Pierce Crane, PA-C  benzonatate (TESSALON PERLES) 100 MG capsule Take 1-2 tabs TID prn cough 11/19/17   Charels Stambaugh, Dannielle Karvonen, PA-C  cyclobenzaprine (FLEXERIL) 10 MG tablet Take 1 tablet (10 mg total) by mouth 3 (three) times daily as needed for muscle spasms. 08/03/16    Loney Hering, MD  guaiFENesin-codeine 100-10 MG/5ML syrup Take 10 mLs by mouth every 4 (four) hours as needed for cough. 03/30/15   Beers, Pierce Crane, PA-C  hydrochlorothiazide (MICROZIDE) 12.5 MG capsule Take 1 capsule (12.5 mg total) by mouth daily. 06/14/17   Schaevitz, Randall An, MD  HYDROcodone-acetaminophen (NORCO/VICODIN) 5-325 MG tablet Take 1-2 tablets by mouth every 4 (four) hours as needed for moderate pain. 05/23/17   Hinda Kehr, MD  ibuprofen (ADVIL,MOTRIN) 600 MG tablet Take 1 tablet (600 mg total) by mouth every 8 (eight) hours as needed. 06/25/16   Johnn Hai, PA-C  predniSONE (DELTASONE) 20 MG tablet Take 2 tablets (40 mg total) by mouth daily. 04/19/15   Duanne Guess, PA-C    Allergies Patient has no known allergies.  History reviewed. No pertinent family history.  Social History Social History   Tobacco Use  . Smoking status: Never Smoker  . Smokeless tobacco: Never Used  Substance Use Topics  . Alcohol use: No  . Drug use: No    Review of Systems  Constitutional: Negative for fever. Eyes: Negative for visual changes. ENT: Negative for sore throat. Cardiovascular: Negative for chest pain. Respiratory: Positive for shortness of breath. Gastrointestinal: Negative for abdominal pain, vomiting and diarrhea. Genitourinary: Negative for dysuria. Musculoskeletal: Negative for back pain. Skin: Negative for rash. Neurological: Negative for headaches, focal weakness or numbness. ____________________________________________  PHYSICAL EXAM:  VITAL SIGNS: ED Triage Vitals  Enc Vitals Group     BP  11/19/17 1054 140/80     Pulse Rate 11/19/17 1054 84     Resp 11/19/17 1054 20     Temp 11/19/17 1054 97.9 F (36.6 C)     Temp src --      SpO2 11/19/17 1054 99 %     Weight 11/19/17 1052 184 lb (83.5 kg)     Height 11/19/17 1052 5\' 2"  (1.575 m)     Head Circumference --      Peak Flow --      Pain Score 11/19/17 1153 3     Pain Loc --       Pain Edu? --      Excl. in [place]? --     Constitutional: Alert and oriented. Well appearing and in no distress. Head: Normocephalic and atraumatic. Eyes: Conjunctivae are normal. PERRL. Normal extraocular movements Ears: TMs obscured by soft cerumen noted bilaterally. Nose: No congestion/rhinorrhea/epistaxis.  Mouth/Throat: Mucous membranes are moist.  Uvula is midline and tonsils are flat. Neck: Supple. No thyromegaly. Hematological/Lymphatic/Immunological: No cervical lymphadenopathy. Cardiovascular: Normal rate, regular rhythm. Normal distal pulses. Respiratory: Normal respiratory effort. No wheezes/rales/rhonchi. Gastrointestinal: Soft and nontender. No distention. Musculoskeletal: Nontender with normal range of motion in all extremities.  Neurologic:  Normal gait without ataxia. Normal speech and language. No gross focal neurologic deficits are appreciated. Skin:  Skin is warm, dry and intact. No rash noted. Psychiatric: Mood and affect are normal. Patient exhibits appropriate insight and judgment. ____________________________________________   RADIOLOGY  CXR  Negative ____________________________________________  INITIAL IMPRESSION / ASSESSMENT AND PLAN / ED COURSE  Patient with ED evaluation of mild, intermittent wheezing and shortness of breath.  She is also had an onset of cough over the last month.  Patient's exam and x-ray are reassuring at this time.  No acute cardiopulmonary process is noted.  Patient will be discharged with a prescription for Tessalon Perles and Tylenol with codeine to dose at night for cough relief.  She is advised to take an over-the-counter cough medicine as well as a daily allergy medicines as prescribed.  She will follow-up with her primary provider or return to the ED as needed. ____________________________________________  FINAL CLINICAL IMPRESSION(S) / ED DIAGNOSES  Final diagnoses:  Viral URI with cough      Alazne Quant, Dannielle Karvonen,  PA-C 11/19/17 1209    Merlyn Lot, MD 11/19/17 1232

## 2017-11-27 ENCOUNTER — Encounter: Payer: Self-pay | Admitting: Emergency Medicine

## 2017-11-27 ENCOUNTER — Other Ambulatory Visit: Payer: Self-pay

## 2017-11-27 DIAGNOSIS — R51 Headache: Secondary | ICD-10-CM | POA: Insufficient documentation

## 2017-11-27 DIAGNOSIS — R05 Cough: Secondary | ICD-10-CM | POA: Insufficient documentation

## 2017-11-27 DIAGNOSIS — Z5321 Procedure and treatment not carried out due to patient leaving prior to being seen by health care provider: Secondary | ICD-10-CM | POA: Insufficient documentation

## 2017-11-27 NOTE — ED Triage Notes (Addendum)
Pt here with cough x2  weeks; was seen here and diagnosed with URI; says medications she was prescribed have not improved her symptoms; still with cough and now with frontal headache; sore throat has resolved; lungs clear in triage

## 2017-11-27 NOTE — ED Notes (Signed)
Patient to stat desk via wheelchair by EMS from home for fever.  Patient has 20g angiocath to left antecub.  Reports seen earlier for similar symptoms, not feeling better.

## 2017-11-28 ENCOUNTER — Emergency Department
Admission: EM | Admit: 2017-11-28 | Discharge: 2017-11-28 | Disposition: A | Discharge status: Home / Self Care | Payer: Medicaid Other | Attending: Emergency Medicine | Admitting: Emergency Medicine

## 2017-11-29 ENCOUNTER — Emergency Department: Payer: Self-pay

## 2017-11-29 ENCOUNTER — Other Ambulatory Visit: Payer: Self-pay

## 2017-11-29 ENCOUNTER — Emergency Department
Admission: EM | Admit: 2017-11-29 | Discharge: 2017-11-29 | Disposition: A | Discharge status: Home / Self Care | Payer: Self-pay | Attending: Emergency Medicine | Admitting: Emergency Medicine

## 2017-11-29 DIAGNOSIS — E119 Type 2 diabetes mellitus without complications: Secondary | ICD-10-CM | POA: Insufficient documentation

## 2017-11-29 DIAGNOSIS — I10 Essential (primary) hypertension: Secondary | ICD-10-CM | POA: Insufficient documentation

## 2017-11-29 DIAGNOSIS — J209 Acute bronchitis, unspecified: Secondary | ICD-10-CM | POA: Insufficient documentation

## 2017-11-29 IMAGING — CR DG CHEST 2V
1 series · 2 of 2 positions shown · non-contrast
Comparison: Radiographs [DATE].

CLINICAL DATA: Cough.

EXAM:
CHEST - 2 VIEW

[Series 1: dg chest 2 view · 0.14mm/px · 2 of 2 slices shown]
[im 1/2]
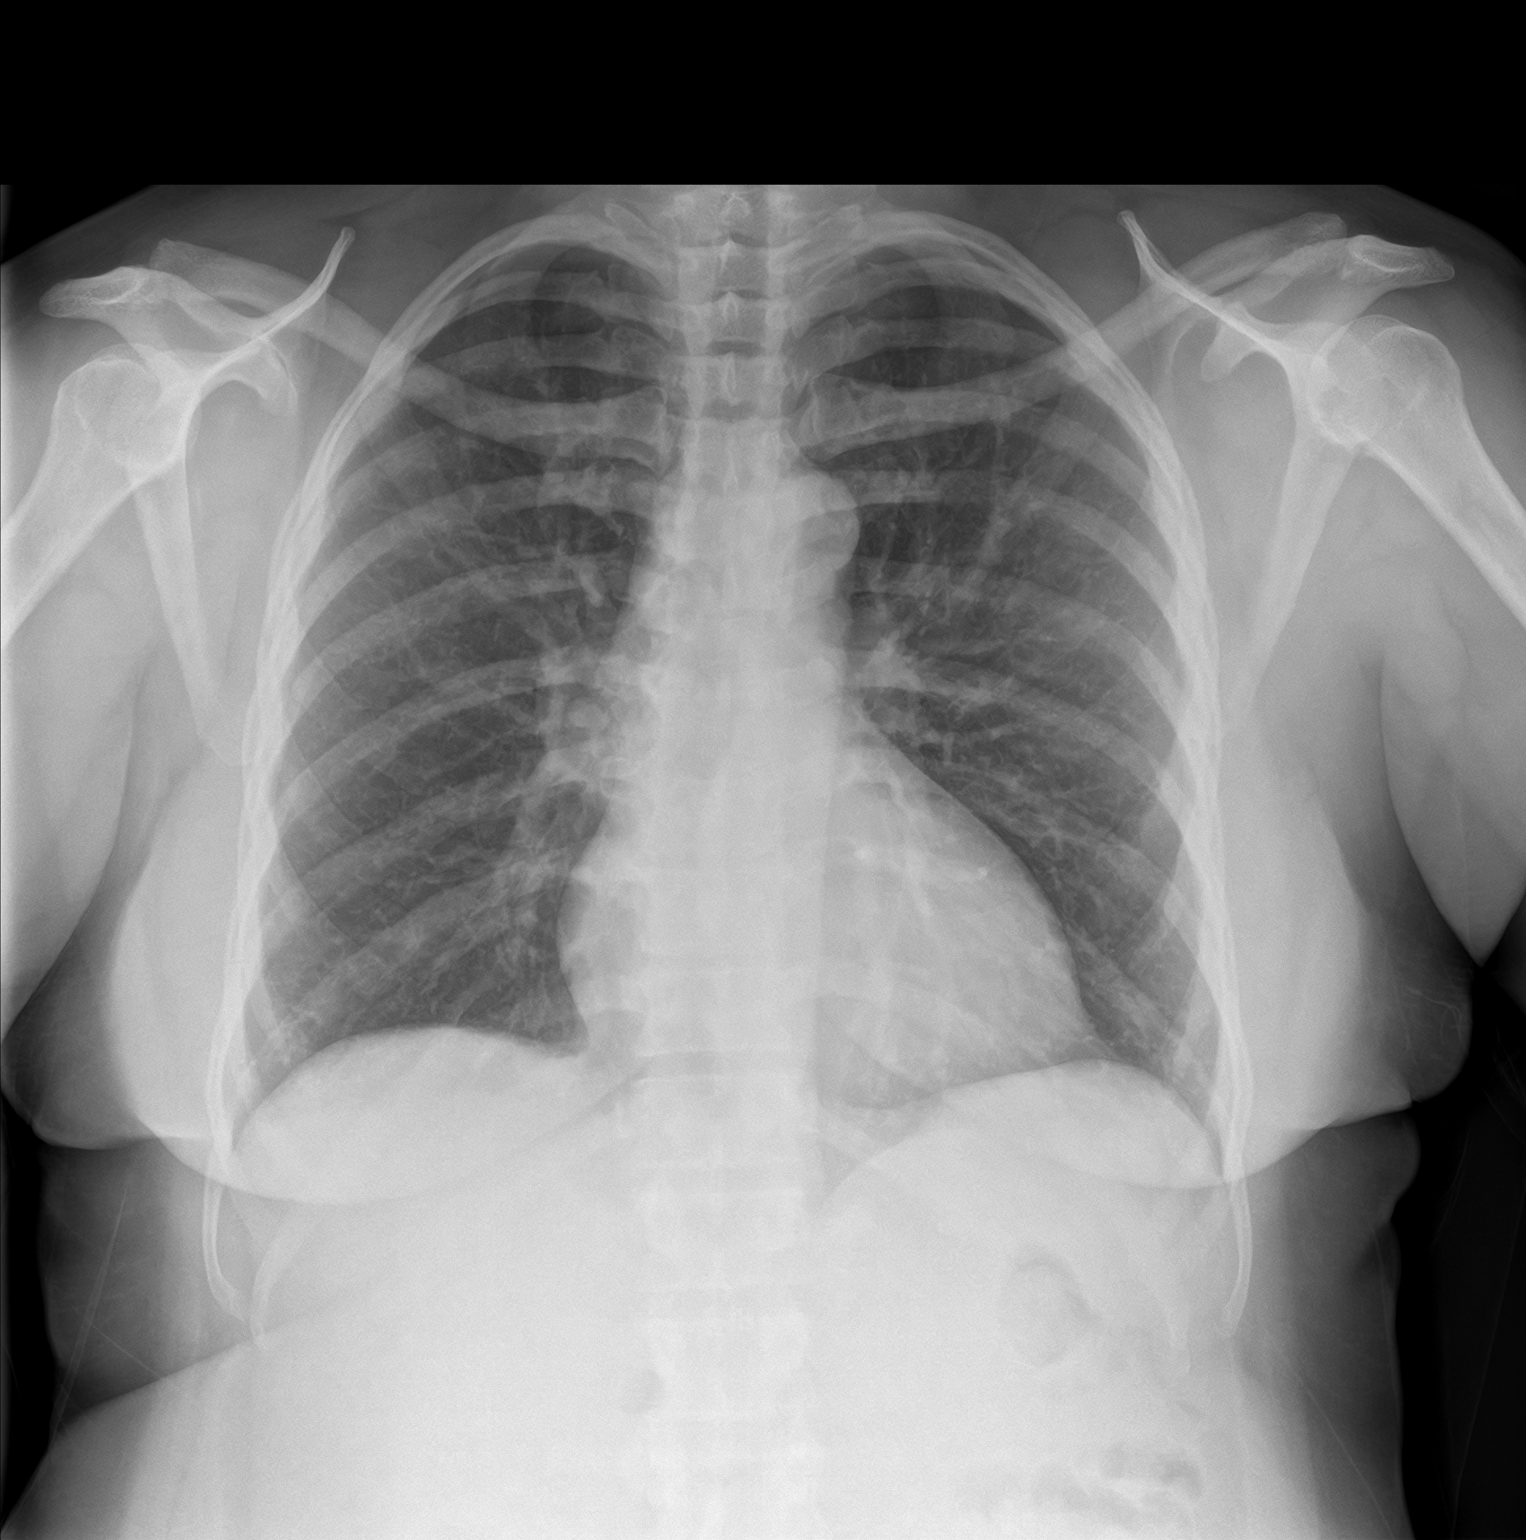
[im 2/2]
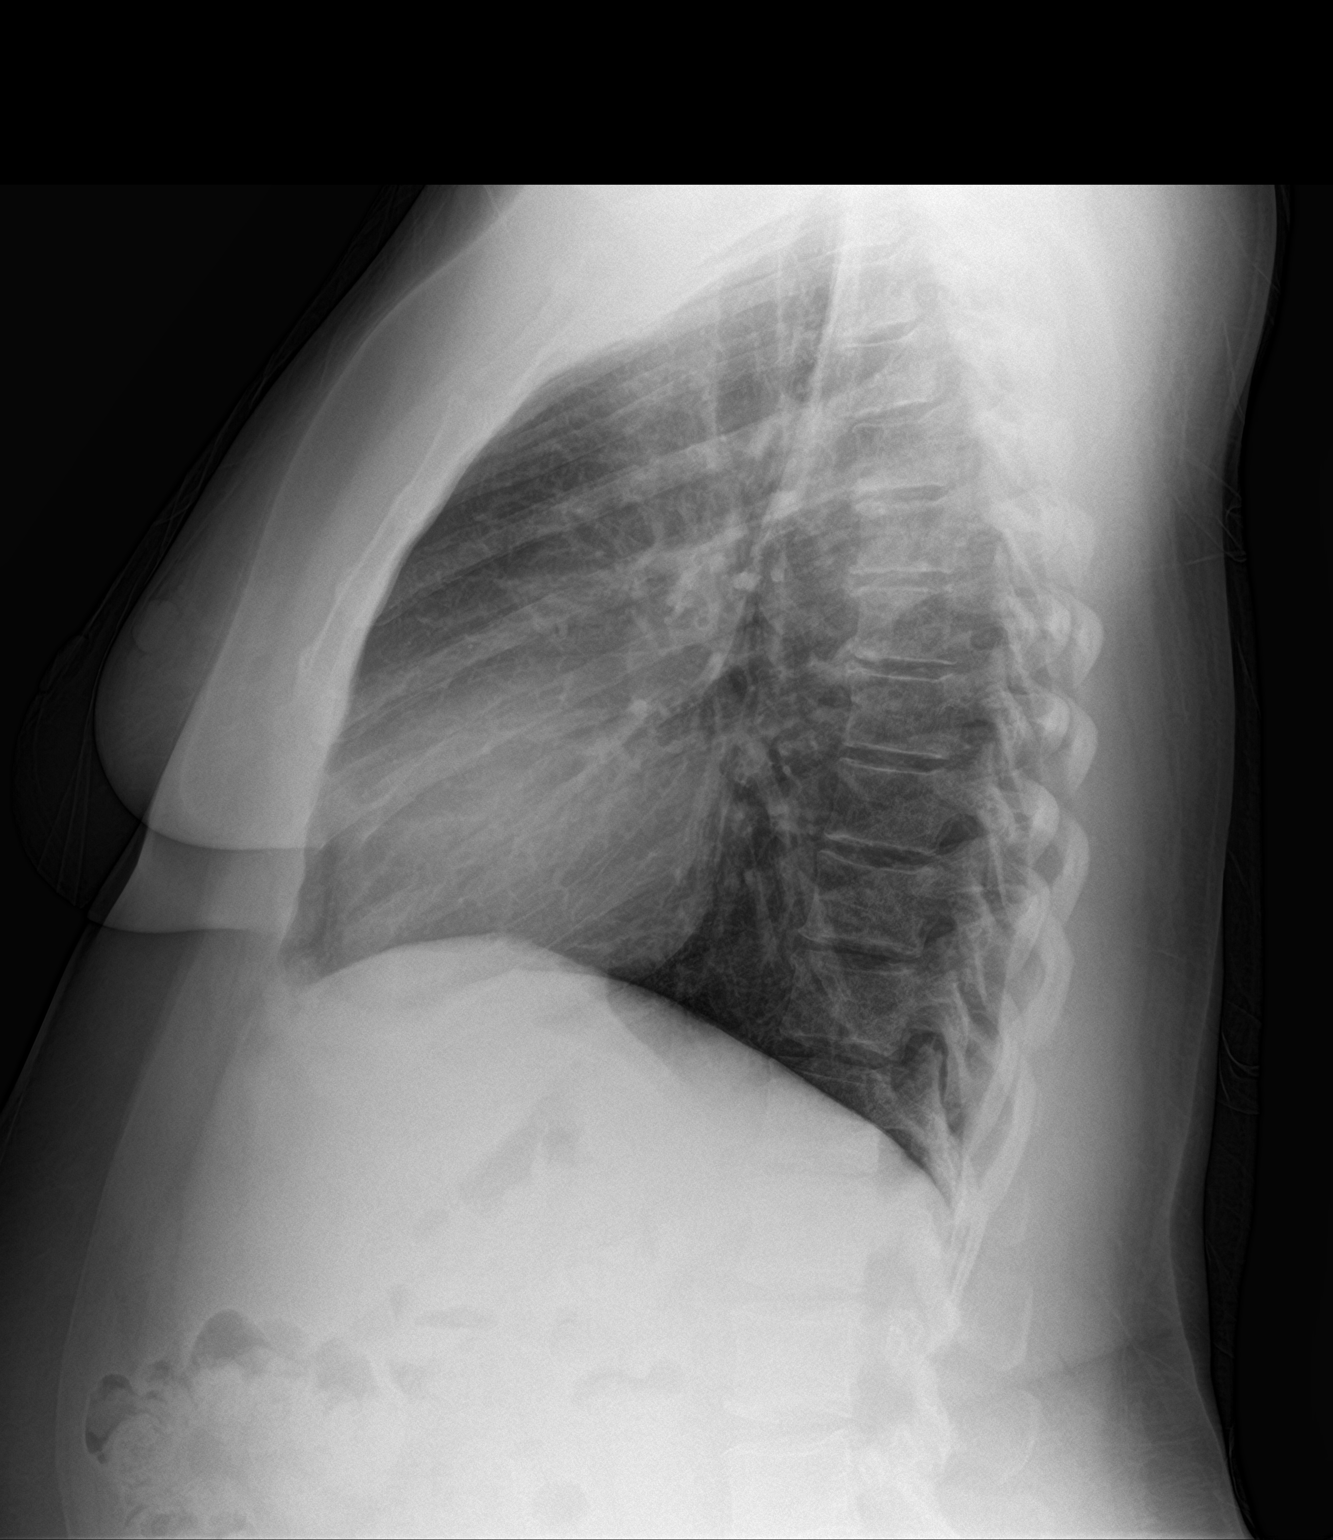

[2 of 2 positions shown; findings below may reference images not displayed]

FINDINGS: The heart size and mediastinal contours are within normal limits.
Both lungs are clear. No pneumothorax or pleural effusion is noted.
The visualized skeletal structures are unremarkable.
IMPRESSION: No active cardiopulmonary disease.

## 2017-11-29 MED ORDER — GUAIFENESIN-CODEINE 100-10 MG/5ML PO SYRP: 5.0000 mL | ORAL_SOLUTION | Freq: Three times a day (TID) | ORAL | 0 refills | Status: AC | PRN

## 2017-11-29 MED ORDER — AZITHROMYCIN 250 MG PO TABS: ORAL_TABLET | ORAL | 0 refills | Status: DC

## 2017-11-29 MED ORDER — PREDNISONE 10 MG PO TABS: ORAL_TABLET | ORAL | 0 refills | Status: DC

## 2017-11-29 NOTE — ED Triage Notes (Signed)
Cough x 1 month, head congestion.

## 2017-11-29 NOTE — ED Provider Notes (Signed)
[facility] Emergency Department Provider Note  ____________________________________________  Time seen: Approximately 3:31 PM  I have reviewed the triage vital signs and the nursing notes.   HISTORY  Chief Complaint Cough    HPI Samantha Deleon is a 52 y.o. female that presents to the emergency department for evaluation of nasal congestion, productive cough with clear sputum for 1 month.  She does not smoke. Patient has asthma and uses inhalers, which have not been helping.  She was seen in the ER 2 weeks ago and was diagnosed with a viral URI.  No fever, chills, SOB with exertion, chest pain, nausea, vomiting, abdominal pain, leg swelling.   Past Medical History:  Diagnosis Date  . Asthma   . Diabetes mellitus without complication ([place])   . Hypertension     There are no active problems to display for this patient.   Past Surgical History:  Procedure Laterality Date  . TUBAL LIGATION      Prior to Admission medications   Medication Sig Start Date End Date Taking? Authorizing Provider  acetaminophen-codeine (TYLENOL #3) 300-30 MG tablet Take 1 tablet by mouth at bedtime as needed for moderate pain. 11/19/17   Menshew, Dannielle Karvonen, PA-C  albuterol (PROVENTIL HFA;VENTOLIN HFA) 108 (90 BASE) MCG/ACT inhaler Inhale 2 puffs into the lungs every 6 (six) hours as needed for wheezing or shortness of breath. 03/30/15   Beers, Pierce Crane, PA-C  azithromycin (ZITHROMAX Z-PAK) 250 MG tablet Take 2 tablets (500 mg) on  Day 1,  followed by 1 tablet (250 mg) once daily on Days 2 through 5. 11/29/17   Laban Emperor, PA-C  benzonatate (TESSALON PERLES) 100 MG capsule Take 1-2 tabs TID prn cough 11/19/17   Menshew, Dannielle Karvonen, PA-C  cyclobenzaprine (FLEXERIL) 10 MG tablet Take 1 tablet (10 mg total) by mouth 3 (three) times daily as needed for muscle spasms. 08/03/16   Loney Hering, MD  guaiFENesin-codeine [facility]) 100-10 MG/5ML syrup Take 5 mLs by mouth 3  (three) times daily as needed for up to 2 days for cough. 11/29/17 12/01/17  Laban Emperor, PA-C  hydrochlorothiazide (MICROZIDE) 12.5 MG capsule Take 1 capsule (12.5 mg total) by mouth daily. 06/14/17   Schaevitz, Randall An, MD  ibuprofen (ADVIL,MOTRIN) 600 MG tablet Take 1 tablet (600 mg total) by mouth every 8 (eight) hours as needed. 06/25/16   Johnn Hai, PA-C  predniSONE (DELTASONE) 10 MG tablet Take 6 tablets on day 1, take 5 tablets on day 2, take 4 tablets on day 3, take 3 tablets on day 4, take 2 tablets on day 5, take 1 tablet on day 6 11/29/17   Laban Emperor, PA-C    Allergies Patient has no known allergies.  No family history on file.  Social History Social History   Tobacco Use  . Smoking status: Never Smoker  . Smokeless tobacco: Never Used  Substance Use Topics  . Alcohol use: No  . Drug use: No     Review of Systems  Constitutional: No fever/chills Eyes: No visual changes. No discharge. ENT: Positive for congestion and rhinorrhea. Cardiovascular: No chest pain. Respiratory: Positive for cough.  Gastrointestinal: No abdominal pain.  No nausea, no vomiting.  No diarrhea.  No constipation. Musculoskeletal: Negative for musculoskeletal pain. Skin: Negative for rash, abrasions, lacerations, ecchymosis. Neurological: Negative for headaches.   ____________________________________________   PHYSICAL EXAM:  VITAL SIGNS: ED Triage Vitals  Enc Vitals Group     BP 11/29/17 1315 Marland Kitchen)  176/83     Pulse Rate 11/29/17 1315 91     Resp 11/29/17 1315 18     Temp 11/29/17 1315 98.6 F (37 C)     Temp Source 11/29/17 1315 Oral     SpO2 11/29/17 1315 97 %     Weight 11/29/17 1318 174 lb (78.9 kg)     Height 11/29/17 1318 5\' 2"  (1.575 m)     Head Circumference --      Peak Flow --      Pain Score 11/29/17 1318 8     Pain Loc --      Pain Edu? --      Excl. in [place]? --      Constitutional: Alert and oriented. Well appearing and in no acute distress. Eyes:  Conjunctivae are normal. PERRL. EOMI. No discharge. Head: Atraumatic. ENT: No frontal and maxillary sinus tenderness.      Ears: Tympanic membranes pearly gray with good landmarks. No discharge.      Nose: Mild congestion/rhinnorhea.      Mouth/Throat: Mucous membranes are moist. Oropharynx non-erythematous. Tonsils not enlarged. No exudates. Uvula midline. Neck: No stridor.   Hematological/Lymphatic/Immunilogical: No cervical lymphadenopathy. Cardiovascular: Normal rate, regular rhythm.  Good peripheral circulation. Respiratory: Normal respiratory effort without tachypnea or retractions. Lungs CTAB. Good air entry to the bases with no decreased or absent breath sounds. Gastrointestinal: Bowel sounds 4 quadrants. Soft and nontender to palpation. No guarding or rigidity. No palpable masses. No distention. Musculoskeletal: Full range of motion to all extremities. No gross deformities appreciated. Neurologic:  Normal speech and language. No gross focal neurologic deficits are appreciated.  Skin:  Skin is warm, dry and intact. No rash noted.   ____________________________________________   LABS (all labs ordered are listed, but only abnormal results are displayed)  Labs Reviewed - No data to display ____________________________________________  EKG   ____________________________________________  RADIOLOGY Robinette Haines, personally viewed and evaluated these images (plain radiographs) as part of my medical decision making, as well as reviewing the written report by the radiologist.  Dg Chest 2 View  Result Date: 11/29/2017 CLINICAL DATA:  Cough. EXAM: CHEST - 2 VIEW COMPARISON:  Radiographs of November 19, 2017. FINDINGS: The heart size and mediastinal contours are within normal limits. Both lungs are clear. No pneumothorax or pleural effusion is noted. The visualized skeletal structures are unremarkable. IMPRESSION: No active cardiopulmonary disease. Electronically Signed   By: Marijo Conception, M.D.   On: 11/29/2017 15:14    ____________________________________________    PROCEDURES  Procedure(s) performed:    Procedures    Medications - No data to display   ____________________________________________   INITIAL IMPRESSION / ASSESSMENT AND PLAN / ED COURSE  Pertinent labs & imaging results that were available during my care of the patient were reviewed by me and considered in my medical decision making (see chart for details).  Review of the [place] CSRS was performed in accordance of the [place] prior to dispensing any controlled drugs.   Patient's diagnosis is consistent with bronchitis. Vital signs and exam are reassuring. Chest xray negative for acute cardiopulmonary processes. Patient appears well and is staying well hydrated. Patient feels comfortable going home. Patient will be discharged home with prescriptions for azithromycin, prednisone, robitussin. Patient is to follow up with PCP as needed or otherwise directed. Patient is given ED precautions to return to the ED for any worsening or new symptoms.     ____________________________________________  FINAL CLINICAL IMPRESSION(S) / ED DIAGNOSES  Final  diagnoses:  Acute bronchitis, unspecified organism      NEW MEDICATIONS STARTED DURING THIS VISIT:  ED Discharge Orders        Ordered    guaiFENesin-codeine (ROBITUSSIN AC) 100-10 MG/5ML syrup  3 times daily PRN     11/29/17 1533    predniSONE (DELTASONE) 10 MG tablet     11/29/17 1533    azithromycin (ZITHROMAX Z-PAK) 250 MG tablet     11/29/17 1533          This chart was dictated using voice recognition software/Dragon. Despite best efforts to proofread, errors can occur which can change the meaning. Any change was purely unintentional.    Laban Emperor, PA-C 11/29/17 1540    Earleen Newport, MD 11/29/17 [phone]

## 2018-05-27 ENCOUNTER — Ambulatory Visit: Payer: Medicaid Other | Admitting: Pharmacy Technician

## 2018-05-27 ENCOUNTER — Encounter (INDEPENDENT_AMBULATORY_CARE_PROVIDER_SITE_OTHER): Payer: Self-pay

## 2018-05-27 DIAGNOSIS — Z79899 Other long term (current) drug therapy: Secondary | ICD-10-CM

## 2018-05-27 NOTE — Progress Notes (Signed)
  Completed [facility] application and contract.  Patient agreed to all terms of the [facility] contract.    Patient approved to receive medication assistance at [facility] as long as eligibility criteria continues to be met.    Provided patient with community resource material based on her particular needs.    Referral to [facility].  Referral for MTM.  [facility]

## 2018-06-06 ENCOUNTER — Encounter: Payer: Self-pay | Admitting: Pharmacist

## 2018-06-06 ENCOUNTER — Ambulatory Visit: Payer: Medicaid Other | Admitting: Pharmacist

## 2018-06-06 ENCOUNTER — Other Ambulatory Visit: Payer: Self-pay

## 2018-06-06 VITALS — BP 120/74 | Ht 62.0 in | Wt 184.0 lb

## 2018-06-06 DIAGNOSIS — Z79899 Other long term (current) drug therapy: Secondary | ICD-10-CM

## 2018-06-06 NOTE — Progress Notes (Signed)
[facility] Visit Note  Patient: Samantha Deleon MRN: [id number] Date of Birth: [birth year] PCP: Jenell Milliner, Dan [place], [place]  Shonika A Brannum 52 y.o. female presents for an initial MTM visit today. Primary objective of today's visit is to reconcile and fill medications. Pt has just recently been seen for primary care in [place].    BP 120/74 (BP Location: Right Arm, Patient Position: Sitting, Cuff Size: Large)   Ht 5\' 2"  (1.575 m)   Wt 184 lb (83.5 kg)   BMI 33.65 kg/m   Patient Information   Past Medical History:  Diagnosis Date  . Asthma   . Diabetes mellitus without complication (HCC)   . Hypertension       Past Surgical History:  Procedure Laterality Date  . TUBAL LIGATION      History reviewed. No pertinent family history.  New Diagnoses (since last visit): n/a  Family Support:good lives with mother   Social History   Substance and Sexual Activity  Alcohol Use No      Social History   Tobacco Use  Smoking Status Never Smoker  Smokeless Tobacco Never Used      Health Maintenance  Topic Date Due  . HIV Screening  03/22/1983  . TETANUS/TDAP  03/22/1987  . PAP SMEAR  03/21/1989  . MAMMOGRAM  03/21/2018  . COLONOSCOPY  03/21/2018  . INFLUENZA VACCINE  03/31/2018   Pt stated she is Pre-diabetes - most recent A1c from 5/19 was 5.3  Outpatient Encounter Medications as of 06/06/2018  Medication Sig  . albuterol (PROVENTIL HFA;VENTOLIN HFA) 108 (90 BASE) MCG/ACT inhaler Inhale 2 puffs into the lungs every 6 (six) hours as needed for wheezing or shortness of breath.  Marland Kitchen amLODipine (NORVASC) 10 MG tablet Take 10 mg by mouth daily.  . cyclobenzaprine (FLEXERIL) 10 MG tablet Take 10 mg by mouth 3 (three) times daily as needed for muscle spasms.  . DULoxetine (CYMBALTA) 20 MG capsule Take 20 mg by mouth daily.  Marland Kitchen gabapentin (NEURONTIN) 100 MG capsule Take 100 mg by mouth at bedtime.  Marland Kitchen lisinopril (PRINIVIL,ZESTRIL) 10 MG tablet Take 10 mg by mouth  daily.  . methylPREDNISolone (MEDROL) 4 MG tablet Take 4 mg by mouth taper from 4 doses each day to 1 dose and stop.  . [DISCONTINUED] acetaminophen-codeine (TYLENOL #3) 300-30 MG tablet Take 1 tablet by mouth at bedtime as needed for moderate pain.  . [DISCONTINUED] azithromycin (ZITHROMAX Z-PAK) 250 MG tablet Take 2 tablets (500 mg) on  Day 1,  followed by 1 tablet (250 mg) once daily on Days 2 through 5.  . [DISCONTINUED] benzonatate (TESSALON PERLES) 100 MG capsule Take 1-2 tabs TID prn cough  . [DISCONTINUED] cyclobenzaprine (FLEXERIL) 10 MG tablet Take 1 tablet (10 mg total) by mouth 3 (three) times daily as needed for muscle spasms.  . [DISCONTINUED] hydrochlorothiazide (MICROZIDE) 12.5 MG capsule Take 1 capsule (12.5 mg total) by mouth daily.  . [DISCONTINUED] ibuprofen (ADVIL,MOTRIN) 600 MG tablet Take 1 tablet (600 mg total) by mouth every 8 (eight) hours as needed.  . [DISCONTINUED] predniSONE (DELTASONE) 10 MG tablet Take 6 tablets on day 1, take 5 tablets on day 2, take 4 tablets on day 3, take 3 tablets on day 4, take 2 tablets on day 5, take 1 tablet on day 6   No facility-administered encounter medications on file as of 06/06/2018.     Assessment and Plan:  Amlodipine, lisinipril, and duloxetine transferred this morning from [facility] in [place] and she picked them  up at the end of the visit. She has been without her meds for over a month now since she has not been able to afford them. She also needs gabapentin, Medrol, and cyclobenzaprine which will be faxed over later today. She needs an albuterol inhaler as well but does not have an active rx. Will contact MD office to request.  Compliance/Adherance:Pt knows the names/indications/frequency of her medications with prompting. She states that she does miss doses sometimes, maybe as often as twice a week. She said that she may forget her BP meds in the morning, but will take them in the afternoon with lunch.  HTN: BP today of 120/74  within goal of < 130/80. Discussed goal BP with pt.   Asthma: fairly well controlled but needs rescue inhaler on-hand. Discussed the importance of keeping inhaler with her at all times. She told me she uses it more often in the spring and fall when the weather changes. She says that she uses it several times/week during these times of year.   Back Pain/neuropathy: pt moves slowly and has difficulty standing from sitting position. She has her cane with her today. She was unable to previously get the medrol and gabapentin filled due to cost. Discussed these medications would be ready by end of day today. She says that the gabapentin helps along with the duloxetine .   Depression: did not assess this visit, pt has been off duloxetine since June.  RTC in 6mos, Reassess BP, compliance/adherance, and depression.  Denice Paradise, PharmD, RPh [facility]) [phone]

## 2018-06-07 ENCOUNTER — Ambulatory Visit: Payer: Self-pay

## 2018-06-07 ENCOUNTER — Telehealth: Payer: Self-pay | Admitting: Adult Health Nurse Practitioner

## 2018-06-07 NOTE — Telephone Encounter (Signed)
Called to make appointment. Appointment scheduled for 10/17.

## 2018-06-16 ENCOUNTER — Ambulatory Visit: Payer: Medicaid Other | Admitting: Adult Health Nurse Practitioner

## 2018-06-16 VITALS — BP 119/67 | HR 99 | Temp 99.5°F | Ht 63.5 in | Wt 176.6 lb

## 2018-06-16 DIAGNOSIS — E119 Type 2 diabetes mellitus without complications: Secondary | ICD-10-CM | POA: Insufficient documentation

## 2018-06-16 DIAGNOSIS — J00 Acute nasopharyngitis [common cold]: Secondary | ICD-10-CM | POA: Insufficient documentation

## 2018-06-16 DIAGNOSIS — I10 Essential (primary) hypertension: Secondary | ICD-10-CM

## 2018-06-16 NOTE — Progress Notes (Signed)
Subjective:    Patient ID: Samantha Deleon, female    DOB: March 11, 1966, 52 y.o.   MRN: [id number]  HPI  Yiyi Eddington is a 52 yo F here to establish care with [place]. History of Asthma, Prediabetes, and HTN. She presents w/ cc of chills and a sore throat with a productive cough.  Asthma: she is taking Albuterol. She reports using 2 puffs/week.  Prediabetes: Pt reports she sees a RD. Neuropathy present. She is taking Gabapentin 100mg  HTN: BP at target at 119/67. She is taking Amlodipine 10mg  and Lisinopril 10mg  She is taking Medrol 4mg  for pain and her current cold.   Review of Systems  Constitutional: Positive for chills and fever.  Respiratory: Positive for cough.        No colored sputum production  All other systems reviewed and are negative.      Objective:   Physical Exam  Constitutional: She is oriented to person, place, and time. She appears well-developed and well-nourished.  Cardiovascular: Normal rate, regular rhythm and normal heart sounds.  Pulmonary/Chest: Effort normal and breath sounds normal.  Abdominal: Soft. Bowel sounds are normal.  Neurological: She is alert and oriented to person, place, and time.  Skin: Skin is warm and dry.  Psychiatric: She has a normal mood and affect. Her behavior is normal.  Vitals reviewed.   BP 119/67   Pulse 99   Temp 99.5 F (37.5 C)   Ht 5' 3.5" (1.613 m)   Wt 176 lb 9.6 oz (80.1 kg)   LMP 06/16/2014 (Approximate)   BMI 30.79 kg/m        Assessment & Plan:   Routine labs tonight.  Asthma: Stable. No current changes  Prediabetes: Check A1C tonight to determine if need for meds.   HTN: Stable. No current changes  Continue Medrol pack and supportive care for cold symptoms. If leukocytosis on the CBC will rx for abx.    F/u in 2 weeks for lab review and check that cold has resolved.

## 2018-06-17 ENCOUNTER — Other Ambulatory Visit: Payer: Self-pay

## 2018-06-17 LAB — CBC
HEMOGLOBIN: 13.1 g/dL (ref 11.1–15.9)
Hematocrit: 37.8 % (ref 34.0–46.6)
MCH: 28.2 pg (ref 26.6–33.0)
MCHC: 34.7 g/dL (ref 31.5–35.7)
MCV: 81 fL (ref 79–97)
Platelets: 338 10*3/uL (ref 150–450)
RBC: 4.65 x10E6/uL (ref 3.77–5.28)
RDW: 12.6 % (ref 12.3–15.4)
WBC: 21.9 10*3/uL (ref 3.4–10.8)

## 2018-06-17 LAB — LIPID PANEL
CHOLESTEROL TOTAL: 148 mg/dL (ref 100–199)
Chol/HDL Ratio: 2.6 ratio (ref 0.0–4.4)
HDL: 58 mg/dL (ref >39)
LDL CALC: 74 mg/dL (ref 0–99)
TRIGLYCERIDES: 80 mg/dL (ref 0–149)
VLDL Cholesterol Cal: 16 mg/dL (ref 5–40)

## 2018-06-17 LAB — COMPREHENSIVE METABOLIC PANEL
ALBUMIN: 4.5 g/dL (ref 3.5–5.5)
ALT: 10 IU/L (ref 0–32)
AST: 14 IU/L (ref 0–40)
Albumin/Globulin Ratio: 1.3 (ref 1.2–2.2)
Alkaline Phosphatase: 105 IU/L (ref 39–117)
BILIRUBIN TOTAL: 0.3 mg/dL (ref 0.0–1.2)
BUN / CREAT RATIO: 12 (ref 9–23)
BUN: 11 mg/dL (ref 6–24)
CO2: 22 mmol/L (ref 20–29)
CREATININE: 0.91 mg/dL (ref 0.57–1.00)
Calcium: 9.8 mg/dL (ref 8.7–10.2)
Chloride: 99 mmol/L (ref 96–106)
GFR calc non Af Amer: 74 mL/min/{1.73_m2} (ref >59)
GFR, EST AFRICAN AMERICAN: 85 mL/min/{1.73_m2} (ref >59)
GLUCOSE: 147 mg/dL — AB (ref 65–99)
Globulin, Total: 3.6 g/dL (ref 1.5–4.5)
POTASSIUM: 4.2 mmol/L (ref 3.5–5.2)
Sodium: 137 mmol/L (ref 134–144)
TOTAL PROTEIN: 8.1 g/dL (ref 6.0–8.5)

## 2018-06-17 LAB — TSH: TSH: 0.137 u[IU]/mL — ABNORMAL LOW (ref 0.450–4.500)

## 2018-06-17 LAB — HEMOGLOBIN A1C
ESTIMATED AVERAGE GLUCOSE: 114 mg/dL
Hgb A1c MFr Bld: 5.6 % (ref 4.8–5.6)

## 2018-06-17 MED ORDER — AZITHROMYCIN 250 MG PO TABS: ORAL_TABLET | ORAL | 0 refills | Status: DC

## 2018-06-21 ENCOUNTER — Other Ambulatory Visit: Payer: Self-pay

## 2018-06-28 ENCOUNTER — Ambulatory Visit: Payer: Medicaid Other | Admitting: Urology

## 2018-06-28 VITALS — BP 130/88 | HR 74 | Temp 97.7°F | Wt 180.6 lb

## 2018-06-28 DIAGNOSIS — R0602 Shortness of breath: Secondary | ICD-10-CM

## 2018-06-28 DIAGNOSIS — R7989 Other specified abnormal findings of blood chemistry: Secondary | ICD-10-CM

## 2018-06-28 DIAGNOSIS — J00 Acute nasopharyngitis [common cold]: Secondary | ICD-10-CM

## 2018-06-28 MED ORDER — FLUTICASONE-SALMETEROL 100-50 MCG/DOSE IN AEPB: 1.0000 | INHALATION_SPRAY | Freq: Two times a day (BID) | RESPIRATORY_TRACT | 3 refills | Status: DC

## 2018-06-28 MED ORDER — ALBUTEROL SULFATE HFA 108 (90 BASE) MCG/ACT IN AERS: 2.0000 | INHALATION_SPRAY | Freq: Four times a day (QID) | RESPIRATORY_TRACT | 2 refills | Status: DC | PRN

## 2018-06-28 NOTE — Progress Notes (Signed)
Patient: Samantha Deleon Female    DOB: 1965/11/30   52 y.o.   MRN: [id number] Visit Date: 06/28/2018  Today's Provider: Michiel Cowboy, PA-C   Chief Complaint  Patient presents with  . Labs Only  . Eye Exam    referral for eye exam  . Shortness of Breath    started yesterday on exertion    Subjective:    HPI Treated for URI last week with prednisone and Z-pak - the chills, sore throat and cough have abated  SOB started yesterday - occurred with walking around the block yesterday evening, she had to stop to catch her breath and it felt like her heart was going to jump out of her chest, she did not have her inhaler with her as she is out of her medication, she denied any CP, nausea or vomiting  She has not walked since yesterday.   We had the patient circle around the clinic to try and recreate the symptoms.  She did not sat below 92% and it would return to 98%.   Pulse up to 101 and recovers quickly to 78.   Cardiac workup in June 2019 was negative.    Allergies  Allergen Reactions  . Seasonal Ic [Cholestatin]    Previous Medications   AMLODIPINE (NORVASC) 10 MG TABLET    Take 10 mg by mouth daily.   AZITHROMYCIN (ZITHROMAX) 250 MG TABLET    Take 2 tabs first day, then 1 tab each day for 4 days   CYCLOBENZAPRINE (FLEXERIL) 10 MG TABLET    Take 10 mg by mouth 3 (three) times daily as needed for muscle spasms.   DULOXETINE (CYMBALTA) 20 MG CAPSULE    Take 20 mg by mouth daily.   GABAPENTIN (NEURONTIN) 100 MG CAPSULE    Take 100 mg by mouth at bedtime.   LISINOPRIL (PRINIVIL,ZESTRIL) 10 MG TABLET    Take 10 mg by mouth daily.   METHYLPREDNISOLONE (MEDROL) 4 MG TABLET    Take 4 mg by mouth taper from 4 doses each day to 1 dose and stop.    Review of Systems  Constitutional: Negative.   HENT: Negative.   Eyes: Negative.   Respiratory: Negative.   Cardiovascular: Negative.   Gastrointestinal: Negative.   Endocrine: Negative.   Genitourinary: Negative.   Musculoskeletal:  Negative.   Skin: Negative.   Allergic/Immunologic: Negative.   Neurological: Negative.   Hematological: Negative.   Psychiatric/Behavioral: Negative.     Social History   Tobacco Use  . Smoking status: Never Smoker  . Smokeless tobacco: Never Used  Substance Use Topics  . Alcohol use: No   Objective:   BP 130/88   Pulse 74   Temp 97.7 F (36.5 C)   Wt 180 lb 9.6 oz (81.9 kg)   LMP  (Approximate) Comment: roughly 4 years ago  BMI 31.49 kg/m   Physical Exam  Constitutional: She is oriented to person, place, and time. She appears well-developed and well-nourished.  HENT:  Head: Normocephalic and atraumatic.  Mouth/Throat: Oropharynx is clear and moist.  Eyes: Pupils are equal, round, and reactive to light. EOM are normal.  Neck: Normal range of motion.  Cardiovascular: Normal rate, regular rhythm and normal heart sounds.  Pulmonary/Chest: Effort normal and breath sounds normal.  Abdominal: Soft.  Musculoskeletal: Normal range of motion.       Right lower leg: Normal.       Left lower leg: Normal.  Neurological: She is alert and oriented to person, place, and  time.  Skin: Skin is warm and dry.  Psychiatric: She has a normal mood and affect. Her behavior is normal.        Assessment & Plan:     1. Asthma Without inhaler - refill request sent to Medication Management  Patient states she had been on Advair in the past - sent script for Advair diskus to [facility]ication Management  Reviewed red flag signs and instructed to go to the ED if occur  2. Recent URI CBC and TSH rechecked tonight  Obtain chest X-ray   Michiel Cowboy, PA-C   [facility]

## 2018-06-29 LAB — CBC WITH DIFFERENTIAL/PLATELET
BASOS: 1 %
Basophils Absolute: 0.1 10*3/uL (ref 0.0–0.2)
EOS (ABSOLUTE): 0.3 10*3/uL (ref 0.0–0.4)
EOS: 2 %
HEMATOCRIT: 38.7 % (ref 34.0–46.6)
HEMOGLOBIN: 12.8 g/dL (ref 11.1–15.9)
Immature Grans (Abs): 0.1 10*3/uL (ref 0.0–0.1)
Immature Granulocytes: 1 %
LYMPHS ABS: 3.5 10*3/uL — AB (ref 0.7–3.1)
Lymphs: 27 %
MCH: 28.3 pg (ref 26.6–33.0)
MCHC: 33.1 g/dL (ref 31.5–35.7)
MCV: 85 fL (ref 79–97)
MONOCYTES: 6 %
Monocytes Absolute: 0.8 10*3/uL (ref 0.1–0.9)
NEUTROS ABS: 8.1 10*3/uL — AB (ref 1.4–7.0)
Neutrophils: 63 %
Platelets: 264 10*3/uL (ref 150–450)
RBC: 4.53 x10E6/uL (ref 3.77–5.28)
RDW: 13 % (ref 12.3–15.4)
WBC: 12.8 10*3/uL — AB (ref 3.4–10.8)

## 2018-06-29 LAB — TSH: TSH: 1.73 u[IU]/mL (ref 0.450–4.500)

## 2018-06-30 ENCOUNTER — Telehealth: Payer: Self-pay | Admitting: Pharmacist

## 2018-06-30 NOTE — Telephone Encounter (Signed)
06/30/2018 10:30:33 AM - Ventolin HFA  06/30/18 Received pharmacy printout for Ventolin HFA 34mcg Inhale 2 puffs into the lungs every 6 hours as needed for wheezing or shortness of breath # 3 (replaces Albuterol)--Printed [employer] application-mailing patient her portion to sign & return, also sending script to [facility] for Shannon to sign.Delos Haring

## 2018-07-07 ENCOUNTER — Ambulatory Visit: Payer: Medicaid Other | Admitting: Ophthalmology

## 2018-07-14 ENCOUNTER — Ambulatory Visit: Payer: Medicaid Other | Admitting: Urology

## 2018-07-14 ENCOUNTER — Encounter: Payer: Self-pay | Admitting: Urology

## 2018-07-14 VITALS — BP 145/79 | HR 92 | Temp 97.6°F | Ht 62.0 in | Wt 182.2 lb

## 2018-07-14 DIAGNOSIS — I10 Essential (primary) hypertension: Secondary | ICD-10-CM

## 2018-07-14 DIAGNOSIS — E119 Type 2 diabetes mellitus without complications: Secondary | ICD-10-CM

## 2018-07-14 MED ORDER — CYCLOBENZAPRINE HCL 10 MG PO TABS: 10.0000 mg | ORAL_TABLET | Freq: Three times a day (TID) | ORAL | 0 refills | Status: DC | PRN

## 2018-07-14 NOTE — Progress Notes (Signed)
Patient: Samantha Deleon Female    DOB: [birth year]   52 y.o.   MRN: [id number] Visit Date: 07/14/2018  Today's Provider:   No chief complaint on file.  Subjective:    HPI Treated for URI - abated  SOB has abated with refills of inhalers  Cardiac workup in June 2019 was negative.  She is in need of dental care as she has a broken tooth  Allergies  Allergen Reactions  . Seasonal Ic [Cholestatin]    Previous Medications   ALBUTEROL (PROVENTIL HFA;VENTOLIN HFA) 108 (90 BASE) MCG/ACT INHALER    Inhale 2 puffs into the lungs every 6 (six) hours as needed for wheezing or shortness of breath.   AMLODIPINE (NORVASC) 10 MG TABLET    Take 10 mg by mouth daily.   AZITHROMYCIN (ZITHROMAX) 250 MG TABLET    Take 2 tabs first day, then 1 tab each day for 4 days   CYCLOBENZAPRINE (FLEXERIL) 10 MG TABLET    Take 10 mg by mouth 3 (three) times daily as needed for muscle spasms.   DULOXETINE (CYMBALTA) 20 MG CAPSULE    Take 20 mg by mouth daily.   FLUTICASONE-SALMETEROL (ADVAIR DISKUS) 100-50 MCG/DOSE AEPB    Inhale 1 puff into the lungs 2 (two) times daily.   GABAPENTIN (NEURONTIN) 100 MG CAPSULE    Take 100 mg by mouth at bedtime.   LISINOPRIL (PRINIVIL,ZESTRIL) 10 MG TABLET    Take 10 mg by mouth daily.   METHYLPREDNISOLONE (MEDROL) 4 MG TABLET    Take 4 mg by mouth taper from 4 doses each day to 1 dose and stop.    Review of Systems  Constitutional: Negative.   HENT: Negative.   Eyes: Negative.   Respiratory: Negative.   Cardiovascular: Negative.   Gastrointestinal: Negative.   Endocrine: Negative.   Genitourinary: Negative.   Musculoskeletal: Negative.   Skin: Negative.   Allergic/Immunologic: Negative.   Neurological: Negative.   Hematological: Negative.   Psychiatric/Behavioral: Negative.     Social History   Tobacco Use  . Smoking status: Never Smoker  . Smokeless tobacco: Never Used  Substance Use Topics  . Alcohol use: No   Objective:   BP (!) 145/79 (BP Location:  Right Arm, Patient Position: Sitting)   Pulse 92   Temp 97.6 F (36.4 C) (Oral)   Ht 5\' 2"  (1.575 m)   Wt 182 lb 3.2 oz (82.6 kg)   BMI 33.32 kg/m   Physical Exam  Constitutional: She is oriented to person, place, and time. She appears well-developed and well-nourished.  HENT:  Head: Normocephalic and atraumatic.  Mouth/Throat: Oropharynx is clear and moist.    Several teeth broken and/or missing   Eyes: Pupils are equal, round, and reactive to light. EOM are normal.  Neck: Normal range of motion.  Cardiovascular: Normal rate, regular rhythm and normal heart sounds.  Pulmonary/Chest: Effort normal and breath sounds normal.  Abdominal: Soft.  Musculoskeletal: Normal range of motion.       Right lower leg: Normal.       Left lower leg: Normal.  Neurological: She is alert and oriented to person, place, and time.  Skin: Skin is warm and dry.  Psychiatric: She has a normal mood and affect. Her behavior is normal.        Assessment & Plan:     1. Asthma Has received inhalers SOB has abated  2. 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

## 2018-07-19 ENCOUNTER — Ambulatory Visit: Payer: Medicaid Other | Admitting: Gerontology

## 2018-07-20 ENCOUNTER — Other Ambulatory Visit: Payer: Self-pay

## 2018-07-20 ENCOUNTER — Encounter: Payer: Self-pay | Admitting: Gerontology

## 2018-07-20 ENCOUNTER — Ambulatory Visit: Payer: Medicaid Other | Admitting: Gerontology

## 2018-07-20 VITALS — BP 134/81 | HR 85 | Temp 98.0°F | Ht 62.0 in | Wt 184.8 lb

## 2018-07-20 DIAGNOSIS — R05 Cough: Secondary | ICD-10-CM

## 2018-07-20 DIAGNOSIS — R059 Cough, unspecified: Secondary | ICD-10-CM

## 2018-07-20 MED ORDER — BENZONATATE 100 MG PO CAPS: 100.0000 mg | ORAL_CAPSULE | Freq: Three times a day (TID) | ORAL | 0 refills | Status: DC | PRN

## 2018-07-20 NOTE — Progress Notes (Signed)
Patient: Samantha Deleon Female    DOB: 04-Aug-1966   52 y.o.   MRN: [id number] Visit Date: 07/20/2018  Today's Provider: Rolm Gala, NP   Chief Complaint  Patient presents with  . Follow-up    cough   Subjective:    HPI  Samantha Deleon 52 y/o female presents with c/o constant productive cough, with thick clear phlegm that has being going on for 3 days that wakes her up at night. She denies taking any medication for the cough. She denies fever, chills, wheezing, sinus congestion, chest pain, palpitation, sore throat and headache. She experiences mild intermittent shortness of breath with exertion and uses Albuterol inhaler as needed.  She reported being treated for URI with Z-pack antibiotics and Prednisone taper 10/29.     Allergies  Allergen Reactions  . Seasonal Ic [Cholestatin]    Previous Medications   ALBUTEROL (PROVENTIL HFA;VENTOLIN HFA) 108 (90 BASE) MCG/ACT INHALER    Inhale 2 puffs into the lungs every 6 (six) hours as needed for wheezing or shortness of breath.   AMLODIPINE (NORVASC) 10 MG TABLET    Take 10 mg by mouth daily.   CYCLOBENZAPRINE (FLEXERIL) 10 MG TABLET    Take 1 tablet (10 mg total) by mouth 3 (three) times daily as needed for muscle spasms.   DULOXETINE (CYMBALTA) 20 MG CAPSULE    Take 20 mg by mouth daily.   FLUTICASONE-SALMETEROL (ADVAIR DISKUS) 100-50 MCG/DOSE AEPB    Inhale 1 puff into the lungs 2 (two) times daily.   GABAPENTIN (NEURONTIN) 100 MG CAPSULE    Take 100 mg by mouth at bedtime.   LISINOPRIL (PRINIVIL,ZESTRIL) 10 MG TABLET    Take 10 mg by mouth daily.    Review of Systems  HENT: Negative.   Respiratory: Negative.   Cardiovascular: Negative.   Gastrointestinal: Negative.   Neurological: Negative.     Social History   Tobacco Use  . Smoking status: Never Smoker  . Smokeless tobacco: Never Used  Substance Use Topics  . Alcohol use: No   Objective:   BP 134/81 (BP Location: Left Arm, Patient Position: Sitting)   Pulse 85    Temp 98 F (36.7 C)   Ht 5\' 2"  (1.575 m)   Wt 184 lb 12.8 oz (83.8 kg)   SpO2 95%   BMI 33.80 kg/m   Physical Exam  Constitutional: She appears well-developed and well-nourished.  HENT:  Head: Normocephalic and atraumatic.  Eyes: Pupils are equal, round, and reactive to light. EOM are normal.  Neck: Normal range of motion. Neck supple.  Cardiovascular: Normal rate and regular rhythm.  Pulmonary/Chest: Effort normal and breath sounds normal.        Assessment & Plan:         1. Cough in adult  - benzonatate (TESSALON PERLES) 100 MG capsule; Take 1 capsule (100 mg total) by mouth 3 (three) times daily as needed for cough.  Dispense: 20 capsule; Refill: 0 -Adequate hydration, rest    Samantha Marsalis Trellis Paganini, NP   [facility] [place]

## 2018-08-11 ENCOUNTER — Telehealth: Payer: Self-pay | Admitting: Adult Health Nurse Practitioner

## 2018-08-11 ENCOUNTER — Ambulatory Visit: Payer: Medicaid Other | Admitting: Urology

## 2018-08-11 VITALS — BP 132/84 | HR 88 | Temp 98.0°F | Ht 62.21 in | Wt 179.4 lb

## 2018-08-11 DIAGNOSIS — R05 Cough: Secondary | ICD-10-CM

## 2018-08-11 DIAGNOSIS — R059 Cough, unspecified: Secondary | ICD-10-CM

## 2018-08-11 MED ORDER — BENZONATATE 100 MG PO CAPS: 100.0000 mg | ORAL_CAPSULE | Freq: Three times a day (TID) | ORAL | 0 refills | Status: DC | PRN

## 2018-08-11 MED ORDER — CYCLOBENZAPRINE HCL 10 MG PO TABS: 10.0000 mg | ORAL_TABLET | Freq: Three times a day (TID) | ORAL | 0 refills | Status: DC | PRN

## 2018-08-11 NOTE — Progress Notes (Signed)
Patient: Samantha Deleon Female    DOB: [birth year]   52 y.o.   MRN: [id number] Visit Date: 08/11/2018  Today's Provider: Michiel Cowboy, PA-C   Chief Complaint  Patient presents with  . Cough    follow up   Subjective:    HPI  Patient returns for check up for cough that has been occurring since November.   Initially, she was coughing all day long.  Now, she is just coughing in the morning.    She states she is getting strangled at night.  She has been using Flonase as well.  The cough produces thick clear phlegm that wakes her up at night.  She denies fever, chills, wheezing, sinus congestion, chest pain, palpitation, sore throat and headache.  She experiences mild intermittent shortness of breath with exertion and uses Albuterol inhaler as needed.  She states that for a couple years, she has been having trouble with swallowing certain foods.  She has to go into the bathroom and work it up.      Allergies  Allergen Reactions  . Seasonal Ic [Cholestatin]    Previous Medications   ALBUTEROL (PROVENTIL HFA;VENTOLIN HFA) 108 (90 BASE) MCG/ACT INHALER    Inhale 2 puffs into the lungs every 6 (six) hours as needed for wheezing or shortness of breath.   AMLODIPINE (NORVASC) 10 MG TABLET    Take 10 mg by mouth daily.   BENZONATATE (TESSALON PERLES) 100 MG CAPSULE    Take 1 capsule (100 mg total) by mouth 3 (three) times daily as needed for cough.   CYCLOBENZAPRINE (FLEXERIL) 10 MG TABLET    Take 1 tablet (10 mg total) by mouth 3 (three) times daily as needed for muscle spasms.   DULOXETINE (CYMBALTA) 20 MG CAPSULE    Take 20 mg by mouth daily.   FLUTICASONE-SALMETEROL (ADVAIR DISKUS) 100-50 MCG/DOSE AEPB    Inhale 1 puff into the lungs 2 (two) times daily.   GABAPENTIN (NEURONTIN) 100 MG CAPSULE    Take 100 mg by mouth at bedtime.   LISINOPRIL (PRINIVIL,ZESTRIL) 10 MG TABLET    Take 10 mg by mouth daily.    Review of Systems  HENT: Negative.   Respiratory: Positive for cough.    Cardiovascular: Negative.   Gastrointestinal: Negative.   Neurological: Negative.     Social History   Tobacco Use  . Smoking status: Never Smoker  . Smokeless tobacco: Never Used  Substance Use Topics  . Alcohol use: No   Objective:   BP 132/84   Pulse 88   Temp 98 F (36.7 C)   Ht 5' 2.21" (1.58 m)   Wt 179 lb 6.4 oz (81.4 kg)   BMI 32.60 kg/m   Constitutional: Well nourished. Alert and oriented, No acute distress. HEENT: [place] AT, moist mucus membranes. Trachea midline, no masses. Cardiovascular: No clubbing, cyanosis, or edema. Respiratory: Normal respiratory effort, no increased work of breathing. Skin: No rashes, bruises or suspicious lesions. Neurologic: Grossly intact, no focal deficits, moving all 4 extremities. Psychiatric: Normal mood and affect.      Assessment & Plan:         1. Cough in adult - refills benzonatate (TESSALON PERLES) 100 MG capsule; Take 1 capsule (100 mg total) by mouth 3 (three) times daily as needed for cough.  Dispense: 20 capsule; Refill: 0 -Adequate hydration, rest - chest xray   2. Anxiety - needs refills on Cymbalta - needs appointment with Heather   3. Muscle spasms - refilled Flexeril  Michiel Cowboy, PA-C   [facility] [place]

## 2018-08-11 NOTE — Telephone Encounter (Signed)
Called about Dental Clinic. Needs to bring in $20.00 co-pay for Dental Clinic.

## 2018-08-16 ENCOUNTER — Institutional Professional Consult (permissible substitution): Payer: Medicaid Other | Admitting: Licensed Clinical Social Worker

## 2018-08-18 ENCOUNTER — Institutional Professional Consult (permissible substitution): Payer: Medicaid Other | Admitting: Licensed Clinical Social Worker

## 2018-09-01 ENCOUNTER — Ambulatory Visit: Payer: Medicaid Other

## 2018-09-08 ENCOUNTER — Ambulatory Visit: Payer: Medicaid Other

## 2018-09-08 ENCOUNTER — Ambulatory Visit: Payer: Medicaid Other | Admitting: Licensed Clinical Social Worker

## 2018-09-19 ENCOUNTER — Other Ambulatory Visit: Payer: Self-pay | Admitting: Internal Medicine

## 2018-09-19 DIAGNOSIS — R05 Cough: Secondary | ICD-10-CM

## 2018-09-19 DIAGNOSIS — R059 Cough, unspecified: Secondary | ICD-10-CM

## 2018-09-27 ENCOUNTER — Ambulatory Visit: Payer: Medicaid Other | Admitting: Licensed Clinical Social Worker

## 2018-09-27 DIAGNOSIS — F331 Major depressive disorder, recurrent, moderate: Secondary | ICD-10-CM
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
## 2018-09-29 ENCOUNTER — Ambulatory Visit: Payer: Medicaid Other

## 2018-10-06 ENCOUNTER — Ambulatory Visit: Payer: Medicaid Other

## 2018-10-11 ENCOUNTER — Ambulatory Visit: Payer: Medicaid Other | Admitting: Licensed Clinical Social Worker

## 2018-10-13 ENCOUNTER — Ambulatory Visit: Payer: Medicaid Other

## 2018-12-08 ENCOUNTER — Ambulatory Visit: Payer: Medicaid Other | Admitting: Pharmacist

## 2018-12-08 ENCOUNTER — Other Ambulatory Visit: Payer: Self-pay

## 2018-12-08 DIAGNOSIS — Z79899 Other long term (current) drug therapy: Secondary | ICD-10-CM

## 2018-12-08 NOTE — Progress Notes (Signed)
Medication Management Clinic Visit Note  Patient: Samantha Deleon MRN: [id number] Date of Birth: [birth year] PCP: No primary care provider on file.   Samantha Deleon 53 y.o. female presents for a Medication Management visit today.  There were no vitals taken for this visit.  Patient Information   Past Medical History:  Diagnosis Date  . Asthma   . Diabetes mellitus without complication (H[facility])   . Hypertension       Past Surgical History:  Procedure Laterality Date  . TUBAL LIGATION      No family history on file.              Social History   Substance and Sexual Activity  Alcohol Use No      Social History   Tobacco Use  Smoking Status Never Smoker  Smokeless Tobacco Never Used      Health Maintenance  Topic Date Due  . PNEUMOCOCCAL POLYSACCHARIDE VACCINE AGE 32-64 HIGH RISK  03/21/1970  . FOOT EXAM  03/21/1978  . OPHTHALMOLOGY EXAM  03/21/1978  . HIV Screening  03/22/1983  . TETANUS/TDAP  03/22/1987  . PAP SMEAR-Modifier  03/21/1989  . MAMMOGRAM  03/21/2018  . COLONOSCOPY  03/21/2018  . HEMOGLOBIN A1C  12/16/2018  . INFLUENZA VACCINE  04/01/2019     Assessment and Plan: 1. Pre-diabetes: Last A1c was 5.6 in 05/2018 (pre-diabetes cutoff is 5.7): patient is controlling this with diet and exercise. Plans to get back in with her dietician as soon as she can.  2. Hypertension (lisinopril, amlodipine): no current issues  2. Asthma (albuterol, Advair): is using albuterol more currently due to allergies. Discussed overuse and potential for increasing therapy if this continues. Does rinse mouth when uses Advair.  Patient endorses compliance except has been out of Cymbalta x 2 days. Is currently ready to be picked up. Informed patient we will follow-up with [facility] in 6 months. Encounter was limited by way of telephone call.   Mauri Reading, PharmD Pharmacy Resident  12/08/2018 9:41 AM

## 2018-12-12 ENCOUNTER — Encounter (INDEPENDENT_AMBULATORY_CARE_PROVIDER_SITE_OTHER): Payer: Self-pay

## 2018-12-12 ENCOUNTER — Other Ambulatory Visit: Payer: Self-pay

## 2019-03-23 ENCOUNTER — Telehealth: Payer: Self-pay | Admitting: Pharmacy Technician

## 2019-03-23 NOTE — Telephone Encounter (Signed)
Patient has Medicaid with prescription drug coverage.  No longer meets [facility]'s eligiblity criteria.  Patient notified.  [facility]

## 2019-07-13 ENCOUNTER — Ambulatory Visit: Payer: Medicaid Other | Admitting: Ophthalmology

## 2020-06-26 ENCOUNTER — Other Ambulatory Visit: Payer: Self-pay

## 2020-06-26 ENCOUNTER — Ambulatory Visit: Payer: Medicaid Other | Attending: General Surgery | Admitting: Physical Therapy

## 2020-06-26 ENCOUNTER — Encounter: Payer: Self-pay | Admitting: Physical Therapy

## 2020-06-26 DIAGNOSIS — M6281 Muscle weakness (generalized): Secondary | ICD-10-CM

## 2020-06-26 DIAGNOSIS — R262 Difficulty in walking, not elsewhere classified: Secondary | ICD-10-CM

## 2020-06-26 DIAGNOSIS — M25551 Pain in right hip: Secondary | ICD-10-CM | POA: Diagnosis present

## 2020-06-26 NOTE — Therapy (Signed)
[facility] [address], [place], [address] Phone: [phone]   Fax:  [phone]  Physical Therapy Evaluation  Patient Details  Name: Samantha Deleon MRN: [id number] Date of Birth: [birth year] Referring Provider (PT): [place], [place]    Encounter Date: 06/26/2020   PT End of Session - 06/26/20 1547    Visit Number 1    Number of Visits 12    Date for PT Re-Evaluation 09/18/20    Authorization Type MEDICAID [place] ACCESS reporting period from 06/26/2020    Authorization - Visit Number 1    Authorization - Number of Visits 1    Progress Note Due on Visit 10    PT Start Time 6294    PT Stop Time 1530    PT Time Calculation (min) 52 min    Equipment Utilized During Treatment --   RW   Activity Tolerance Patient tolerated treatment well;No increased pain    Behavior During Therapy WFL for tasks assessed/performed           Past Medical History:  Diagnosis Date  . Asthma   . Diabetes mellitus without complication ([place])   . Hypertension     Past Surgical History:  Procedure Laterality Date  . TUBAL LIGATION      There were no vitals filed for this visit.    Subjective Assessment - 06/26/20 1458    Subjective Patient states she is her for rehabilitation following R THA on 06/12/2020. She has had problems for about 10-11 years. She had a lot of tests and at first they thought it was her back but eventually determined she needed a hip replaced. She was supposed to have the replacement last year but it got delayed due to [place] issues at the hospital. She reports it feels a lot better since surgery. She has been told the left hip has gotten worse and she may need a replacement on that side too. She used to fall and her legs would give out until she started walking with the walker. She was walking with a SPC prior to her surgery for about 2 months. She is now walking with the walker most of the time.  Has been staying with one daughter that has an easier entry to the home but would like to go back to her usual home where she lives with her mother and other daughter. Stairs there have two steps to a landing then two more steps to enter and one step inside the house. Has handrails on both sides to enter the house but can only reach one at a time. 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 Usually works 7 hours at a time. Spends most of that time on her feet cleaning, walking, bending, lifting and carrying small tubs of chemicals.    Pertinent History Patient is a 54 y.o. 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 This patient's chief complaints consist of pain, weakness, stiffness, and decreased function leading to the following functional deficits: difficulty with with ADLs, IADLs, community and household ambulation, lifting, carrying, transfers, cleaning, caregiving, dressing, driving, jumping, stairs, working, standing. .Relevant past medical history and comorbidities include hypertension, diabetes type II, hypertension, depression (sees someone currently and is satisfied with level of care).  Patient denies hx of cancer, stroke, seizures, lung problem besides asthma, major cardiac events,  Plan - 06/26/20 1551    Clinical Impression Statement Patient is a 54 y.o. female referred to outpatient physical therapy with a medical diagnosis of s/p R THA 06/12/2020 who presents with signs and symptoms consistent with R hip stiffness, pain, weakness s/p recent TKA after experiencing long term pain and dysfunction there over the last several years. 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  Patient presents with significant pain, ROM, joint stiffness, activity tolerance, balance, muscle performance (strength/power/endurance), motor control, skin integrity impairments and post op precautions that are limiting ability to complete her usual activities including ADLs, IADLs, community and  household ambulation, lifting, carrying, transfers, cleaning, caregiving, dressing, driving, jumping, stairs, working, standing without difficulty. Patient will benefit from skilled physical therapy intervention to address current body structure impairments and activity limitations to improve function and work towards goals set in current POC in order to return to prior level of function or maximal functional improvement    Personal Factors and Comorbidities Comorbidity 3+;Time since onset of injury/illness/exacerbation;Transportation;Fitness;Past/Current Experience;Social Background    Comorbidities Relevant past medical history and comorbidities include hypertension, diabetes type II, hx of low back pain, hypertension, depression (sees someone currently and is satisfied with level of care).    Examination-Activity Limitations Hygiene/Grooming;Squat;Lift;Stairs;Bend;Bed Mobility;Locomotion Level;Stand;Caring for Others;Carry;Transfers;Dressing    Examination-Participation Restrictions Cleaning;Community Activity;Occupation;Driving;Shop;Interpersonal Relationship    Stability/Clinical Decision Making Stable/Uncomplicated    Clinical Decision Making Low    Rehab Potential Good    PT Frequency --   1-2x/week   PT Duration 12 weeks    PT Treatment/Interventions ADLs/Self Care Home Management;Moist Heat;Electrical Stimulation;Gait training;DME Instruction;Stair training;Cryotherapy;Functional mobility training;Therapeutic activities;Therapeutic exercise;Balance training;Neuromuscular re-education;Patient/family education;Manual techniques;Dry needling;Passive range of motion;Spinal Manipulations;Joint Manipulations    PT Next Visit Plan work on gait training and assess for safety with less restrictive assistive device    PT Home Exercise Plan Medbridge  Access Code: [email or web address]    Consulted and Agree with Plan of Care Patient           Patient will benefit from skilled therapeutic intervention in order  to improve the following deficits and impairments:  Abnormal gait, Decreased knowledge of use of DME, Decreased skin integrity, Increased fascial restricitons, Pain, Cardiopulmonary status limiting activity, Decreased coordination, Decreased mobility, Impaired perceived functional ability, Hypomobility, Decreased strength, Decreased range of motion, Decreased endurance, Decreased activity tolerance, Decreased balance, Decreased knowledge of precautions, Difficulty walking  Visit Diagnosis: Pain in right hip  Muscle weakness (generalized)  Difficulty in walking, not elsewhere classified     Problem List Patient Active Problem List   Diagnosis Date Noted  . Hypertension 06/16/2018  . Diabetes mellitus without complication ([place]) 64/15/8309  . Acute nasopharyngitis 06/16/2018    Everlean Alstrom. Graylon Good, PT, DPT 06/26/20, 4:13 PM  [facility] [address], [place], [address] Phone: [phone]   Fax:  [phone]  Name: Samantha Deleon MRN: [id number] Date of Birth: 08/02/1966  [facility] [address], [place], [address] Phone: [phone]   Fax:  [phone]  Physical Therapy Evaluation  Patient Details  Name: Samantha Deleon MRN: [id number] Date of Birth: [birth year] Referring Provider (PT): [place], [place]    Encounter Date: 06/26/2020   PT End of Session - 06/26/20 1547    Visit Number 1    Number of Visits 12    Date for PT Re-Evaluation 09/18/20    Authorization Type MEDICAID [place] ACCESS reporting period from 06/26/2020    Authorization - Visit Number 1    Authorization - Number of Visits 1    Progress Note Due on Visit 10    PT Start Time 6294    PT Stop Time 1530    PT Time Calculation (min) 52 min    Equipment Utilized During Treatment --   RW   Activity Tolerance Patient tolerated treatment well;No increased pain    Behavior During Therapy WFL for tasks assessed/performed           Past Medical History:  Diagnosis Date  . Asthma   . Diabetes mellitus without complication ([place])   . Hypertension     Past Surgical History:  Procedure Laterality Date  . TUBAL LIGATION      There were no vitals filed for this visit.    Subjective Assessment - 06/26/20 1458    Subjective Patient states she is her for rehabilitation following R THA on 06/12/2020. She has had problems for about 10-11 years. She had a lot of tests and at first they thought it was her back but eventually determined she needed a hip replaced. She was supposed to have the replacement last year but it got delayed due to [place] issues at the hospital. She reports it feels a lot better since surgery. She has been told the left hip has gotten worse and she may need a replacement on that side too. She used to fall and her legs would give out until she started walking with the walker. She was walking with a SPC prior to her surgery for about 2 months. She is now walking with the walker most of the time.  Has been staying with one daughter that has an easier entry to the home but would like to go back to her usual home where she lives with her mother and other daughter. Stairs there have two steps to a landing then two more steps to enter and one step inside the house. Has handrails on both sides to enter the house but can only reach one at a time. 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 Usually works 7 hours at a time. Spends most of that time on her feet cleaning, walking, bending, lifting and carrying small tubs of chemicals.    Pertinent History Patient is a 54 y.o. 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 This patient's chief complaints consist of pain, weakness, stiffness, and decreased function leading to the following functional deficits: difficulty with with ADLs, IADLs, community and household ambulation, lifting, carrying, transfers, cleaning, caregiving, dressing, driving, jumping, stairs, working, standing. .Relevant past medical history and comorbidities include hypertension, diabetes type II, hypertension, depression (sees someone currently and is satisfied with level of care).  Patient denies hx of cancer, stroke, seizures, lung problem besides asthma, major cardiac events,  Plan - 06/26/20 1551    Clinical Impression Statement Patient is a 54 y.o. female referred to outpatient physical therapy with a medical diagnosis of s/p R THA 06/12/2020 who presents with signs and symptoms consistent with R hip stiffness, pain, weakness s/p recent TKA after experiencing long term pain and dysfunction there over the last several years. 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  Patient presents with significant pain, ROM, joint stiffness, activity tolerance, balance, muscle performance (strength/power/endurance), motor control, skin integrity impairments and post op precautions that are limiting ability to complete her usual activities including ADLs, IADLs, community and  household ambulation, lifting, carrying, transfers, cleaning, caregiving, dressing, driving, jumping, stairs, working, standing without difficulty. Patient will benefit from skilled physical therapy intervention to address current body structure impairments and activity limitations to improve function and work towards goals set in current POC in order to return to prior level of function or maximal functional improvement    Personal Factors and Comorbidities Comorbidity 3+;Time since onset of injury/illness/exacerbation;Transportation;Fitness;Past/Current Experience;Social Background    Comorbidities Relevant past medical history and comorbidities include hypertension, diabetes type II, hx of low back pain, hypertension, depression (sees someone currently and is satisfied with level of care).    Examination-Activity Limitations Hygiene/Grooming;Squat;Lift;Stairs;Bend;Bed Mobility;Locomotion Level;Stand;Caring for Others;Carry;Transfers;Dressing    Examination-Participation Restrictions Cleaning;Community Activity;Occupation;Driving;Shop;Interpersonal Relationship    Stability/Clinical Decision Making Stable/Uncomplicated    Clinical Decision Making Low    Rehab Potential Good    PT Frequency --   1-2x/week   PT Duration 12 weeks    PT Treatment/Interventions ADLs/Self Care Home Management;Moist Heat;Electrical Stimulation;Gait training;DME Instruction;Stair training;Cryotherapy;Functional mobility training;Therapeutic activities;Therapeutic exercise;Balance training;Neuromuscular re-education;Patient/family education;Manual techniques;Dry needling;Passive range of motion;Spinal Manipulations;Joint Manipulations    PT Next Visit Plan work on gait training and assess for safety with less restrictive assistive device    PT Home Exercise Plan Medbridge  Access Code: [email or web address]    Consulted and Agree with Plan of Care Patient           Patient will benefit from skilled therapeutic intervention in order  to improve the following deficits and impairments:  Abnormal gait, Decreased knowledge of use of DME, Decreased skin integrity, Increased fascial restricitons, Pain, Cardiopulmonary status limiting activity, Decreased coordination, Decreased mobility, Impaired perceived functional ability, Hypomobility, Decreased strength, Decreased range of motion, Decreased endurance, Decreased activity tolerance, Decreased balance, Decreased knowledge of precautions, Difficulty walking  Visit Diagnosis: Pain in right hip  Muscle weakness (generalized)  Difficulty in walking, not elsewhere classified     Problem List Patient Active Problem List   Diagnosis Date Noted  . Hypertension 06/16/2018  . Diabetes mellitus without complication ([place]) 64/15/8309  . Acute nasopharyngitis 06/16/2018    Everlean Alstrom. Graylon Good, PT, DPT 06/26/20, 4:13 PM  [facility] [address], [place], [address] Phone: [phone]   Fax:  [phone]  Name: Samantha Deleon MRN: [id number] Date of Birth: 08/02/1966  Plan - 06/26/20 1551    Clinical Impression Statement Patient is a 54 y.o. female referred to outpatient physical therapy with a medical diagnosis of s/p R THA 06/12/2020 who presents with signs and symptoms consistent with R hip stiffness, pain, weakness s/p recent TKA after experiencing long term pain and dysfunction there over the last several years. 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  Patient presents with significant pain, ROM, joint stiffness, activity tolerance, balance, muscle performance (strength/power/endurance), motor control, skin integrity impairments and post op precautions that are limiting ability to complete her usual activities including ADLs, IADLs, community and  household ambulation, lifting, carrying, transfers, cleaning, caregiving, dressing, driving, jumping, stairs, working, standing without difficulty. Patient will benefit from skilled physical therapy intervention to address current body structure impairments and activity limitations to improve function and work towards goals set in current POC in order to return to prior level of function or maximal functional improvement    Personal Factors and Comorbidities Comorbidity 3+;Time since onset of injury/illness/exacerbation;Transportation;Fitness;Past/Current Experience;Social Background    Comorbidities Relevant past medical history and comorbidities include hypertension, diabetes type II, hx of low back pain, hypertension, depression (sees someone currently and is satisfied with level of care).    Examination-Activity Limitations Hygiene/Grooming;Squat;Lift;Stairs;Bend;Bed Mobility;Locomotion Level;Stand;Caring for Others;Carry;Transfers;Dressing    Examination-Participation Restrictions Cleaning;Community Activity;Occupation;Driving;Shop;Interpersonal Relationship    Stability/Clinical Decision Making Stable/Uncomplicated    Clinical Decision Making Low    Rehab Potential Good    PT Frequency --   1-2x/week   PT Duration 12 weeks    PT Treatment/Interventions ADLs/Self Care Home Management;Moist Heat;Electrical Stimulation;Gait training;DME Instruction;Stair training;Cryotherapy;Functional mobility training;Therapeutic activities;Therapeutic exercise;Balance training;Neuromuscular re-education;Patient/family education;Manual techniques;Dry needling;Passive range of motion;Spinal Manipulations;Joint Manipulations    PT Next Visit Plan work on gait training and assess for safety with less restrictive assistive device    PT Home Exercise Plan Medbridge  Access Code: [email or web address]    Consulted and Agree with Plan of Care Patient           Patient will benefit from skilled therapeutic intervention in order  to improve the following deficits and impairments:  Abnormal gait, Decreased knowledge of use of DME, Decreased skin integrity, Increased fascial restricitons, Pain, Cardiopulmonary status limiting activity, Decreased coordination, Decreased mobility, Impaired perceived functional ability, Hypomobility, Decreased strength, Decreased range of motion, Decreased endurance, Decreased activity tolerance, Decreased balance, Decreased knowledge of precautions, Difficulty walking  Visit Diagnosis: Pain in right hip  Muscle weakness (generalized)  Difficulty in walking, not elsewhere classified     Problem List Patient Active Problem List   Diagnosis Date Noted  . Hypertension 06/16/2018  . Diabetes mellitus without complication ([place]) 64/15/8309  . Acute nasopharyngitis 06/16/2018    Everlean Alstrom. Graylon Good, PT, DPT 06/26/20, 4:13 PM  [facility] [address], [place], [address] Phone: [phone]   Fax:  [phone]  Name: Samantha Deleon MRN: [id number] Date of Birth: 08/02/1966

## 2020-07-04 ENCOUNTER — Ambulatory Visit: Payer: Medicaid Other | Admitting: Physical Therapy

## 2020-07-08 ENCOUNTER — Ambulatory Visit: Payer: Medicaid Other | Attending: General Surgery | Admitting: Physical Therapy

## 2020-07-08 ENCOUNTER — Encounter: Payer: Self-pay | Admitting: Physical Therapy

## 2020-07-08 ENCOUNTER — Other Ambulatory Visit: Payer: Self-pay

## 2020-07-08 DIAGNOSIS — M6281 Muscle weakness (generalized): Secondary | ICD-10-CM | POA: Diagnosis present

## 2020-07-08 DIAGNOSIS — R262 Difficulty in walking, not elsewhere classified: Secondary | ICD-10-CM | POA: Diagnosis present

## 2020-07-08 DIAGNOSIS — M25551 Pain in right hip: Secondary | ICD-10-CM | POA: Insufficient documentation

## 2020-07-08 NOTE — Therapy (Signed)
[facility] [address] [place], [address] Phone: [phone]   Fax:  [phone]  Physical Therapy Treatment  Patient Details  Name: Samantha Deleon MRN: [id number] Date of Birth: 1966-04-04 Referring Provider (PT): [place], [place]    Encounter Date: 07/08/2020   PT End of Session - 07/08/20 1949    Visit Number 2    Number of Visits 12    Date for PT Re-Evaluation 09/18/20    Authorization Type MEDICAID [place] ACCESS reporting period from 06/26/2020    Authorization Time Period CCME authorization 3 PT 07/04/2020-07/17/2020    Authorization - Visit Number 1    Authorization - Number of Visits 3    Progress Note Due on Visit 10    PT Start Time 1847    PT Stop Time 1935    PT Time Calculation (min) 48 min    Equipment Utilized During Treatment Gait belt    Activity Tolerance Patient tolerated treatment well;No increased pain    Behavior During Therapy WFL for tasks assessed/performed           Past Medical History:  Diagnosis Date  . Asthma   . Diabetes mellitus without complication ([place])   . Hypertension     Past Surgical History:  Procedure Laterality Date  . TUBAL LIGATION      There were no vitals filed for this visit.   Subjective Assessment - 07/08/20 1946    Subjective Patient reports she is feeling well and has no pain upon arrival, she saw her referring physician who advised her to maintain her posterior hip precautions and not to do too much cleaning or bending.  She states she is having difficulty reaching her feet during dressing and standing on her right leg when putting pants on.  States she has had some days where she felt like she could not get her legs apart which bothered her.  Reports her doctor took her off the walker and she has been walking only occasionally with a cane now mostly no assistive device.  Last week she walked around [place] [employer] [place] almost a mile and did  have to take some seated rests and use her cane.  Patient missed her last PT appointment because her daughter did not arrive in time to bring her.    Pertinent History Patient is a 54 y.o. 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 This patient's chief complaints consist of pain, weakness, stiffness, and decreased function leading to the following functional deficits: difficulty with with ADLs, IADLs, community and household ambulation, lifting, carrying, transfers, cleaning, caregiving, dressing, driving, jumping, stairs, working, standing. .Relevant past medical history and comorbidities include hypertension, diabetes type II, hypertension, depression (sees someone currently and is satisfied with level of care).  Patient denies hx of cancer, stroke, seizures, lung problem besides asthma, major cardiac events, unexplained weight loss, changes in bowel or bladder problems, new onset stumbling or dropping things.    Limitations Lifting;Standing;Walking;House hold activities;Other (comment)   difficulty with with ADLs, IADLs, community and household ambulation, lifting, carrying, transfers, cleaning, caregiving, dressing, driving, jumping, stairs, working, standing.   Diagnostic tests Radiograph report 06/12/2020: "FINDINGS: The right total hip arthroplasty is intact and articulates appropriately. No perihardware lucency or periprosthetic fracture. The left hip and pubic symphysis are approximated. Unchanged severe left hip osteoarthrosis. Soft tissue emphysema along the right thigh, likely postsurgical."    Patient Stated  1-2x/week   PT Duration 12 weeks    PT Treatment/Interventions ADLs/Self Care Home Management;Moist Heat;Electrical Stimulation;Gait training;DME Instruction;Stair training;Cryotherapy;Functional mobility training;Therapeutic activities;Therapeutic exercise;Balance training;Neuromuscular re-education;Patient/family education;Manual techniques;Dry needling;Passive range of motion;Spinal Manipulations;Joint Manipulations    PT Next Visit Plan continue with B LE and functional strengthening and mobility    PT Home Exercise Plan Medbridge  Access Code: [email or web address]    Consulted and Agree with Plan of Care Patient           Patient will benefit from skilled therapeutic intervention in order to improve the following deficits and impairments:  Abnormal gait, Decreased knowledge of use of DME, Decreased skin integrity, Increased fascial restricitons, Pain, Cardiopulmonary status limiting activity, Decreased coordination, Decreased mobility, Impaired perceived functional ability, Hypomobility, Decreased strength, Decreased range of motion, Decreased endurance, Decreased activity tolerance, Decreased balance, Decreased knowledge of precautions, Difficulty walking  Visit Diagnosis: Pain in right hip  Muscle weakness (generalized)  Difficulty in walking, not elsewhere classified     Problem List Patient Active Problem List   Diagnosis Date Noted  . Hypertension 06/16/2018  . Diabetes mellitus without complication ([place])  54/05/8118  . Acute nasopharyngitis 06/16/2018    Everlean Alstrom. Graylon Good, PT, DPT 07/08/20, 8:03 PM  [facility] [address] [place], [address] Phone: [phone]   Fax:  [phone]  Name: WILLO YOON MRN: [id number] Date of Birth: 07-27-1966  1-2x/week   PT Duration 12 weeks    PT Treatment/Interventions ADLs/Self Care Home Management;Moist Heat;Electrical Stimulation;Gait training;DME Instruction;Stair training;Cryotherapy;Functional mobility training;Therapeutic activities;Therapeutic exercise;Balance training;Neuromuscular re-education;Patient/family education;Manual techniques;Dry needling;Passive range of motion;Spinal Manipulations;Joint Manipulations    PT Next Visit Plan continue with B LE and functional strengthening and mobility    PT Home Exercise Plan Medbridge  Access Code: [email or web address]    Consulted and Agree with Plan of Care Patient           Patient will benefit from skilled therapeutic intervention in order to improve the following deficits and impairments:  Abnormal gait, Decreased knowledge of use of DME, Decreased skin integrity, Increased fascial restricitons, Pain, Cardiopulmonary status limiting activity, Decreased coordination, Decreased mobility, Impaired perceived functional ability, Hypomobility, Decreased strength, Decreased range of motion, Decreased endurance, Decreased activity tolerance, Decreased balance, Decreased knowledge of precautions, Difficulty walking  Visit Diagnosis: Pain in right hip  Muscle weakness (generalized)  Difficulty in walking, not elsewhere classified     Problem List Patient Active Problem List   Diagnosis Date Noted  . Hypertension 06/16/2018  . Diabetes mellitus without complication ([place])  54/05/8118  . Acute nasopharyngitis 06/16/2018    Everlean Alstrom. Graylon Good, PT, DPT 07/08/20, 8:03 PM  [facility] [address] [place], [address] Phone: [phone]   Fax:  [phone]  Name: WILLO YOON MRN: [id number] Date of Birth: 07-27-1966  [facility] [address] [place], [address] Phone: [phone]   Fax:  [phone]  Physical Therapy Treatment  Patient Details  Name: Samantha Deleon MRN: [id number] Date of Birth: 1966-04-04 Referring Provider (PT): [place], [place]    Encounter Date: 07/08/2020   PT End of Session - 07/08/20 1949    Visit Number 2    Number of Visits 12    Date for PT Re-Evaluation 09/18/20    Authorization Type MEDICAID [place] ACCESS reporting period from 06/26/2020    Authorization Time Period CCME authorization 3 PT 07/04/2020-07/17/2020    Authorization - Visit Number 1    Authorization - Number of Visits 3    Progress Note Due on Visit 10    PT Start Time 1847    PT Stop Time 1935    PT Time Calculation (min) 48 min    Equipment Utilized During Treatment Gait belt    Activity Tolerance Patient tolerated treatment well;No increased pain    Behavior During Therapy WFL for tasks assessed/performed           Past Medical History:  Diagnosis Date  . Asthma   . Diabetes mellitus without complication ([place])   . Hypertension     Past Surgical History:  Procedure Laterality Date  . TUBAL LIGATION      There were no vitals filed for this visit.   Subjective Assessment - 07/08/20 1946    Subjective Patient reports she is feeling well and has no pain upon arrival, she saw her referring physician who advised her to maintain her posterior hip precautions and not to do too much cleaning or bending.  She states she is having difficulty reaching her feet during dressing and standing on her right leg when putting pants on.  States she has had some days where she felt like she could not get her legs apart which bothered her.  Reports her doctor took her off the walker and she has been walking only occasionally with a cane now mostly no assistive device.  Last week she walked around [place] [employer] almost a mile and did  have to take some seated rests and use her cane.  Patient missed her last PT appointment because her daughter did not arrive in time to bring her.    Pertinent History Patient is a 54 y.o. 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 This patient's chief complaints consist of pain, weakness, stiffness, and decreased function leading to the following functional deficits: difficulty with with ADLs, IADLs, community and household ambulation, lifting, carrying, transfers, cleaning, caregiving, dressing, driving, jumping, stairs, working, standing. .Relevant past medical history and comorbidities include hypertension, diabetes type II, hypertension, depression (sees someone currently and is satisfied with level of care).  Patient denies hx of cancer, stroke, seizures, lung problem besides asthma, major cardiac events, unexplained weight loss, changes in bowel or bladder problems, new onset stumbling or dropping things.    Limitations Lifting;Standing;Walking;House hold activities;Other (comment)   difficulty with with ADLs, IADLs, community and household ambulation, lifting, carrying, transfers, cleaning, caregiving, dressing, driving, jumping, stairs, working, standing.   Diagnostic tests Radiograph report 06/12/2020: "FINDINGS: The right total hip arthroplasty is intact and articulates appropriately. No perihardware lucency or periprosthetic fracture. The left hip and pubic symphysis are approximated. Unchanged severe left hip osteoarthrosis. Soft tissue emphysema along the right thigh, likely postsurgical."    Patient Stated

## 2020-07-10 ENCOUNTER — Other Ambulatory Visit: Payer: Self-pay

## 2020-07-10 ENCOUNTER — Encounter: Payer: Self-pay | Admitting: Physical Therapy

## 2020-07-10 ENCOUNTER — Ambulatory Visit: Payer: Medicaid Other | Admitting: Physical Therapy

## 2020-07-10 DIAGNOSIS — M25551 Pain in right hip: Secondary | ICD-10-CM | POA: Diagnosis not present

## 2020-07-10 DIAGNOSIS — R262 Difficulty in walking, not elsewhere classified: Secondary | ICD-10-CM

## 2020-07-10 DIAGNOSIS — M6281 Muscle weakness (generalized): Secondary | ICD-10-CM

## 2020-07-10 NOTE — Therapy (Signed)
[facility] [address] [place], [address] Phone: [phone]   Fax:  [phone]  Physical Therapy Treatment  Patient Details  Name: Samantha Deleon MRN: [id number] Date of Birth: 1966-03-01 Referring Provider (PT): [place], [place]    Encounter Date: 07/10/2020   PT End of Session - 07/10/20 1648    Visit Number 3    Number of Visits 12    Date for PT Re-Evaluation 09/18/20    Authorization Type MEDICAID [place] ACCESS reporting period from 06/26/2020    Authorization Time Period CCME authorization 3 PT 07/04/2020-07/17/2020    Authorization - Visit Number 2    Authorization - Number of Visits 3    Progress Note Due on Visit 10    PT Start Time 8299    PT Stop Time 1427    PT Time Calculation (min) 40 min    Equipment Utilized During Treatment Gait belt    Activity Tolerance Patient tolerated treatment well;No increased pain    Behavior During Therapy WFL for tasks assessed/performed           Past Medical History:  Diagnosis Date  . Asthma   . Diabetes mellitus without complication ([place])   . Hypertension     Past Surgical History:  Procedure Laterality Date  . TUBAL LIGATION      There were no vitals filed for this visit.   Subjective Assessment - 07/10/20 1352    Subjective Patient reports she was sore yesterday after her session the day before and is having pain of 6 out of 10 at the right lateral hip.  Patient reports she felt fine following last session except some heaviness in the right leg and fatigue.  She was not sore till the next day.  Tried a few home exercises yesterday successfully.  She does not have any questions about her home exercise program.  Also did a lot of walking yesterday.  Arrived with single-point cane today but does not want to use it in the clinic. [name] report she feels like her walking and hip mobility improved significantly following last session and her  family noticed she was walking better.    Pertinent History Patient is a 54 y.o. 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 This patient's chief complaints consist of pain, weakness, stiffness, and decreased function leading to the following functional deficits: difficulty with with ADLs, IADLs, community and household ambulation, lifting, carrying, transfers, cleaning, caregiving, dressing, driving, jumping, stairs, working, standing. .Relevant past medical history and comorbidities include hypertension, diabetes type II, hypertension, depression (sees someone currently and is satisfied with level of care).  Patient denies hx of cancer, stroke, seizures, lung problem besides asthma, major cardiac events, unexplained weight loss, changes in bowel or bladder problems, new onset stumbling or dropping things.    Limitations Lifting;Standing;Walking;House hold activities;Other (comment)   difficulty with with ADLs, IADLs, community and household ambulation, lifting, carrying, transfers, cleaning, caregiving, dressing, driving, jumping, stairs, working, standing.   Diagnostic tests Radiograph report 06/12/2020: "FINDINGS: The right total hip arthroplasty is intact and articulates appropriately. No perihardware lucency or periprosthetic fracture. The left hip and pubic symphysis are approximated. Unchanged severe left hip osteoarthrosis. Soft tissue emphysema along the right thigh, likely postsurgical."    Patient Stated Goals wants to get off the walker and her function back. Wants to go back to work.    Currently in Pain? Yes  to PLOF or maximal functional independence.    Personal Factors and Comorbidities Comorbidity 3+;Time since onset of injury/illness/exacerbation;Transportation;Fitness;Past/Current Experience;Social Background    Comorbidities Relevant past medical history and comorbidities include hypertension, diabetes type II, hx of low back pain, hypertension, depression (sees someone currently and is satisfied with level of care).    Examination-Activity Limitations Hygiene/Grooming;Squat;Lift;Stairs;Bend;Bed Mobility;Locomotion Level;Stand;Caring for Others;Carry;Transfers;Dressing    Examination-Participation Restrictions Cleaning;Community Activity;Occupation;Driving;Shop;Interpersonal Relationship    Stability/Clinical Decision Making Stable/Uncomplicated    Rehab Potential Good    PT Frequency --   1-2x/week   PT Duration 12 weeks    PT Treatment/Interventions ADLs/Self Care Home Management;Moist Heat;Electrical Stimulation;Gait training;DME Instruction;Stair training;Cryotherapy;Functional mobility training;Therapeutic activities;Therapeutic exercise;Balance training;Neuromuscular re-education;Patient/family education;Manual techniques;Dry needling;Passive range of motion;Spinal Manipulations;Joint Manipulations    PT Next Visit Plan continue with B LE and functional strengthening and mobility    PT Home Exercise Plan Medbridge  Access Code: [email or web address]    Consulted and Agree with Plan of Care Patient           Patient will benefit from skilled therapeutic intervention in order to improve the following deficits and impairments:  Abnormal  gait, Decreased knowledge of use of DME, Decreased skin integrity, Increased fascial restricitons, Pain, Cardiopulmonary status limiting activity, Decreased coordination, Decreased mobility, Impaired perceived functional ability, Hypomobility, Decreased strength, Decreased range of motion, Decreased endurance, Decreased activity tolerance, Decreased balance, Decreased knowledge of precautions, Difficulty walking  Visit Diagnosis: Pain in right hip  Muscle weakness (generalized)  Difficulty in walking, not elsewhere classified     Problem List Patient Active Problem List   Diagnosis Date Noted  . Hypertension 06/16/2018  . Diabetes mellitus without complication ([place]) 81/77/1165  . Acute nasopharyngitis 06/16/2018   Everlean Alstrom. Graylon Good, PT, DPT 07/10/20, 7:04 PM  [facility] [address] [place], [address] Phone: [phone]   Fax:  [phone]  Name: Samantha Deleon MRN: [id number] Date of Birth: 04-07-1966  to PLOF or maximal functional independence.    Personal Factors and Comorbidities Comorbidity 3+;Time since onset of injury/illness/exacerbation;Transportation;Fitness;Past/Current Experience;Social Background    Comorbidities Relevant past medical history and comorbidities include hypertension, diabetes type II, hx of low back pain, hypertension, depression (sees someone currently and is satisfied with level of care).    Examination-Activity Limitations Hygiene/Grooming;Squat;Lift;Stairs;Bend;Bed Mobility;Locomotion Level;Stand;Caring for Others;Carry;Transfers;Dressing    Examination-Participation Restrictions Cleaning;Community Activity;Occupation;Driving;Shop;Interpersonal Relationship    Stability/Clinical Decision Making Stable/Uncomplicated    Rehab Potential Good    PT Frequency --   1-2x/week   PT Duration 12 weeks    PT Treatment/Interventions ADLs/Self Care Home Management;Moist Heat;Electrical Stimulation;Gait training;DME Instruction;Stair training;Cryotherapy;Functional mobility training;Therapeutic activities;Therapeutic exercise;Balance training;Neuromuscular re-education;Patient/family education;Manual techniques;Dry needling;Passive range of motion;Spinal Manipulations;Joint Manipulations    PT Next Visit Plan continue with B LE and functional strengthening and mobility    PT Home Exercise Plan Medbridge  Access Code: [email or web address]    Consulted and Agree with Plan of Care Patient           Patient will benefit from skilled therapeutic intervention in order to improve the following deficits and impairments:  Abnormal  gait, Decreased knowledge of use of DME, Decreased skin integrity, Increased fascial restricitons, Pain, Cardiopulmonary status limiting activity, Decreased coordination, Decreased mobility, Impaired perceived functional ability, Hypomobility, Decreased strength, Decreased range of motion, Decreased endurance, Decreased activity tolerance, Decreased balance, Decreased knowledge of precautions, Difficulty walking  Visit Diagnosis: Pain in right hip  Muscle weakness (generalized)  Difficulty in walking, not elsewhere classified     Problem List Patient Active Problem List   Diagnosis Date Noted  . Hypertension 06/16/2018  . Diabetes mellitus without complication ([place]) 81/77/1165  . Acute nasopharyngitis 06/16/2018   Everlean Alstrom. Graylon Good, PT, DPT 07/10/20, 7:04 PM  [facility] [address] [place], [address] Phone: [phone]   Fax:  [phone]  Name: Samantha Deleon MRN: [id number] Date of Birth: 04-07-1966  to PLOF or maximal functional independence.    Personal Factors and Comorbidities Comorbidity 3+;Time since onset of injury/illness/exacerbation;Transportation;Fitness;Past/Current Experience;Social Background    Comorbidities Relevant past medical history and comorbidities include hypertension, diabetes type II, hx of low back pain, hypertension, depression (sees someone currently and is satisfied with level of care).    Examination-Activity Limitations Hygiene/Grooming;Squat;Lift;Stairs;Bend;Bed Mobility;Locomotion Level;Stand;Caring for Others;Carry;Transfers;Dressing    Examination-Participation Restrictions Cleaning;Community Activity;Occupation;Driving;Shop;Interpersonal Relationship    Stability/Clinical Decision Making Stable/Uncomplicated    Rehab Potential Good    PT Frequency --   1-2x/week   PT Duration 12 weeks    PT Treatment/Interventions ADLs/Self Care Home Management;Moist Heat;Electrical Stimulation;Gait training;DME Instruction;Stair training;Cryotherapy;Functional mobility training;Therapeutic activities;Therapeutic exercise;Balance training;Neuromuscular re-education;Patient/family education;Manual techniques;Dry needling;Passive range of motion;Spinal Manipulations;Joint Manipulations    PT Next Visit Plan continue with B LE and functional strengthening and mobility    PT Home Exercise Plan Medbridge  Access Code: [email or web address]    Consulted and Agree with Plan of Care Patient           Patient will benefit from skilled therapeutic intervention in order to improve the following deficits and impairments:  Abnormal  gait, Decreased knowledge of use of DME, Decreased skin integrity, Increased fascial restricitons, Pain, Cardiopulmonary status limiting activity, Decreased coordination, Decreased mobility, Impaired perceived functional ability, Hypomobility, Decreased strength, Decreased range of motion, Decreased endurance, Decreased activity tolerance, Decreased balance, Decreased knowledge of precautions, Difficulty walking  Visit Diagnosis: Pain in right hip  Muscle weakness (generalized)  Difficulty in walking, not elsewhere classified     Problem List Patient Active Problem List   Diagnosis Date Noted  . Hypertension 06/16/2018  . Diabetes mellitus without complication ([place]) 81/77/1165  . Acute nasopharyngitis 06/16/2018   Everlean Alstrom. Graylon Good, PT, DPT 07/10/20, 7:04 PM  [facility] [address] [place], [address] Phone: [phone]   Fax:  [phone]  Name: Samantha Deleon MRN: [id number] Date of Birth: 04-07-1966

## 2020-07-16 ENCOUNTER — Ambulatory Visit: Payer: Medicaid Other | Admitting: Physical Therapy

## 2020-07-16 ENCOUNTER — Telehealth: Payer: Self-pay | Admitting: Physical Therapy

## 2020-07-16 NOTE — Telephone Encounter (Signed)
Called patient when she did not show up for her appointment today at 1:45pm. Cell number said could not be completed at this time. Home number had outgoing message stating it was for Samantha Deleon so left message asking Samantha Deleon to call back.  Call back number: [place]. Graylon Good, PT, DPT 07/16/20, 2:07 PM

## 2020-07-18 ENCOUNTER — Encounter: Payer: Medicaid Other | Admitting: Physical Therapy

## 2020-07-22 ENCOUNTER — Telehealth: Payer: Self-pay | Admitting: Physical Therapy

## 2020-07-22 NOTE — Telephone Encounter (Signed)
Called patient again after seeing her on the schedule tomorrow. Patient does not have insurance authorization to cover that visit due to waiting for patient to respond to [place] reaching out after she no-showed last visit to confirm she wants to continue PT. Unable to complete call to cell phone (error message) and left VM on home number asking patient to call back due to insurance auth issues. Requested call back ASAP to discuss scheduling and how to continue care if desired.  Call back number:  [place]. Graylon Good, PT, DPT 07/22/20, 9:34 AM

## 2020-07-23 ENCOUNTER — Ambulatory Visit: Payer: Medicaid Other | Admitting: Physical Therapy

## 2020-07-30 ENCOUNTER — Ambulatory Visit: Payer: Medicaid Other | Admitting: Physical Therapy

## 2020-08-01 ENCOUNTER — Encounter: Payer: Medicaid Other | Admitting: Physical Therapy

## 2020-08-05 ENCOUNTER — Encounter: Payer: Medicaid Other | Admitting: Physical Therapy

## 2020-08-07 ENCOUNTER — Encounter: Payer: Medicaid Other | Admitting: Physical Therapy

## 2020-08-12 ENCOUNTER — Encounter: Payer: Medicaid Other | Admitting: Physical Therapy

## 2020-08-14 ENCOUNTER — Encounter: Payer: Medicaid Other | Admitting: Physical Therapy

## 2020-09-23 ENCOUNTER — Encounter: Payer: Self-pay | Admitting: Physical Therapy

## 2020-09-23 DIAGNOSIS — R262 Difficulty in walking, not elsewhere classified: Secondary | ICD-10-CM

## 2020-09-23 DIAGNOSIS — M6281 Muscle weakness (generalized): Secondary | ICD-10-CM

## 2020-09-23 DIAGNOSIS — M25551 Pain in right hip: Secondary | ICD-10-CM

## 2020-09-23 NOTE — Therapy (Signed)
[facility] [address] [place], [address] Phone: [phone]   Fax:  [phone]  Physical Therapy No-Visit Discharge Summary Reporting period: 06/26/2020 - 07/10/2020  Patient Details  Name: Samantha Deleon MRN: [id number] Date of Birth: [birth year] Referring Provider (PT): [place], [place]    Encounter Date: 09/23/2020    Past Medical History:  Diagnosis Date  . Asthma   . Diabetes mellitus without complication ([place])   . Hypertension     Past Surgical History:  Procedure Laterality Date  . TUBAL LIGATION      There were no vitals filed for this visit.   Subjective Assessment - 09/23/20 1313    Subjective Patient stopped attending PT and did not respond to attempts to contact her following her 3rd visit.    Pertinent History Patient is a 55 y.o. 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 This patient's chief complaints consist of pain, weakness, stiffness, and decreased function leading to the following functional deficits: difficulty with with ADLs, IADLs, community and household ambulation, lifting, carrying, transfers, cleaning, caregiving, dressing, driving, jumping, stairs, working, standing. .Relevant past medical history and comorbidities include hypertension, diabetes type II, hypertension, depression (sees someone currently and is satisfied with level of care).  Patient denies hx of cancer, stroke, seizures, lung problem besides asthma, major cardiac events, unexplained weight loss, changes in bowel or bladder problems, new onset stumbling or dropping things.    Limitations Lifting;Standing;Walking;House hold activities;Other (comment)   difficulty with with ADLs, IADLs, community and household ambulation, lifting, carrying, transfers, cleaning, caregiving, dressing, driving, jumping, stairs, working, standing.   Diagnostic tests  Radiograph report 06/12/2020: "FINDINGS: The right total hip arthroplasty is intact and articulates appropriately. No perihardware lucency or periprosthetic fracture. The left hip and pubic symphysis are approximated. Unchanged severe left hip osteoarthrosis. Soft tissue emphysema along the right thigh, likely postsurgical."    Patient Stated Goals wants to get off the walker and her function back. Wants to go back to work.           OBJECTIVE Patient is not present for examination at this time. Please see previous documentation for latest objective data.      PT Short Term Goals - 07/08/20 1952      PT SHORT TERM GOAL #1   Title Be independent with initial home exercise program for self-management of symptoms.    Baseline Initial HEP provided at IE (06/26/2020);    Time 3    Period Weeks    Status Achieved    Target Date 07/17/20             PT Long Term Goals - 09/23/20 1318      PT LONG TERM GOAL #1   Title Be independent with a long-term home exercise program for self-management of symptoms.    Baseline Initial HEP provided at [facility] (06/26/2020); currently participating appropriately for level of rehab (07/10/2020);    Time 12    Period Weeks    Status Partially Met   TARGET DATE FOR ALL LONG TERM GOALS:  09/18/2020     PT LONG TERM GOAL #2   Title Demonstrate improved FOTO score by 10 units to demonstrate improvement in overall condition and self-reported functional ability.    Baseline 85 (06/26/2020);    Time 12    Period Weeks    Status Unable to assess      PT  [facility] [address] [place], [address] Phone: [phone]   Fax:  [phone]  Physical Therapy No-Visit Discharge Summary Reporting period: 06/26/2020 - 07/10/2020  Patient Details  Name: Samantha Deleon MRN: [id number] Date of Birth: [birth year] Referring Provider (PT): [place], [place]    Encounter Date: 09/23/2020    Past Medical History:  Diagnosis Date  . Asthma   . Diabetes mellitus without complication ([place])   . Hypertension     Past Surgical History:  Procedure Laterality Date  . TUBAL LIGATION      There were no vitals filed for this visit.   Subjective Assessment - 09/23/20 1313    Subjective Patient stopped attending PT and did not respond to attempts to contact her following her 3rd visit.    Pertinent History Patient is a 55 y.o. 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 This patient's chief complaints consist of pain, weakness, stiffness, and decreased function leading to the following functional deficits: difficulty with with ADLs, IADLs, community and household ambulation, lifting, carrying, transfers, cleaning, caregiving, dressing, driving, jumping, stairs, working, standing. .Relevant past medical history and comorbidities include hypertension, diabetes type II, hypertension, depression (sees someone currently and is satisfied with level of care).  Patient denies hx of cancer, stroke, seizures, lung problem besides asthma, major cardiac events, unexplained weight loss, changes in bowel or bladder problems, new onset stumbling or dropping things.    Limitations Lifting;Standing;Walking;House hold activities;Other (comment)   difficulty with with ADLs, IADLs, community and household ambulation, lifting, carrying, transfers, cleaning, caregiving, dressing, driving, jumping, stairs, working, standing.   Diagnostic tests  Radiograph report 06/12/2020: "FINDINGS: The right total hip arthroplasty is intact and articulates appropriately. No perihardware lucency or periprosthetic fracture. The left hip and pubic symphysis are approximated. Unchanged severe left hip osteoarthrosis. Soft tissue emphysema along the right thigh, likely postsurgical."    Patient Stated Goals wants to get off the walker and her function back. Wants to go back to work.           OBJECTIVE Patient is not present for examination at this time. Please see previous documentation for latest objective data.      PT Short Term Goals - 07/08/20 1952      PT SHORT TERM GOAL #1   Title Be independent with initial home exercise program for self-management of symptoms.    Baseline Initial HEP provided at IE (06/26/2020);    Time 3    Period Weeks    Status Achieved    Target Date 07/17/20             PT Long Term Goals - 09/23/20 1318      PT LONG TERM GOAL #1   Title Be independent with a long-term home exercise program for self-management of symptoms.    Baseline Initial HEP provided at [facility] (06/26/2020); currently participating appropriately for level of rehab (07/10/2020);    Time 12    Period Weeks    Status Partially Met   TARGET DATE FOR ALL LONG TERM GOALS:  09/18/2020     PT LONG TERM GOAL #2   Title Demonstrate improved FOTO score by 10 units to demonstrate improvement in overall condition and self-reported functional ability.    Baseline 85 (06/26/2020);    Time 12    Period Weeks    Status Unable to assess      PT  Difficulty in walking, not elsewhere classified     Problem List Patient Active Problem List   Diagnosis Date Noted  . Hypertension 06/16/2018  . Diabetes mellitus without complication ([place]) 88/50/2774  . Acute nasopharyngitis 06/16/2018    Everlean Alstrom. Graylon Good, PT, DPT 09/23/20, 1:20 PM  [facility] [address] [place], [address] Phone: [phone]   Fax:   [phone]  Name: Samantha Deleon MRN: [id number] Date of Birth: 1966/08/12

## 2020-11-15 ENCOUNTER — Emergency Department
Admission: EM | Admit: 2020-11-15 | Discharge: 2020-11-15 | Disposition: A | Discharge status: Home / Self Care | Payer: Medicare Other | Attending: Emergency Medicine | Admitting: Emergency Medicine

## 2020-11-15 ENCOUNTER — Emergency Department: Payer: Medicare Other

## 2020-11-15 ENCOUNTER — Encounter: Payer: Self-pay | Admitting: Emergency Medicine

## 2020-11-15 ENCOUNTER — Other Ambulatory Visit: Payer: Self-pay

## 2020-11-15 DIAGNOSIS — Z7951 Long term (current) use of inhaled steroids: Secondary | ICD-10-CM | POA: Insufficient documentation

## 2020-11-15 DIAGNOSIS — J45909 Unspecified asthma, uncomplicated: Secondary | ICD-10-CM | POA: Diagnosis not present

## 2020-11-15 DIAGNOSIS — I10 Essential (primary) hypertension: Secondary | ICD-10-CM | POA: Diagnosis not present

## 2020-11-15 DIAGNOSIS — E119 Type 2 diabetes mellitus without complications: Secondary | ICD-10-CM | POA: Diagnosis not present

## 2020-11-15 DIAGNOSIS — J069 Acute upper respiratory infection, unspecified: Secondary | ICD-10-CM | POA: Diagnosis not present

## 2020-11-15 DIAGNOSIS — Z7984 Long term (current) use of oral hypoglycemic drugs: Secondary | ICD-10-CM | POA: Insufficient documentation

## 2020-11-15 DIAGNOSIS — Z79899 Other long term (current) drug therapy: Secondary | ICD-10-CM | POA: Insufficient documentation

## 2020-11-15 DIAGNOSIS — R059 Cough, unspecified: Secondary | ICD-10-CM | POA: Diagnosis present

## 2020-11-15 DIAGNOSIS — Z96641 Presence of right artificial hip joint: Secondary | ICD-10-CM | POA: Insufficient documentation

## 2020-11-15 DIAGNOSIS — Z20822 Contact with and (suspected) exposure to covid-19: Secondary | ICD-10-CM | POA: Diagnosis not present

## 2020-11-15 LAB — SARS CORONAVIRUS 2 (TAT 6-24 HRS): SARS Coronavirus 2: NEGATIVE

## 2020-11-15 IMAGING — CR DG CHEST 2V
1 series · 2 of 2 positions shown · non-contrast
Comparison: [DATE]

CLINICAL DATA: Productive cough for 3 days

EXAM:
CHEST - 2 VIEW

[Series 1: dg chest 2 view · 0.14mm/px · 2 of 2 slices shown]
[im 1/2]
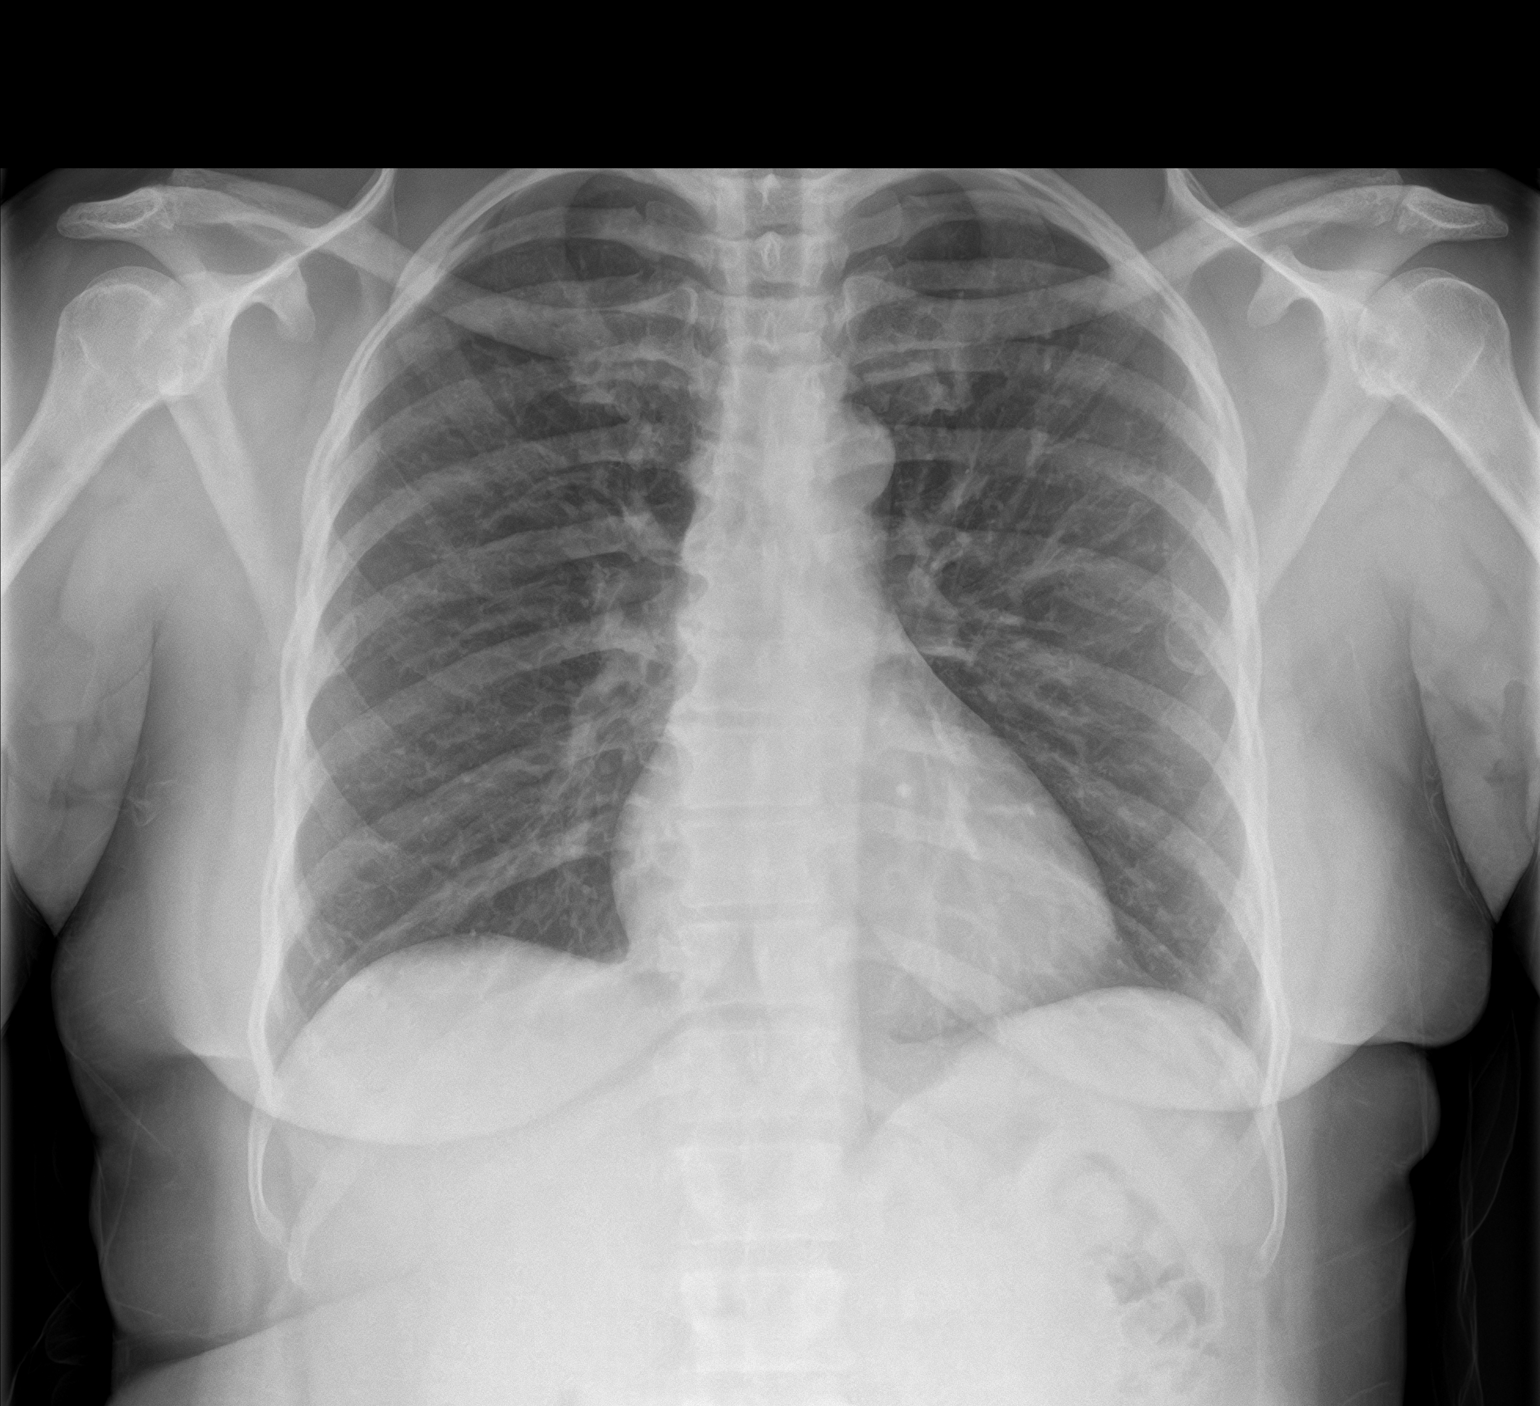
[im 2/2]
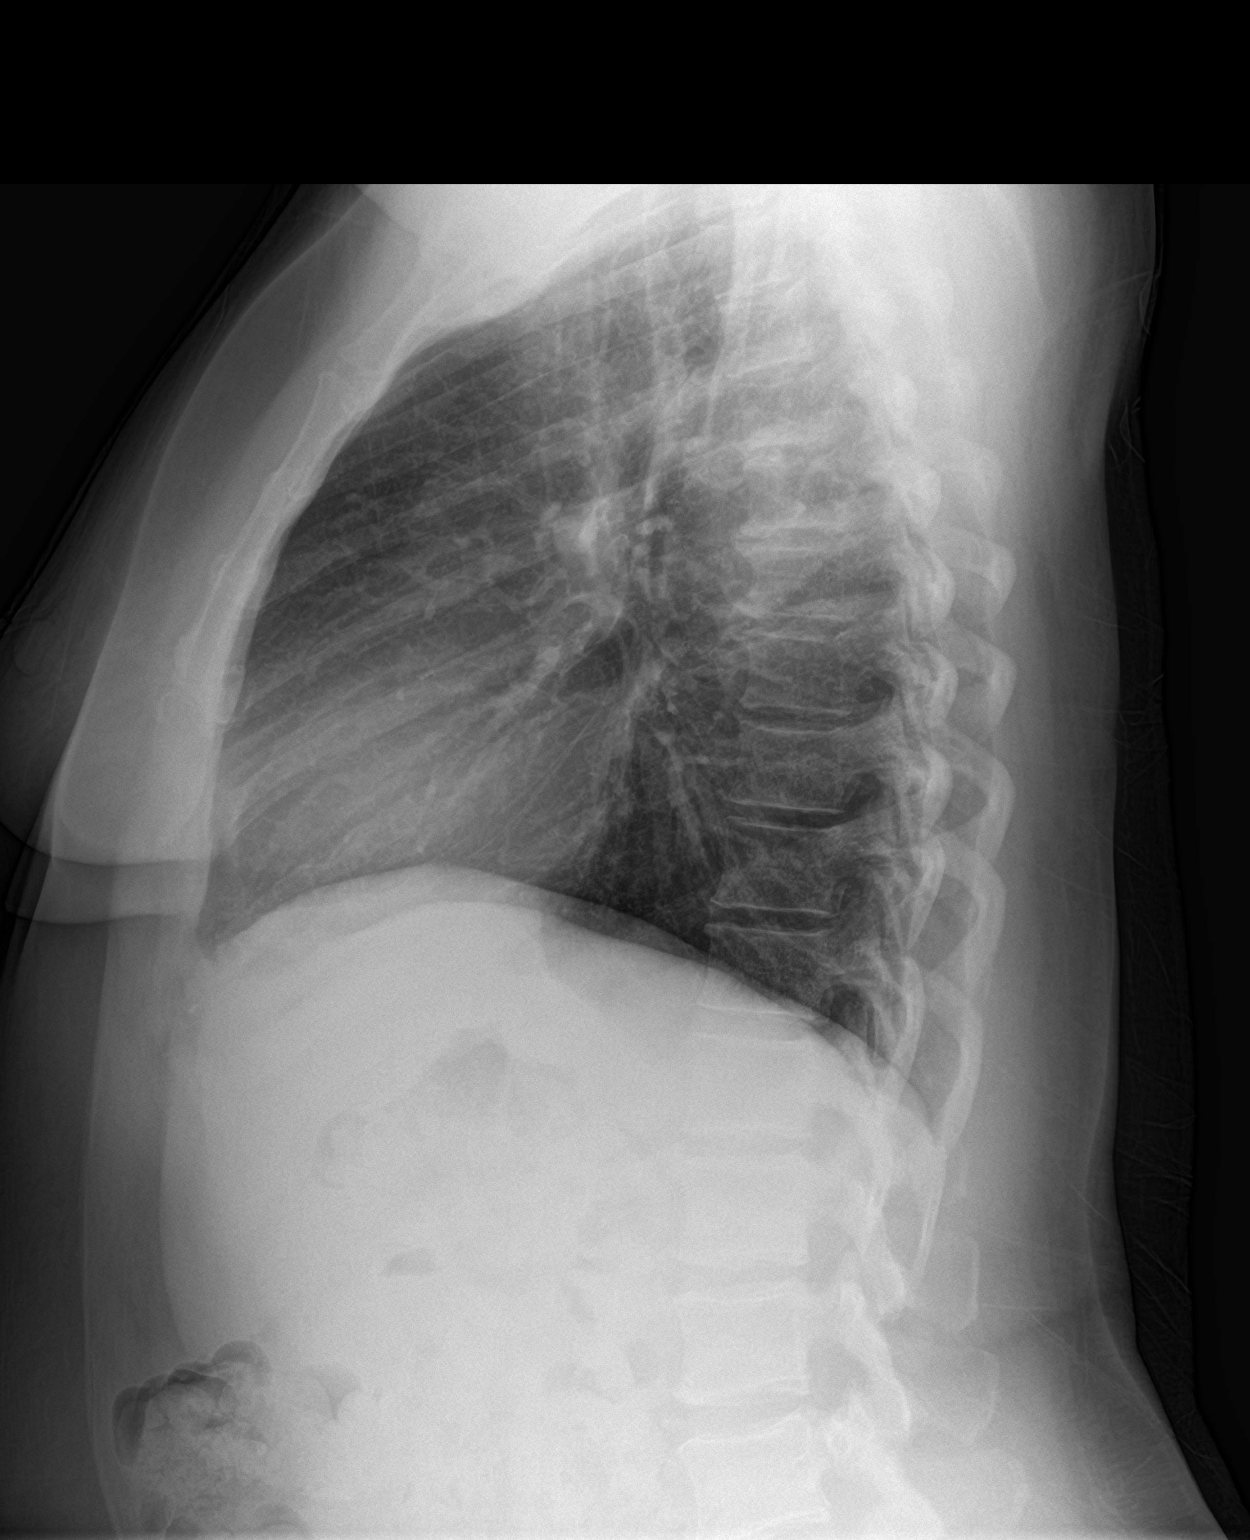

[2 of 2 positions shown; findings below may reference images not displayed]

FINDINGS: The heart size and mediastinal contours are within normal limits.
Both lungs are clear. The visualized skeletal structures are
unremarkable.
IMPRESSION: No acute abnormality of the lungs.

## 2020-11-15 MED ORDER — PSEUDOEPH-BROMPHEN-DM 30-2-10 MG/5ML PO SYRP: 5.0000 mL | ORAL_SOLUTION | Freq: Four times a day (QID) | ORAL | 0 refills | Status: DC | PRN

## 2020-11-15 NOTE — Discharge Instructions (Signed)
No acute findings on chest x-ray.  Follow discharge care instruction and take medication as directed.  Advised self quarantine pending results of COVID-19 test.  Results will be available later in the MyChart app.

## 2020-11-15 NOTE — ED Triage Notes (Signed)
Pt to ED via POV with c/o cough x several days. Pt states intermittently coughing up clear phlegm. Pt denies known exposure. Pt A&O x4, respirations even and unlabored.

## 2020-11-15 NOTE — ED Provider Notes (Addendum)
[facility] Emergency Department Provider Note   ____________________________________________   Event Date/Time   First MD Initiated Contact with Patient 11/15/20 0848     (approximate)  I have reviewed the triage vital signs and the nursing notes.   HISTORY  Chief Complaint Cough    HPI Samantha Deleon is a 55 y.o. female patient complaint of productive cough for 3 days.  Patient denies fever chills associated complaint.  Patient states there is nasal congestion with postnasal drainage.  Patient denies recent travel or known contact with COVID-19.  Patient is not taking the vaccine.  Patient not taken flu shot.  Status post right hip replacement 3 months ago.         Past Medical History:  Diagnosis Date  . Asthma   . Diabetes mellitus without complication ([place])   . Hypertension     Patient Active Problem List   Diagnosis Date Noted  . Hypertension 06/16/2018  . Diabetes mellitus without complication ([place]) 74/25/9563  . Acute nasopharyngitis 06/16/2018    Past Surgical History:  Procedure Laterality Date  . TUBAL LIGATION      Prior to Admission medications   Medication Sig Start Date End Date Taking? Authorizing Provider  brompheniramine-pseudoephedrine-DM 30-2-10 MG/5ML syrup Take 5 mLs by mouth 4 (four) times daily as needed. 11/15/20  Yes Sable Feil, PA-C  busPIRone (BUSPAR) 10 MG tablet Take 10 mg by mouth 3 (three) times daily.   Yes [provider]  loratadine (CLARITIN) 10 MG tablet Take 10 mg by mouth daily.   Yes [provider]  metFORMIN (GLUCOPHAGE) 500 MG tablet Take by mouth 2 (two) times daily with a meal.   Yes [provider]  mirtazapine (REMERON) 7.5 MG tablet Take 7.5 mg by mouth at bedtime.   Yes [provider]  prazosin (MINIPRESS) 1 MG capsule Take 1 mg by mouth at bedtime.   Yes [provider]  albuterol (PROVENTIL HFA;VENTOLIN HFA) 108 (90 Base) MCG/ACT inhaler  Inhale 2 puffs into the lungs every 6 (six) hours as needed for wheezing or shortness of breath. 06/28/18   Zara Council A, PA-C  amLODipine (NORVASC) 10 MG tablet Take 10 mg by mouth daily.    [provider]  DULoxetine (CYMBALTA) 20 MG capsule Take 20 mg by mouth daily.    [provider]  Fluticasone-Salmeterol (ADVAIR DISKUS) 100-50 MCG/DOSE AEPB Inhale 1 puff into the lungs 2 (two) times daily. 06/28/18   Zara Council A, PA-C  lisinopril (PRINIVIL,ZESTRIL) 10 MG tablet Take 10 mg by mouth daily.    [provider]    Allergies Seasonal ic [cholestatin]  History reviewed. No pertinent family history.  Social History Social History   Tobacco Use  . Smoking status: Never Smoker  . Smokeless tobacco: Never Used  Vaping Use  . Vaping Use: Never used  Substance Use Topics  . Alcohol use: No  . Drug use: No    Review of Systems Constitutional: No fever/chills Eyes: No visual changes. ENT: No sore throat. Cardiovascular: Denies chest pain. Respiratory: Denies shortness of breath. Gastrointestinal: No abdominal pain.  No nausea, no vomiting.  No diarrhea.  No constipation. Genitourinary: Negative for dysuria. Musculoskeletal: Negative for back pain. Skin: Negative for rash. Neurological: Negative for headaches, focal weakness or numbness. Endocrine:  Diabetes and hypertension Allergic/Immunilogical: Cholestatin ____________________________________________   PHYSICAL EXAM:  VITAL SIGNS: ED Triage Vitals  Enc Vitals Group     BP 11/15/20 0820 136/84  Pulse Rate 11/15/20 0820 89     Resp 11/15/20 0820 20     Temp 11/15/20 0820 98.3 F (36.8 C)     Temp Source 11/15/20 0820 Oral     SpO2 11/15/20 0820 96 %     Weight 11/15/20 0817 171 lb (77.6 kg)     Height 11/15/20 0817 5\' 2"  (1.575 m)     Head Circumference --      Peak Flow --      Pain Score 11/15/20 0817 0     Pain Loc --      Pain Edu? --      Excl. in [place]? --      Constitutional: Alert and oriented. Well appearing and in no acute distress. Eyes: Conjunctivae are normal. PERRL. EOMI. Head: Atraumatic. Nose: Edematous nasal turbinates clear rhinorrhea.   Mouth/Throat: Mucous membranes are moist.  Postnasal drainage.  Oropharynx non-erythematous. Neck: No stridor.   Hematological/Lymphatic/Immunilogical: No cervical lymphadenopathy. Cardiovascular: Normal rate, regular rhythm. Grossly normal heart sounds.  Good peripheral circulation. Respiratory: Normal respiratory effort.  No retractions. Lungs CTAB. Gastrointestinal: Soft and nontender. No distention. No abdominal bruits. No CVA tenderness. Genitourinary: Deferred Musculoskeletal: No lower extremity tenderness nor edema.  No joint effusions. Neurologic:  Normal speech and language. No gross focal neurologic deficits are appreciated. No gait instability. Skin:  Skin is warm, dry and intact. No rash noted. Psychiatric: Mood and affect are normal. Speech and behavior are normal.  ____________________________________________   LABS (all labs ordered are listed, but only abnormal results are displayed)  Labs Reviewed  SARS CORONAVIRUS 2 (TAT 6-24 HRS)   ____________________________________________  EKG   ____________________________________________  RADIOLOGY I, Sable Feil, personally viewed and evaluated these images (plain radiographs) as part of my medical decision making, as well as reviewing the written report by the radiologist.  ED MD interpretation: No acute findings on chest x-ray.   Official radiology report(s): DG Chest 2 View  Result Date: 11/15/2020 CLINICAL DATA:  Productive cough for 3 days EXAM: CHEST - 2 VIEW COMPARISON:  11/29/2017 FINDINGS: The heart size and mediastinal contours are within normal limits. Both lungs are clear. The visualized skeletal structures are unremarkable. IMPRESSION: No acute abnormality of the lungs. Electronically Signed   By: Eddie Candle  M.D.   On: 11/15/2020 09:54    ____________________________________________   PROCEDURES  Procedure(s) performed (including Critical Care):  Procedures   ____________________________________________   INITIAL IMPRESSION / ASSESSMENT AND PLAN / ED COURSE  As part of my medical decision making, I reviewed the following data within the [facility]         Patient presents with productive cough for 3 days.  Discussed no acute findings on chest x-ray.  Patient complaint and physical exam consistent with viral respiratory infection with cough.  Patient given discharge care instruction and advised to consider taking the Covid 19 vaccine.  Take medication as directed and follow-up with PCP.      ____________________________________________   FINAL CLINICAL IMPRESSION(S) / ED DIAGNOSES  Final diagnoses:  Viral URI with cough     ED Discharge Orders         Ordered    brompheniramine-pseudoephedrine-DM 30-2-10 MG/5ML syrup  4 times daily PRN        11/15/20 1003          *Please note:  Samantha Deleon was evaluated in Emergency Department on 11/15/2020 for the symptoms described in the history of present illness. She was evaluated  in the context of the global COVID-19 pandemic, which necessitated consideration that the patient might be at risk for infection with the SARS-CoV-2 virus that causes COVID-19. Institutional protocols and algorithms that pertain to the evaluation of patients at risk for COVID-19 are in a state of rapid change based on information released by regulatory bodies including the CDC and federal and state organizations. These policies and algorithms were followed during the patient's care in the ED.  Some ED evaluations and interventions may be delayed as a result of limited staffing during and the pandemic.*   Note:  This document was prepared using Dragon voice recognition software and may include unintentional dictation errors.     Sable Feil, PA-C 11/15/20 1039    Sable Feil, PA-C 11/15/20 1041    Carrie Mew, MD 11/15/20 1550

## 2020-12-10 DIAGNOSIS — R319 Hematuria, unspecified: Secondary | ICD-10-CM | POA: Diagnosis not present

## 2021-01-15 ENCOUNTER — Other Ambulatory Visit: Payer: Self-pay | Admitting: Emergency Medicine

## 2021-01-15 ENCOUNTER — Other Ambulatory Visit: Payer: Self-pay | Admitting: Internal Medicine

## 2021-01-15 DIAGNOSIS — R1310 Dysphagia, unspecified: Secondary | ICD-10-CM

## 2021-01-15 DIAGNOSIS — S46911A Strain of unspecified muscle, fascia and tendon at shoulder and upper arm level, right arm, initial encounter: Secondary | ICD-10-CM | POA: Diagnosis not present

## 2021-01-15 DIAGNOSIS — J019 Acute sinusitis, unspecified: Secondary | ICD-10-CM | POA: Diagnosis not present

## 2021-01-15 DIAGNOSIS — M79604 Pain in right leg: Secondary | ICD-10-CM

## 2021-01-15 DIAGNOSIS — M169 Osteoarthritis of hip, unspecified: Secondary | ICD-10-CM | POA: Diagnosis not present

## 2021-01-15 DIAGNOSIS — M542 Cervicalgia: Secondary | ICD-10-CM

## 2021-01-15 DIAGNOSIS — M545 Low back pain, unspecified: Secondary | ICD-10-CM

## 2021-01-15 DIAGNOSIS — E119 Type 2 diabetes mellitus without complications: Secondary | ICD-10-CM | POA: Diagnosis not present

## 2021-01-15 DIAGNOSIS — I10 Essential (primary) hypertension: Secondary | ICD-10-CM | POA: Diagnosis not present

## 2021-01-15 DIAGNOSIS — K219 Gastro-esophageal reflux disease without esophagitis: Secondary | ICD-10-CM | POA: Diagnosis not present

## 2021-01-15 DIAGNOSIS — L309 Dermatitis, unspecified: Secondary | ICD-10-CM | POA: Diagnosis not present

## 2021-01-15 DIAGNOSIS — R3129 Other microscopic hematuria: Secondary | ICD-10-CM | POA: Diagnosis not present

## 2021-01-15 DIAGNOSIS — M25511 Pain in right shoulder: Secondary | ICD-10-CM

## 2021-01-16 DIAGNOSIS — M1612 Unilateral primary osteoarthritis, left hip: Secondary | ICD-10-CM | POA: Diagnosis not present

## 2021-01-16 DIAGNOSIS — Z96641 Presence of right artificial hip joint: Secondary | ICD-10-CM | POA: Diagnosis not present

## 2021-01-16 DIAGNOSIS — M25551 Pain in right hip: Secondary | ICD-10-CM | POA: Diagnosis not present

## 2021-01-20 ENCOUNTER — Ambulatory Visit
Admission: RE | Admit: 2021-01-20 | Discharge: 2021-01-20 | Disposition: A | Discharge status: Home / Self Care | Payer: Medicare Other | Source: Ambulatory Visit | Attending: Internal Medicine | Admitting: Internal Medicine

## 2021-01-20 ENCOUNTER — Other Ambulatory Visit: Payer: Self-pay

## 2021-01-20 DIAGNOSIS — R1310 Dysphagia, unspecified: Secondary | ICD-10-CM | POA: Insufficient documentation

## 2021-01-20 IMAGING — RF DG ESOPHAGUS
8 of 9 series · 14 of 24 positions shown · non-contrast
Comparison: None.

CLINICAL DATA: Dysphasia

EXAM:
ESOPHOGRAM / BARIUM SWALLOW / BARIUM TABLET STUDY
TECHNIQUE: Combined double contrast and single contrast examination performed
using effervescent crystals, thick barium liquid, and thin barium
liquid. The patient was observed with fluoroscopy swallowing a 13 mm
barium sulphate tablet.
FLUOROSCOPY TIME:  Fluoroscopy Time:  3.8 minute
Radiation Exposure Index (if provided by the fluoroscopic device):
32.6
Number of Acquired Spot Images: 0

[Series 1: cp_standard · 0.27mm/px · 2 of 93 frames shown (1 of 8)]
[frame 9/93]
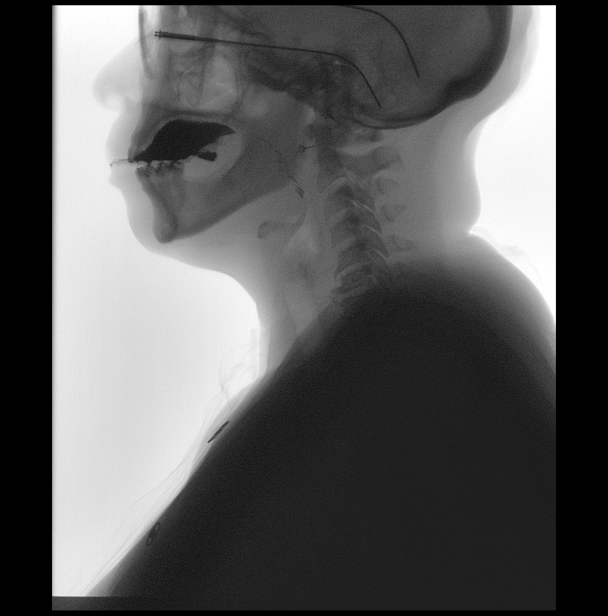
[frame 47/93]
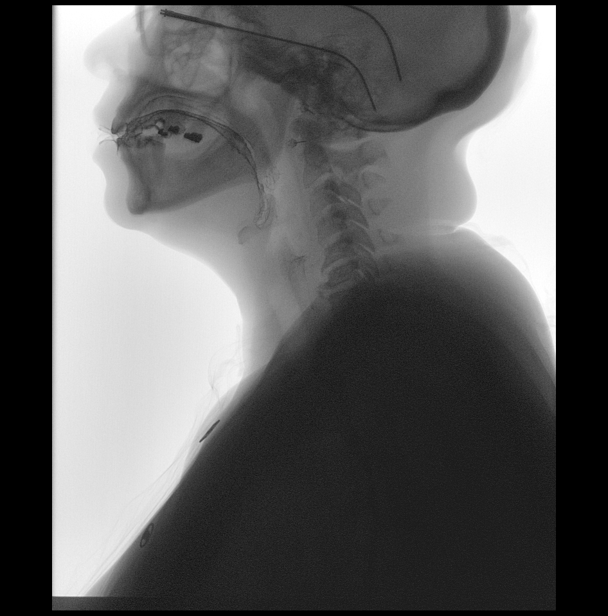

[Series 2: cp_standard · 0.27mm/px · 2 of 106 frames shown (2 of 8)]
[frame 54/106]
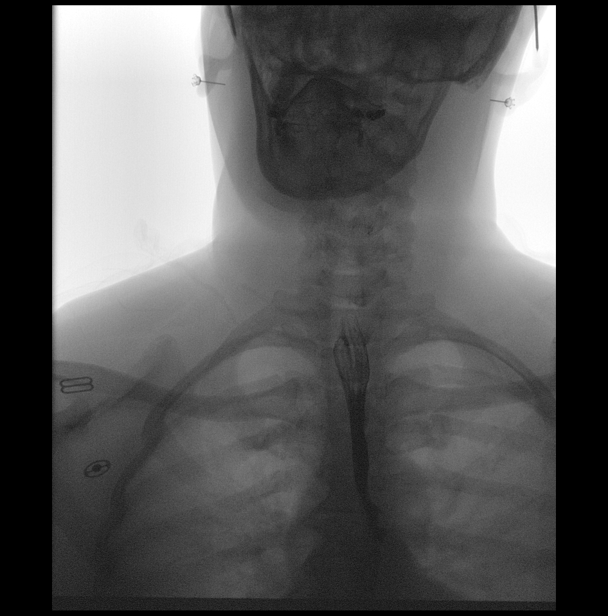
[frame 93/106]
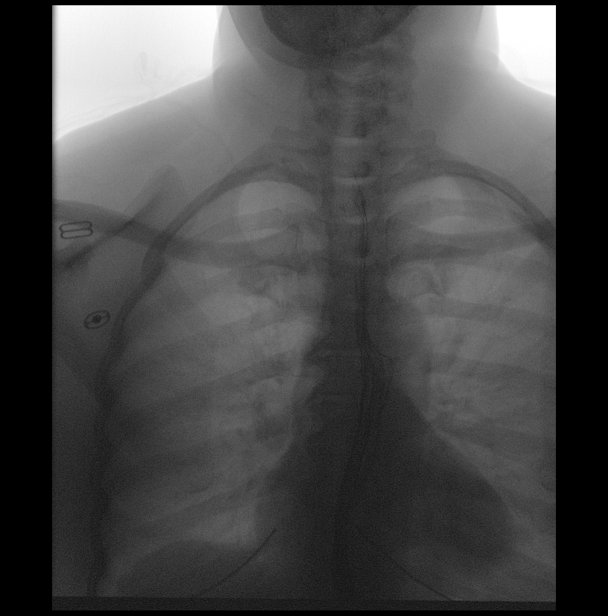

[Series 3: cp_standard · 0.27mm/px · 2 of 355 frames shown (3 of 8)]
[frame 19/355]
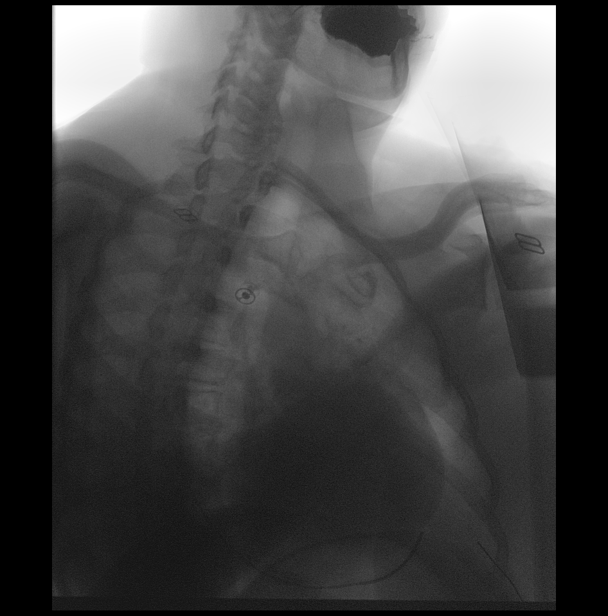
[frame 178/355]
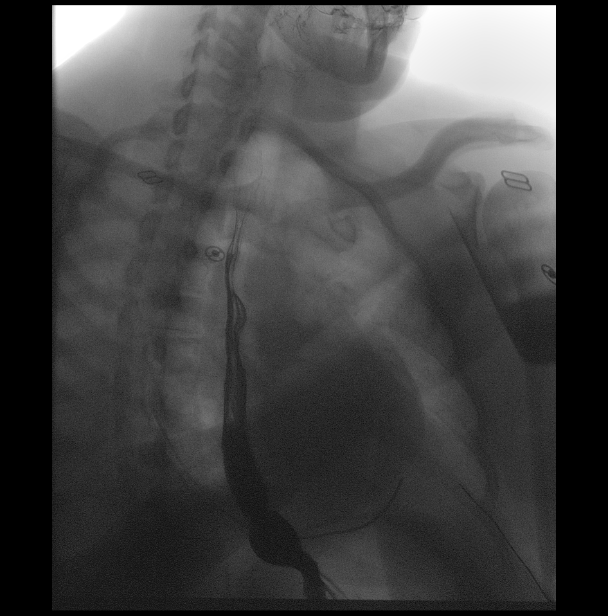

[Series 4: cp_standard · 0.27mm/px · 2 of 108 frames shown (4 of 8)]
[frame 17/108]
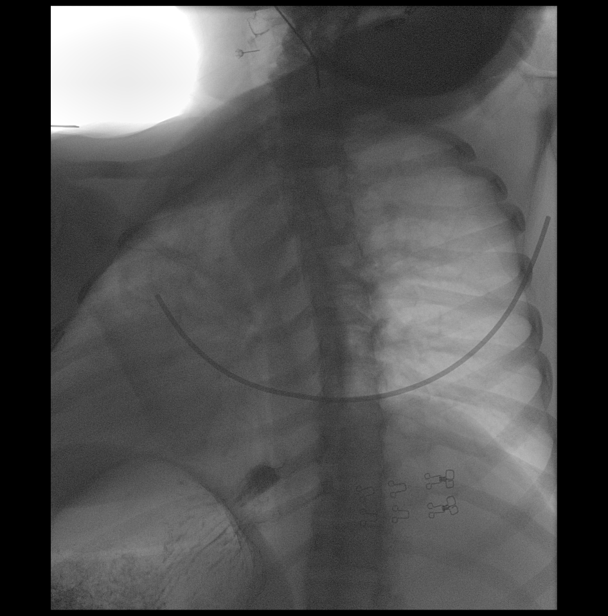
[frame 92/108]
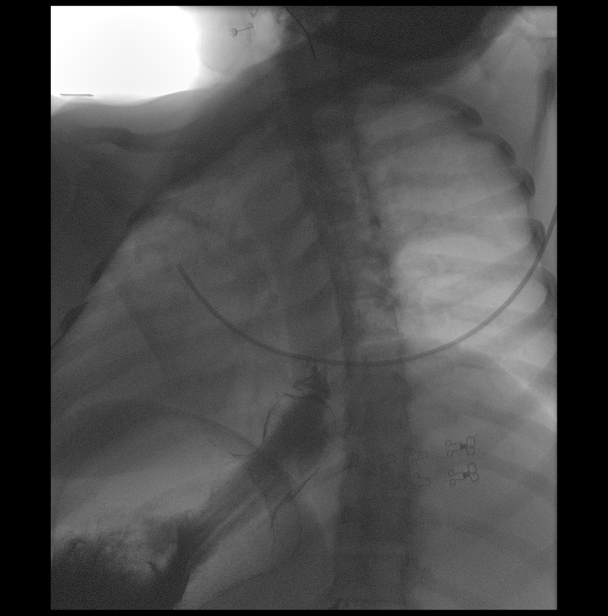

[Series 6: cp_standard · 0.28mm/px · 2 of 237 frames shown (5 of 8)]
[frame 36/237]
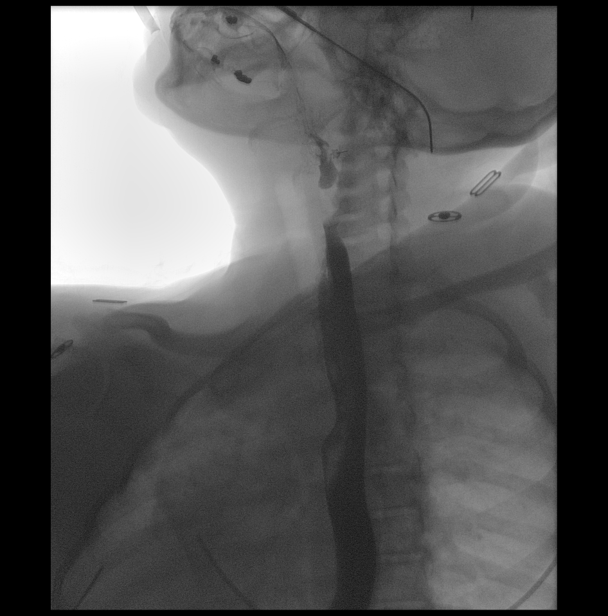
[frame 119/237]
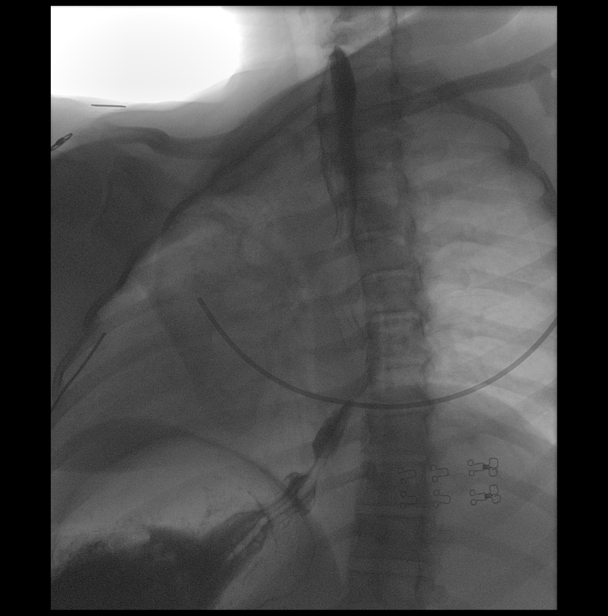

[Series 7: cp_standard · 0.27mm/px · 2 of 36 frames shown (6 of 8)]
[frame 2/36]
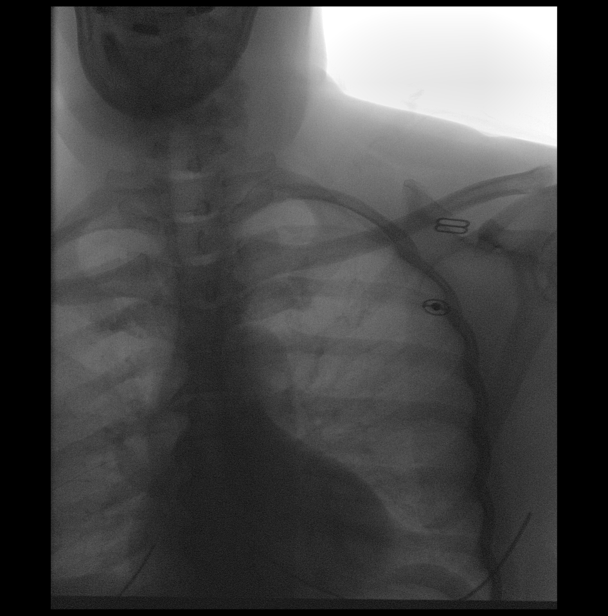
[frame 6/36]
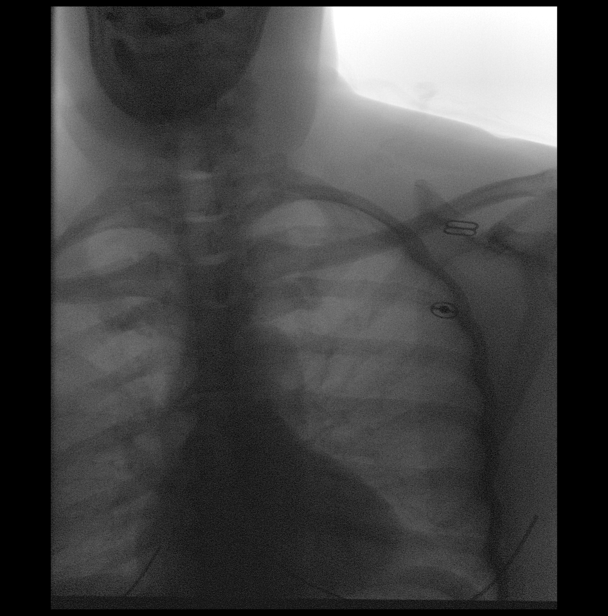

[Series 8: cp_standard · 0.27mm/px · 1 of 1 slices shown (7 of 8)]
[im 1/1]
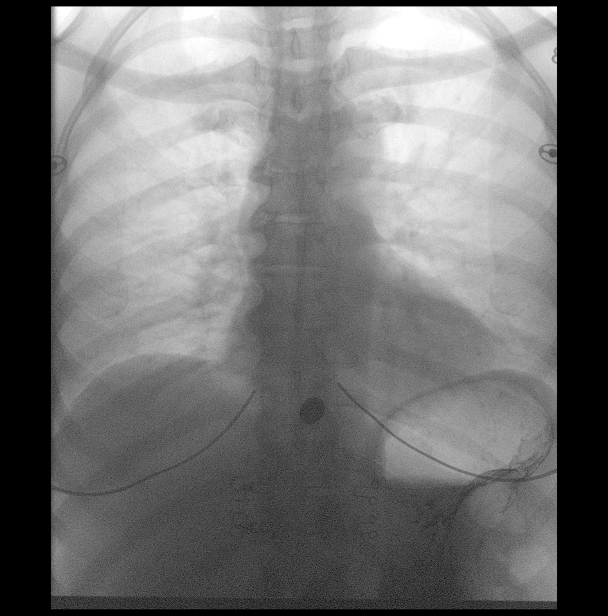

[Series 10: cp_standard · 0.27mm/px · 1 of 1 slices shown (8 of 8)]
[im 1/1]
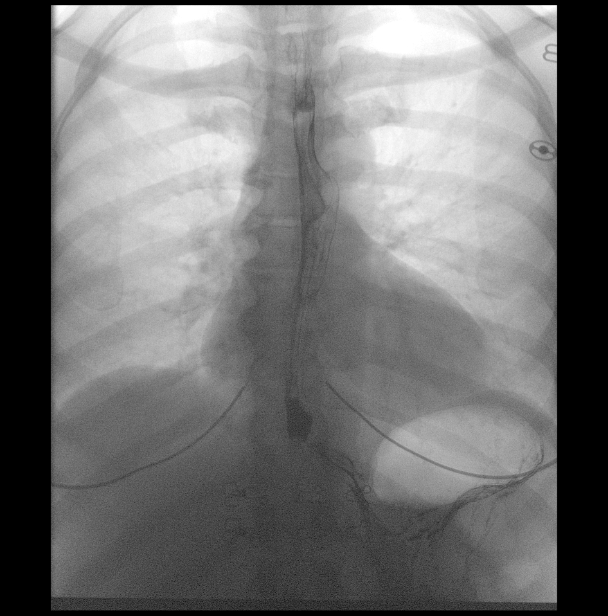

[14 of 24 positions shown; findings below may reference images not displayed]

FINDINGS: Normal pharyngeal anatomy and motility. Persistent lower esophageal
stricture noted throughout the exam. No evidence of mass. Normal
esophageal mucosa without evidence of irregularity or ulceration.
Esophageal motility was normal. No evidence of reflux. No definite
hiatal hernia was demonstrated.

At the end of the examination a 13 mm barium tablet was
administered. Transit was prohibited at the site of a persistent
stricture.
IMPRESSION: 1. Focal distal esophageal stricture restricting the passage of 13
mm barium tablet.

## 2021-01-26 ENCOUNTER — Other Ambulatory Visit: Payer: Self-pay

## 2021-01-26 ENCOUNTER — Ambulatory Visit
Admission: RE | Admit: 2021-01-26 | Discharge: 2021-01-26 | Disposition: A | Discharge status: Home / Self Care | Payer: Medicare Other | Source: Ambulatory Visit | Attending: Emergency Medicine | Admitting: Emergency Medicine

## 2021-01-26 DIAGNOSIS — M545 Low back pain, unspecified: Secondary | ICD-10-CM | POA: Insufficient documentation

## 2021-01-26 DIAGNOSIS — M50322 Other cervical disc degeneration at C5-C6 level: Secondary | ICD-10-CM | POA: Diagnosis not present

## 2021-01-26 DIAGNOSIS — M542 Cervicalgia: Secondary | ICD-10-CM | POA: Diagnosis not present

## 2021-01-26 DIAGNOSIS — M47816 Spondylosis without myelopathy or radiculopathy, lumbar region: Secondary | ICD-10-CM | POA: Diagnosis not present

## 2021-01-26 DIAGNOSIS — S46011A Strain of muscle(s) and tendon(s) of the rotator cuff of right shoulder, initial encounter: Secondary | ICD-10-CM | POA: Diagnosis not present

## 2021-01-26 DIAGNOSIS — M25511 Pain in right shoulder: Secondary | ICD-10-CM | POA: Diagnosis not present

## 2021-01-26 DIAGNOSIS — M50321 Other cervical disc degeneration at C4-C5 level: Secondary | ICD-10-CM | POA: Diagnosis not present
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
## 2021-01-29 ENCOUNTER — Ambulatory Visit: Payer: Medicare Other | Attending: Emergency Medicine

## 2021-02-03 ENCOUNTER — Ambulatory Visit: Payer: Medicare Other

## 2021-02-25 DIAGNOSIS — M169 Osteoarthritis of hip, unspecified: Secondary | ICD-10-CM | POA: Diagnosis not present

## 2021-02-25 DIAGNOSIS — E119 Type 2 diabetes mellitus without complications: Secondary | ICD-10-CM | POA: Diagnosis not present

## 2021-02-25 DIAGNOSIS — L309 Dermatitis, unspecified: Secondary | ICD-10-CM | POA: Diagnosis not present

## 2021-02-25 DIAGNOSIS — K222 Esophageal obstruction: Secondary | ICD-10-CM | POA: Diagnosis not present

## 2021-02-25 DIAGNOSIS — K219 Gastro-esophageal reflux disease without esophagitis: Secondary | ICD-10-CM | POA: Diagnosis not present

## 2021-02-25 DIAGNOSIS — R3129 Other microscopic hematuria: Secondary | ICD-10-CM | POA: Diagnosis not present

## 2021-02-25 DIAGNOSIS — S46911A Strain of unspecified muscle, fascia and tendon at shoulder and upper arm level, right arm, initial encounter: Secondary | ICD-10-CM | POA: Diagnosis not present

## 2021-02-25 DIAGNOSIS — R1310 Dysphagia, unspecified: Secondary | ICD-10-CM | POA: Diagnosis not present

## 2021-03-25 DIAGNOSIS — I10 Essential (primary) hypertension: Secondary | ICD-10-CM | POA: Diagnosis not present

## 2021-03-25 DIAGNOSIS — E119 Type 2 diabetes mellitus without complications: Secondary | ICD-10-CM | POA: Diagnosis not present

## 2021-04-07 DIAGNOSIS — K219 Gastro-esophageal reflux disease without esophagitis: Secondary | ICD-10-CM | POA: Diagnosis not present

## 2021-04-07 DIAGNOSIS — I10 Essential (primary) hypertension: Secondary | ICD-10-CM | POA: Diagnosis not present

## 2021-04-07 DIAGNOSIS — J019 Acute sinusitis, unspecified: Secondary | ICD-10-CM | POA: Diagnosis not present

## 2021-04-07 DIAGNOSIS — L309 Dermatitis, unspecified: Secondary | ICD-10-CM | POA: Diagnosis not present

## 2021-04-07 DIAGNOSIS — M5136 Other intervertebral disc degeneration, lumbar region: Secondary | ICD-10-CM | POA: Diagnosis not present

## 2021-04-07 DIAGNOSIS — R1310 Dysphagia, unspecified: Secondary | ICD-10-CM | POA: Diagnosis not present

## 2021-04-07 DIAGNOSIS — R3129 Other microscopic hematuria: Secondary | ICD-10-CM | POA: Diagnosis not present

## 2021-04-07 DIAGNOSIS — M169 Osteoarthritis of hip, unspecified: Secondary | ICD-10-CM | POA: Diagnosis not present

## 2021-04-11 ENCOUNTER — Other Ambulatory Visit: Payer: Self-pay | Admitting: Internal Medicine

## 2021-04-11 DIAGNOSIS — Z1231 Encounter for screening mammogram for malignant neoplasm of breast: Secondary | ICD-10-CM

## 2021-04-18 DIAGNOSIS — M9903 Segmental and somatic dysfunction of lumbar region: Secondary | ICD-10-CM | POA: Diagnosis not present

## 2021-04-18 DIAGNOSIS — M955 Acquired deformity of pelvis: Secondary | ICD-10-CM | POA: Diagnosis not present

## 2021-04-18 DIAGNOSIS — M5416 Radiculopathy, lumbar region: Secondary | ICD-10-CM | POA: Diagnosis not present

## 2021-04-18 DIAGNOSIS — M9905 Segmental and somatic dysfunction of pelvic region: Secondary | ICD-10-CM | POA: Diagnosis not present

## 2021-04-22 DIAGNOSIS — Z7901 Long term (current) use of anticoagulants: Secondary | ICD-10-CM | POA: Diagnosis not present

## 2021-04-22 DIAGNOSIS — Z01810 Encounter for preprocedural cardiovascular examination: Secondary | ICD-10-CM | POA: Diagnosis not present

## 2021-04-22 DIAGNOSIS — Z01812 Encounter for preprocedural laboratory examination: Secondary | ICD-10-CM | POA: Diagnosis not present

## 2021-04-22 DIAGNOSIS — M1612 Unilateral primary osteoarthritis, left hip: Secondary | ICD-10-CM | POA: Diagnosis not present

## 2021-04-22 DIAGNOSIS — R7309 Other abnormal glucose: Secondary | ICD-10-CM | POA: Diagnosis not present

## 2021-04-22 DIAGNOSIS — Z5181 Encounter for therapeutic drug level monitoring: Secondary | ICD-10-CM | POA: Diagnosis not present

## 2021-04-22 DIAGNOSIS — Z01818 Encounter for other preprocedural examination: Secondary | ICD-10-CM | POA: Diagnosis not present

## 2021-04-28 DIAGNOSIS — E119 Type 2 diabetes mellitus without complications: Secondary | ICD-10-CM | POA: Diagnosis not present

## 2021-04-28 DIAGNOSIS — Z1211 Encounter for screening for malignant neoplasm of colon: Secondary | ICD-10-CM | POA: Diagnosis not present

## 2021-04-28 DIAGNOSIS — Z1212 Encounter for screening for malignant neoplasm of rectum: Secondary | ICD-10-CM | POA: Diagnosis not present

## 2021-05-09 DIAGNOSIS — I10 Essential (primary) hypertension: Secondary | ICD-10-CM | POA: Diagnosis not present

## 2021-05-09 DIAGNOSIS — Z01812 Encounter for preprocedural laboratory examination: Secondary | ICD-10-CM | POA: Diagnosis not present

## 2021-05-09 DIAGNOSIS — Z791 Long term (current) use of non-steroidal anti-inflammatories (NSAID): Secondary | ICD-10-CM | POA: Diagnosis not present

## 2021-05-09 DIAGNOSIS — E119 Type 2 diabetes mellitus without complications: Secondary | ICD-10-CM | POA: Diagnosis not present

## 2021-05-09 DIAGNOSIS — M419 Scoliosis, unspecified: Secondary | ICD-10-CM | POA: Diagnosis not present

## 2021-05-09 DIAGNOSIS — J4520 Mild intermittent asthma, uncomplicated: Secondary | ICD-10-CM | POA: Diagnosis not present

## 2021-05-09 DIAGNOSIS — Z471 Aftercare following joint replacement surgery: Secondary | ICD-10-CM | POA: Diagnosis not present

## 2021-05-09 DIAGNOSIS — Z7951 Long term (current) use of inhaled steroids: Secondary | ICD-10-CM | POA: Diagnosis not present

## 2021-05-09 DIAGNOSIS — Z466 Encounter for fitting and adjustment of urinary device: Secondary | ICD-10-CM | POA: Diagnosis not present

## 2021-05-09 DIAGNOSIS — K219 Gastro-esophageal reflux disease without esophagitis: Secondary | ICD-10-CM | POA: Diagnosis not present

## 2021-05-09 DIAGNOSIS — M1612 Unilateral primary osteoarthritis, left hip: Secondary | ICD-10-CM | POA: Diagnosis not present

## 2021-05-09 DIAGNOSIS — M5136 Other intervertebral disc degeneration, lumbar region: Secondary | ICD-10-CM | POA: Diagnosis not present

## 2021-05-09 DIAGNOSIS — Z7984 Long term (current) use of oral hypoglycemic drugs: Secondary | ICD-10-CM | POA: Diagnosis not present

## 2021-05-09 DIAGNOSIS — Z7982 Long term (current) use of aspirin: Secondary | ICD-10-CM | POA: Diagnosis not present

## 2021-05-09 DIAGNOSIS — F32A Depression, unspecified: Secondary | ICD-10-CM | POA: Diagnosis not present

## 2021-05-09 DIAGNOSIS — Z20822 Contact with and (suspected) exposure to covid-19: Secondary | ICD-10-CM | POA: Diagnosis not present

## 2021-05-09 DIAGNOSIS — J45909 Unspecified asthma, uncomplicated: Secondary | ICD-10-CM | POA: Diagnosis not present

## 2021-05-09 DIAGNOSIS — Z96642 Presence of left artificial hip joint: Secondary | ICD-10-CM | POA: Diagnosis not present

## 2021-05-21 DIAGNOSIS — E119 Type 2 diabetes mellitus without complications: Secondary | ICD-10-CM | POA: Diagnosis not present

## 2021-05-26 DIAGNOSIS — K219 Gastro-esophageal reflux disease without esophagitis: Secondary | ICD-10-CM | POA: Diagnosis not present

## 2021-05-26 DIAGNOSIS — R1310 Dysphagia, unspecified: Secondary | ICD-10-CM | POA: Diagnosis not present

## 2021-05-26 DIAGNOSIS — M5136 Other intervertebral disc degeneration, lumbar region: Secondary | ICD-10-CM | POA: Diagnosis not present

## 2021-05-26 DIAGNOSIS — R3129 Other microscopic hematuria: Secondary | ICD-10-CM | POA: Diagnosis not present

## 2021-05-26 DIAGNOSIS — E119 Type 2 diabetes mellitus without complications: Secondary | ICD-10-CM | POA: Diagnosis not present

## 2021-05-26 DIAGNOSIS — L309 Dermatitis, unspecified: Secondary | ICD-10-CM | POA: Diagnosis not present

## 2021-05-27 DIAGNOSIS — R7303 Prediabetes: Secondary | ICD-10-CM | POA: Diagnosis not present

## 2021-05-27 DIAGNOSIS — Z79891 Long term (current) use of opiate analgesic: Secondary | ICD-10-CM | POA: Diagnosis not present

## 2021-05-27 DIAGNOSIS — Z7984 Long term (current) use of oral hypoglycemic drugs: Secondary | ICD-10-CM | POA: Diagnosis not present

## 2021-05-27 DIAGNOSIS — I10 Essential (primary) hypertension: Secondary | ICD-10-CM | POA: Diagnosis not present

## 2021-05-27 DIAGNOSIS — J4520 Mild intermittent asthma, uncomplicated: Secondary | ICD-10-CM | POA: Diagnosis not present

## 2021-05-27 DIAGNOSIS — Z96641 Presence of right artificial hip joint: Secondary | ICD-10-CM | POA: Diagnosis not present

## 2021-05-27 DIAGNOSIS — Z471 Aftercare following joint replacement surgery: Secondary | ICD-10-CM | POA: Diagnosis not present

## 2021-05-27 DIAGNOSIS — M5136 Other intervertebral disc degeneration, lumbar region: Secondary | ICD-10-CM | POA: Diagnosis not present

## 2021-05-27 DIAGNOSIS — Z96642 Presence of left artificial hip joint: Secondary | ICD-10-CM | POA: Diagnosis not present

## 2021-05-27 DIAGNOSIS — F32A Depression, unspecified: Secondary | ICD-10-CM | POA: Diagnosis not present

## 2021-05-27 DIAGNOSIS — M419 Scoliosis, unspecified: Secondary | ICD-10-CM | POA: Diagnosis not present

## 2021-06-03 DIAGNOSIS — J4520 Mild intermittent asthma, uncomplicated: Secondary | ICD-10-CM | POA: Diagnosis not present

## 2021-06-03 DIAGNOSIS — Z7984 Long term (current) use of oral hypoglycemic drugs: Secondary | ICD-10-CM | POA: Diagnosis not present

## 2021-06-03 DIAGNOSIS — M419 Scoliosis, unspecified: Secondary | ICD-10-CM | POA: Diagnosis not present

## 2021-06-03 DIAGNOSIS — Z79891 Long term (current) use of opiate analgesic: Secondary | ICD-10-CM | POA: Diagnosis not present

## 2021-06-03 DIAGNOSIS — Z471 Aftercare following joint replacement surgery: Secondary | ICD-10-CM | POA: Diagnosis not present

## 2021-06-03 DIAGNOSIS — R7303 Prediabetes: Secondary | ICD-10-CM | POA: Diagnosis not present

## 2021-06-03 DIAGNOSIS — I10 Essential (primary) hypertension: Secondary | ICD-10-CM | POA: Diagnosis not present

## 2021-06-03 DIAGNOSIS — M5136 Other intervertebral disc degeneration, lumbar region: Secondary | ICD-10-CM | POA: Diagnosis not present

## 2021-06-03 DIAGNOSIS — Z96642 Presence of left artificial hip joint: Secondary | ICD-10-CM | POA: Diagnosis not present

## 2021-06-03 DIAGNOSIS — Z96641 Presence of right artificial hip joint: Secondary | ICD-10-CM | POA: Diagnosis not present

## 2021-06-03 DIAGNOSIS — F32A Depression, unspecified: Secondary | ICD-10-CM | POA: Diagnosis not present

## 2021-06-04 DIAGNOSIS — R7303 Prediabetes: Secondary | ICD-10-CM | POA: Diagnosis not present

## 2021-06-04 DIAGNOSIS — M419 Scoliosis, unspecified: Secondary | ICD-10-CM | POA: Diagnosis not present

## 2021-06-04 DIAGNOSIS — I10 Essential (primary) hypertension: Secondary | ICD-10-CM | POA: Diagnosis not present

## 2021-06-04 DIAGNOSIS — J4520 Mild intermittent asthma, uncomplicated: Secondary | ICD-10-CM | POA: Diagnosis not present

## 2021-06-04 DIAGNOSIS — Z79891 Long term (current) use of opiate analgesic: Secondary | ICD-10-CM | POA: Diagnosis not present

## 2021-06-04 DIAGNOSIS — F32A Depression, unspecified: Secondary | ICD-10-CM | POA: Diagnosis not present

## 2021-06-04 DIAGNOSIS — Z471 Aftercare following joint replacement surgery: Secondary | ICD-10-CM | POA: Diagnosis not present

## 2021-06-04 DIAGNOSIS — Z7984 Long term (current) use of oral hypoglycemic drugs: Secondary | ICD-10-CM | POA: Diagnosis not present

## 2021-06-04 DIAGNOSIS — Z96641 Presence of right artificial hip joint: Secondary | ICD-10-CM | POA: Diagnosis not present

## 2021-06-04 DIAGNOSIS — Z96642 Presence of left artificial hip joint: Secondary | ICD-10-CM | POA: Diagnosis not present

## 2021-06-04 DIAGNOSIS — M5136 Other intervertebral disc degeneration, lumbar region: Secondary | ICD-10-CM | POA: Diagnosis not present

## 2021-06-17 DIAGNOSIS — K219 Gastro-esophageal reflux disease without esophagitis: Secondary | ICD-10-CM | POA: Diagnosis not present

## 2021-06-17 DIAGNOSIS — R1310 Dysphagia, unspecified: Secondary | ICD-10-CM | POA: Diagnosis not present

## 2021-06-17 DIAGNOSIS — Z1211 Encounter for screening for malignant neoplasm of colon: Secondary | ICD-10-CM | POA: Diagnosis not present

## 2021-06-26 DIAGNOSIS — Z471 Aftercare following joint replacement surgery: Secondary | ICD-10-CM | POA: Diagnosis not present

## 2021-06-26 DIAGNOSIS — Z96642 Presence of left artificial hip joint: Secondary | ICD-10-CM | POA: Diagnosis not present

## 2021-06-26 DIAGNOSIS — Z09 Encounter for follow-up examination after completed treatment for conditions other than malignant neoplasm: Secondary | ICD-10-CM | POA: Diagnosis not present

## 2021-07-17 DIAGNOSIS — J069 Acute upper respiratory infection, unspecified: Secondary | ICD-10-CM | POA: Diagnosis not present

## 2021-07-17 DIAGNOSIS — Z20822 Contact with and (suspected) exposure to covid-19: Secondary | ICD-10-CM | POA: Diagnosis not present

## 2021-08-27 ENCOUNTER — Emergency Department: Payer: Medicare Other

## 2021-08-27 ENCOUNTER — Other Ambulatory Visit: Payer: Self-pay

## 2021-08-27 ENCOUNTER — Encounter: Payer: Self-pay | Admitting: Emergency Medicine

## 2021-08-27 ENCOUNTER — Emergency Department
Admission: EM | Admit: 2021-08-27 | Discharge: 2021-08-27 | Disposition: A | Discharge status: Home / Self Care | Payer: Medicare Other | Attending: Emergency Medicine | Admitting: Emergency Medicine

## 2021-08-27 DIAGNOSIS — J40 Bronchitis, not specified as acute or chronic: Secondary | ICD-10-CM | POA: Diagnosis not present

## 2021-08-27 DIAGNOSIS — Z7984 Long term (current) use of oral hypoglycemic drugs: Secondary | ICD-10-CM | POA: Diagnosis not present

## 2021-08-27 DIAGNOSIS — I10 Essential (primary) hypertension: Secondary | ICD-10-CM | POA: Insufficient documentation

## 2021-08-27 DIAGNOSIS — E119 Type 2 diabetes mellitus without complications: Secondary | ICD-10-CM | POA: Insufficient documentation

## 2021-08-27 DIAGNOSIS — R059 Cough, unspecified: Secondary | ICD-10-CM | POA: Diagnosis not present

## 2021-08-27 DIAGNOSIS — Z79899 Other long term (current) drug therapy: Secondary | ICD-10-CM | POA: Insufficient documentation

## 2021-08-27 DIAGNOSIS — Z7952 Long term (current) use of systemic steroids: Secondary | ICD-10-CM | POA: Diagnosis not present

## 2021-08-27 DIAGNOSIS — Z20822 Contact with and (suspected) exposure to covid-19: Secondary | ICD-10-CM | POA: Insufficient documentation

## 2021-08-27 LAB — RESP PANEL BY RT-PCR (FLU A&B, COVID) ARPGX2
Influenza A by PCR: NEGATIVE
Influenza B by PCR: NEGATIVE
SARS Coronavirus 2 by RT PCR: NEGATIVE

## 2021-08-27 IMAGING — CR DG CHEST 2V
2 series · 2 of 2 positions shown · non-contrast
Comparison: [DATE].

CLINICAL DATA: Cough.

EXAM:
CHEST - 2 VIEW

[chest pa]
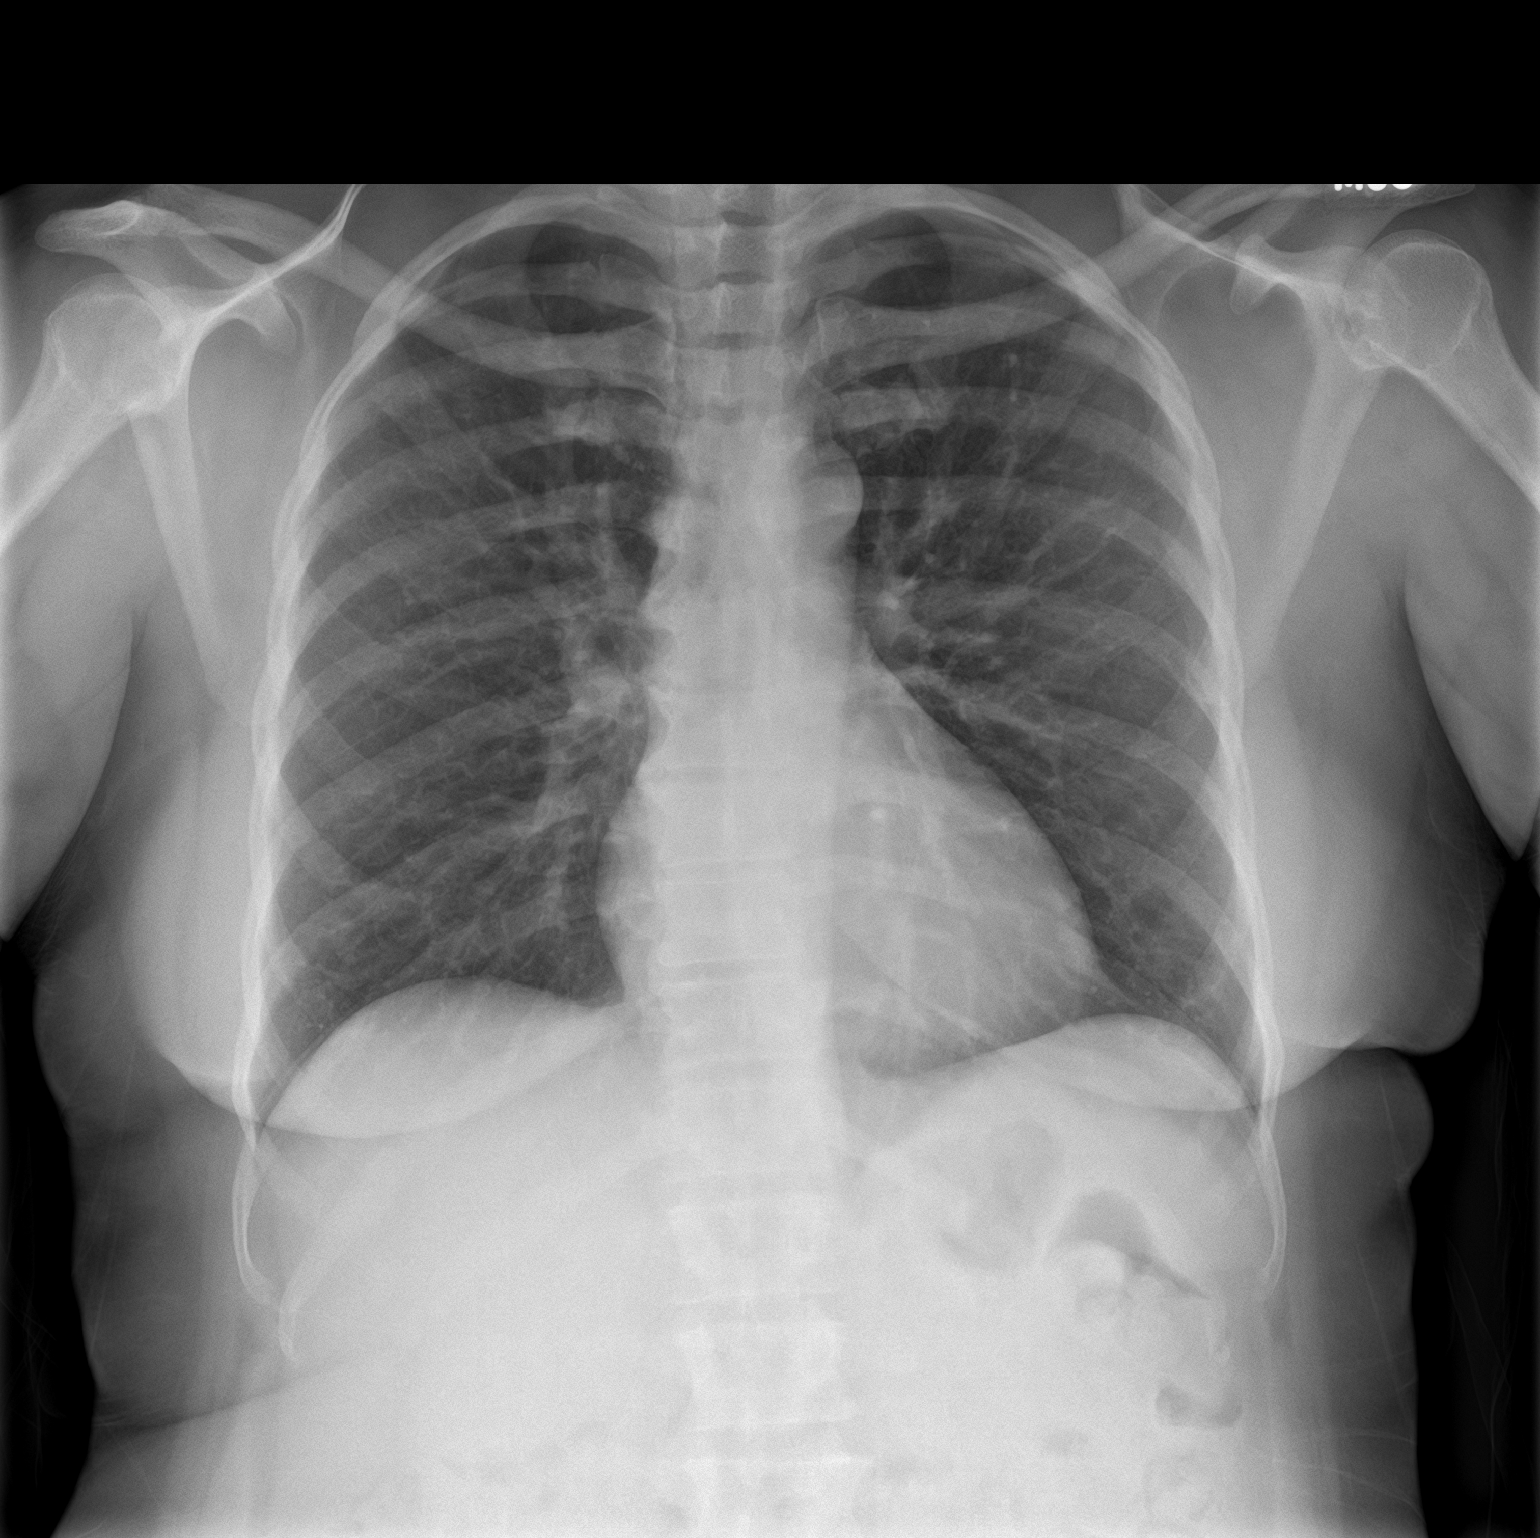

[chest lat]
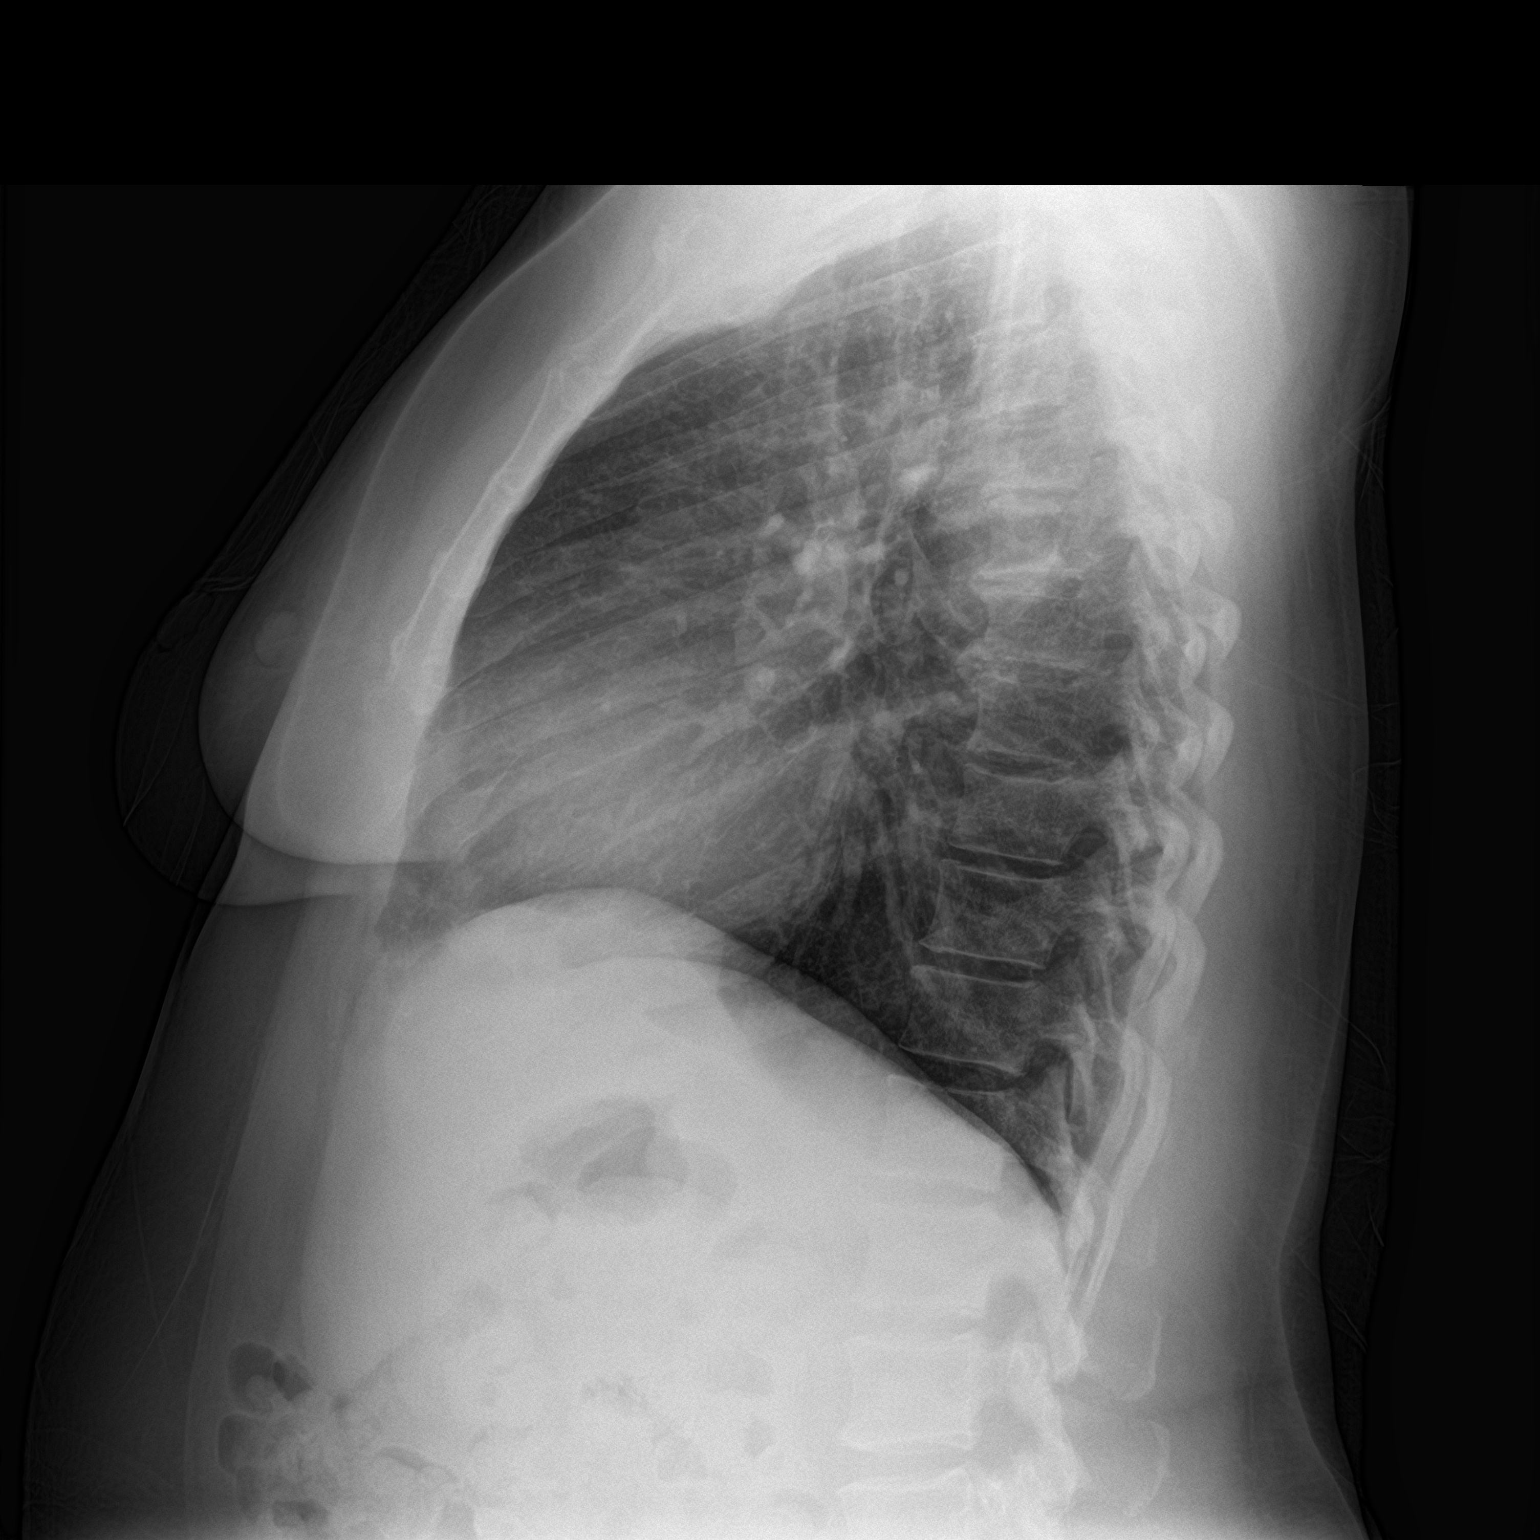

[2 of 2 positions shown; findings below may reference images not displayed]

FINDINGS: The heart size and mediastinal contours are within normal limits.
Both lungs are clear. The visualized skeletal structures are
unremarkable.
IMPRESSION: No active cardiopulmonary disease.

## 2021-08-27 MED ORDER — GUAIFENESIN-CODEINE 100-10 MG/5ML PO SOLN: 5.0000 mL | Freq: Four times a day (QID) | ORAL | 0 refills | Status: DC | PRN

## 2021-08-27 NOTE — ED Triage Notes (Signed)
Presents with cough and cold sxs' for about 1 month

## 2021-08-27 NOTE — ED Provider Notes (Signed)
[facility] Emergency Department Provider Note   ____________________________________________   Event Date/Time   First MD Initiated Contact with Patient 08/27/21 [phone]     (approximate)  I have reviewed the triage vital signs and the nursing notes.   HISTORY  Chief Complaint URI    HPI Samantha Deleon is a 55 y.o. female presents to the ED with complaint of nonproductive cough for 1 month.  Patient states that she has been seen by her PCP and was prescribed Tessalon Perles and a prednisone Dosepak.  Patient denies any fever, chills, vomiting or diarrhea.  She has had 1 episode of nausea.  Patient has a history of asthma and does use an albuterol inhaler and Advair.  She denies smoking.         Past Medical History:  Diagnosis Date   Asthma    Diabetes mellitus without complication ([facility])    Hypertension     Patient Active Problem List   Diagnosis Date Noted   Hypertension 06/16/2018   Diabetes mellitus without complication ([place]) 11/57/2620   Acute nasopharyngitis 06/16/2018    Past Surgical History:  Procedure Laterality Date   TUBAL LIGATION      Prior to Admission medications   Medication Sig Start Date End Date Taking? Authorizing Provider  albuterol (PROVENTIL HFA;VENTOLIN HFA) 108 (90 Base) MCG/ACT inhaler Inhale 2 puffs into the lungs every 6 (six) hours as needed for wheezing or shortness of breath. 06/28/18   Zara Council A, PA-C  amLODipine (NORVASC) 10 MG tablet Take 10 mg by mouth daily.    [provider]  busPIRone (BUSPAR) 10 MG tablet Take 10 mg by mouth 3 (three) times daily.    [provider]  DULoxetine (CYMBALTA) 20 MG capsule Take 20 mg by mouth daily.    [provider]  Fluticasone-Salmeterol (ADVAIR DISKUS) 100-50 MCG/DOSE AEPB Inhale 1 puff into the lungs 2 (two) times daily. 06/28/18   McGowan, Larene Beach A, PA-C  guaiFENesin-codeine 100-10 MG/5ML syrup Take 5 mLs by mouth every 6 (six)  hours as needed. 08/27/21   Johnn Hai, PA-C  lisinopril (PRINIVIL,ZESTRIL) 10 MG tablet Take 10 mg by mouth daily.    [provider]  loratadine (CLARITIN) 10 MG tablet Take 10 mg by mouth daily.    [provider]  metFORMIN (GLUCOPHAGE) 500 MG tablet Take by mouth 2 (two) times daily with a meal.    [provider]  mirtazapine (REMERON) 7.5 MG tablet Take 7.5 mg by mouth at bedtime.    [provider]  prazosin (MINIPRESS) 1 MG capsule Take 1 mg by mouth at bedtime.    [provider]    Allergies Seasonal ic [cholestatin]  No family history on file.  Social History Social History   Tobacco Use   Smoking status: Never   Smokeless tobacco: Never  Vaping Use   Vaping Use: Never used  Substance Use Topics   Alcohol use: No   Drug use: No    Review of Systems Constitutional: No fever/chills Eyes: No visual changes. ENT: No sore throat. Cardiovascular: Denies chest pain. Respiratory: Denies shortness of breath.  Positive for cough. Gastrointestinal: No abdominal pain.  No nausea, no vomiting.  No diarrhea.   Genitourinary: Negative for dysuria. Musculoskeletal: Negative for muscle skeletal pain. Skin: Negative for rash. Neurological: Negative for headaches, focal weakness or numbness.  ____________________________________________   PHYSICAL EXAM:  VITAL SIGNS: ED Triage Vitals  Enc Vitals Group  BP 08/27/21 0750 139/76     Pulse Rate 08/27/21 0750 74     Resp 08/27/21 0750 18     Temp 08/27/21 0750 98.1 F (36.7 C)     Temp Source 08/27/21 0750 Oral     SpO2 08/27/21 0750 94 %     Weight 08/27/21 0747 171 lb 1.2 oz (77.6 kg)     Height 08/27/21 0747 5\' 2"  (1.575 m)     Head Circumference --      Peak Flow --      Pain Score --      Pain Loc --      Pain Edu? --      Excl. in [place]? --     Constitutional: Alert and oriented. Well appearing and in no acute distress. Eyes: Conjunctivae are normal. PERRL.  EOMI. Head: Atraumatic. Nose: No congestion/rhinnorhea. Mouth/Throat: Mucous membranes are moist.  Oropharynx non-erythematous. Neck: No stridor.   Cardiovascular: Normal rate, regular rhythm. Grossly normal heart sounds.  Good peripheral circulation. Respiratory: Normal respiratory effort.  No retractions. Lungs CTAB. Gastrointestinal: Soft and nontender. No distention.  Musculoskeletal: His upper and lower extremities with any difficulty.  Normal gait was noted. Neurologic:  Normal speech and language. No gross focal neurologic deficits are appreciated.  Skin:  Skin is warm, dry and intact. No rash noted. Psychiatric: Mood and affect are normal. Speech and behavior are normal.  ____________________________________________   LABS (all labs ordered are listed, but only abnormal results are displayed)  Labs Reviewed  RESP PANEL BY RT-PCR (FLU A&B, COVID) [id number]   ____________________________________________ _  RADIOLOGY I, Johnn Hai, personally viewed and evaluated these images (plain radiographs) as part of my medical decision making, as well as reviewing the written report by the radiologist.    Official radiology report(s): DG Chest 2 View  Result Date: 08/27/2021 CLINICAL DATA:  Cough. EXAM: CHEST - 2 VIEW COMPARISON:  November 15, 2020. FINDINGS: The heart size and mediastinal contours are within normal limits. Both lungs are clear. The visualized skeletal structures are unremarkable. IMPRESSION: No active cardiopulmonary disease. Electronically Signed   By: Marijo Conception M.D.   On: 08/27/2021 10:13    ____________________________________________   PROCEDURES  Procedure(s) performed (including Critical Care):  Procedures   ____________________________________________   INITIAL IMPRESSION / ASSESSMENT AND PLAN / ED COURSE  As part of my medical decision making, I reviewed the following data within the electronic MEDICAL RECORD NUMBER Notes from prior ED visits  and  Controlled Substance Database  55 year old female presents to the ED with complaint of continued cough.  Patient has a history of asthma and currently continues using her albuterol and Advair.  She has seen her PCP and was prescribed Tessalon Perles and a prednisone taper.  Respiratory panel was negative for COVID and influenza.  Chest x-ray was negative for any acute cardiopulmonary disease.  Patient was reassured.  She is to continue with her routine asthma medications and a prescription for Robitussin-AC was sent to the pharmacy to take every 6 hours as needed for cough.  She is to follow-up with her PCP if any continued problems or not improving.  She is return to the emergency department if any severe worsening of her symptoms such as difficulty breathing or shortness of breath.  ____________________________________________   FINAL CLINICAL IMPRESSION(S) / ED DIAGNOSES  Final diagnoses:  Bronchitis     ED Discharge Orders          Ordered    guaiFENesin-codeine  100-10 MG/5ML syrup  Every 6 hours PRN,   Status:  Discontinued        08/27/21 1059    guaiFENesin-codeine 100-10 MG/5ML syrup  Every 6 hours PRN        08/27/21 1100             Note:  This document was prepared using Dragon voice recognition software and may include unintentional dictation errors.    Johnn Hai, PA-C 08/27/21 1609    Lucrezia Starch, MD 08/27/21 [phone]

## 2021-08-27 NOTE — Discharge Instructions (Signed)
Follow-up with your primary care provider if any continued problems or not improving.  A prescription for cough was sent to your pharmacy to take as needed especially at night for you go to bed so that she can get some sleep.  Do not take the cough medication and drive as it does contain a narcotic and could cause drowsiness.  Continue using your albuterol and Advair for your asthma.  Return to the emergency department if any worsening of your symptoms such as shortness of breath or difficulty breathing.

## 2021-09-05 DIAGNOSIS — E119 Type 2 diabetes mellitus without complications: Secondary | ICD-10-CM | POA: Diagnosis not present

## 2021-09-05 DIAGNOSIS — I10 Essential (primary) hypertension: Secondary | ICD-10-CM | POA: Diagnosis not present

## 2021-09-09 DIAGNOSIS — R1310 Dysphagia, unspecified: Secondary | ICD-10-CM | POA: Diagnosis not present

## 2021-09-09 DIAGNOSIS — M5136 Other intervertebral disc degeneration, lumbar region: Secondary | ICD-10-CM | POA: Diagnosis not present

## 2021-09-09 DIAGNOSIS — L309 Dermatitis, unspecified: Secondary | ICD-10-CM | POA: Diagnosis not present

## 2021-09-09 DIAGNOSIS — J019 Acute sinusitis, unspecified: Secondary | ICD-10-CM | POA: Diagnosis not present

## 2021-09-09 DIAGNOSIS — E119 Type 2 diabetes mellitus without complications: Secondary | ICD-10-CM | POA: Diagnosis not present

## 2021-09-09 DIAGNOSIS — K219 Gastro-esophageal reflux disease without esophagitis: Secondary | ICD-10-CM | POA: Diagnosis not present

## 2021-09-09 DIAGNOSIS — R3129 Other microscopic hematuria: Secondary | ICD-10-CM | POA: Diagnosis not present

## 2021-09-23 DIAGNOSIS — J4531 Mild persistent asthma with (acute) exacerbation: Secondary | ICD-10-CM | POA: Diagnosis not present

## 2021-09-23 DIAGNOSIS — R0609 Other forms of dyspnea: Secondary | ICD-10-CM | POA: Diagnosis not present

## 2021-09-23 DIAGNOSIS — J310 Chronic rhinitis: Secondary | ICD-10-CM | POA: Diagnosis not present

## 2021-10-14 DIAGNOSIS — J310 Chronic rhinitis: Secondary | ICD-10-CM | POA: Diagnosis not present

## 2021-10-14 DIAGNOSIS — J4530 Mild persistent asthma, uncomplicated: Secondary | ICD-10-CM | POA: Diagnosis not present

## 2021-11-13 ENCOUNTER — Encounter: Payer: Self-pay | Admitting: Gastroenterology

## 2021-11-13 NOTE — H&P (Incomplete)
? ?Pre-Procedure H&P ?  ?Patient ID: Samantha Deleon is a 56 y.o. female. ? ?Gastroenterology Provider: Annamaria Helling, DO ? ?Referring Provider: Laurine Blazer, PA ?PCP: Jodi Marble, MD ? ?Date: 11/13/2021 ? ?HPI ?[name] is a 56 y.o. female who presents today for Esophagogastroduodenoscopy and Colonoscopy for Dysphagia with barium swallow consistent with esophageal stricture and initial screening colonoscopy. ?Patient undergoing her initial screening colonoscopy.  She notes daily bowel movement without abdominal pain diarrhea constipation melena or hematochezia.  She denies any family history of colorectal cancer or colon polyps. She takes stool softeners as needed ?Patient noted solid food dysphagia for several months.  This was accompanied by epigastric discomfort and burning.  Dysphagia was to solids and without odynophagia.  No liquid or pill dysphagia.  Her weight remained stable as did her appetite.  After starting Protonix the symptoms resolved. ?Previously used ibuprofen daily ?Most recent lab work INR 0.91 creatinine 0.99 hemoglobin 12.2 MCV 84 platelets 257,000 ?Patient is status post bilateral hip replacement ? ?Past Medical History:  ?Diagnosis Date  ? Asthma   ? Hypertension   ? ? ?Past Surgical History:  ?Procedure Laterality Date  ? JOINT REPLACEMENT    ? TUBAL LIGATION    ? ? ?Family History ?No h/o GI disease or malignancy ? ?Review of Systems  ?Constitutional:  Negative for activity change, appetite change, chills, diaphoresis, fatigue, fever and unexpected weight change.  ?HENT:  Positive for trouble swallowing. Negative for voice change.   ?Respiratory:  Negative for shortness of breath and wheezing.   ?Cardiovascular:  Negative for chest pain, palpitations and leg swelling.  ?Gastrointestinal:  Negative for abdominal distention, abdominal pain, anal bleeding, blood in stool, constipation, diarrhea, nausea, rectal pain and vomiting.  ?Musculoskeletal:  Negative for  arthralgias and myalgias.  ?Skin:  Negative for color change and pallor.  ?Neurological:  Negative for dizziness, syncope and weakness.  ?Psychiatric/Behavioral:  Negative for confusion.   ?All other systems reviewed and are negative.  ? ?Medications ?No current facility-administered medications on file prior to encounter.  ? ?Current Outpatient Medications on File Prior to Encounter  ?Medication Sig Dispense Refill  ? olmesartan-hydrochlorothiazide (BENICAR HCT) 40-25 MG tablet Take 1 tablet by mouth daily.    ? pantoprazole (PROTONIX) 40 MG tablet Take 40 mg by mouth daily.    ? tizanidine (ZANAFLEX) 2 MG capsule Take 2 mg by mouth 3 (three) times daily.    ? albuterol (PROVENTIL HFA;VENTOLIN HFA) 108 (90 Base) MCG/ACT inhaler Inhale 2 puffs into the lungs every 6 (six) hours as needed for wheezing or shortness of breath. 1 Inhaler 2  ? amLODipine (NORVASC) 10 MG tablet Take 10 mg by mouth daily.    ? busPIRone (BUSPAR) 10 MG tablet Take 10 mg by mouth 3 (three) times daily.    ? DULoxetine (CYMBALTA) 20 MG capsule Take 20 mg by mouth daily.    ? Fluticasone-Salmeterol (ADVAIR DISKUS) 100-50 MCG/DOSE AEPB Inhale 1 puff into the lungs 2 (two) times daily. 60 each 3  ? lisinopril (PRINIVIL,ZESTRIL) 10 MG tablet Take 10 mg by mouth daily.    ? loratadine (CLARITIN) 10 MG tablet Take 10 mg by mouth daily.    ? metFORMIN (GLUCOPHAGE) 500 MG tablet Take by mouth 2 (two) times daily with a meal.    ? mirtazapine (REMERON) 7.5 MG tablet Take 7.5 mg by mouth at bedtime.    ? prazosin (MINIPRESS) 1 MG capsule Take 1 mg by mouth at bedtime.    ? ? ?  Pertinent medications related to GI and procedure were reviewed by me with the patient prior to the procedure ? ?No current facility-administered medications for this encounter. ? ?Current Outpatient Medications:  ?  olmesartan-hydrochlorothiazide (BENICAR HCT) 40-25 MG tablet, Take 1 tablet by mouth daily., Disp: , Rfl:  ?  pantoprazole (PROTONIX) 40 MG tablet, Take 40 mg by  mouth daily., Disp: , Rfl:  ?  tizanidine (ZANAFLEX) 2 MG capsule, Take 2 mg by mouth 3 (three) times daily., Disp: , Rfl:  ?  albuterol (PROVENTIL HFA;VENTOLIN HFA) 108 (90 Base) MCG/ACT inhaler, Inhale 2 puffs into the lungs every 6 (six) hours as needed for wheezing or shortness of breath., Disp: 1 Inhaler, Rfl: 2 ?  amLODipine (NORVASC) 10 MG tablet, Take 10 mg by mouth daily., Disp: , Rfl:  ?  busPIRone (BUSPAR) 10 MG tablet, Take 10 mg by mouth 3 (three) times daily., Disp: , Rfl:  ?  DULoxetine (CYMBALTA) 20 MG capsule, Take 20 mg by mouth daily., Disp: , Rfl:  ?  Fluticasone-Salmeterol (ADVAIR DISKUS) 100-50 MCG/DOSE AEPB, Inhale 1 puff into the lungs 2 (two) times daily., Disp: 60 each, Rfl: 3 ?  guaiFENesin-codeine 100-10 MG/5ML syrup, Take 5 mLs by mouth every 6 (six) hours as needed., Disp: 100 mL, Rfl: 0 ?  lisinopril (PRINIVIL,ZESTRIL) 10 MG tablet, Take 10 mg by mouth daily., Disp: , Rfl:  ?  loratadine (CLARITIN) 10 MG tablet, Take 10 mg by mouth daily., Disp: , Rfl:  ?  metFORMIN (GLUCOPHAGE) 500 MG tablet, Take by mouth 2 (two) times daily with a meal., Disp: , Rfl:  ?  mirtazapine (REMERON) 7.5 MG tablet, Take 7.5 mg by mouth at bedtime., Disp: , Rfl:  ?  prazosin (MINIPRESS) 1 MG capsule, Take 1 mg by mouth at bedtime., Disp: , Rfl:  ?  ?  ? ?Allergies  ?Allergen Reactions  ? Seasonal Ic [Cholestatin]   ? ?Allergies were reviewed by me prior to the procedure ? ?Objective  ? ? ?There were no vitals filed for this visit. ? ?*** ?Physical Exam ?Vitals and nursing note reviewed.  ?Constitutional:   ?   General: She is not in acute distress. ?   Appearance: Normal appearance. She is not ill-appearing, toxic-appearing or diaphoretic.  ?HENT:  ?   Head: Normocephalic and atraumatic.  ?   Nose: Nose normal.  ?   Mouth/Throat:  ?   Mouth: Mucous membranes are moist.  ?   Pharynx: Oropharynx is clear.  ?Eyes:  ?   General: No scleral icterus. ?   Extraocular Movements: Extraocular movements intact.   ?Cardiovascular:  ?   Rate and Rhythm: Normal rate and regular rhythm.  ?   Heart sounds: Normal heart sounds. No murmur heard. ?  No friction rub. No gallop.  ?Pulmonary:  ?   Effort: Pulmonary effort is normal. No respiratory distress.  ?   Breath sounds: Normal breath sounds. No wheezing, rhonchi or rales.  ?Abdominal:  ?   General: Bowel sounds are normal. There is no distension.  ?   Palpations: Abdomen is soft.  ?   Tenderness: There is no abdominal tenderness. There is no guarding or rebound.  ?Musculoskeletal:  ?   Cervical back: Neck supple.  ?   Right lower leg: No edema.  ?   Left lower leg: No edema.  ?Skin: ?   General: Skin is warm and dry.  ?   Coloration: Skin is not jaundiced or pale.  ?Neurological:  ?  General: No focal deficit present.  ?   Mental Status: She is alert and oriented to person, place, and time. Mental status is at baseline.  ?Psychiatric:     ?   Mood and Affect: Mood normal.     ?   Behavior: Behavior normal.     ?   Thought Content: Thought content normal.     ?   Judgment: Judgment normal.  ? ? ? ?Assessment:  ?[name] is a 56 y.o. female  who presents today for Esophagogastroduodenoscopy and Colonoscopy for Dysphagia with barium swallow consistent with esophageal stricture and initial screening colonoscopy. ? ?Plan:  ?Esophagogastroduodenoscopy and Colonoscopy with possible intervention today ? ?Esophagogastroduodenoscopy and Colonoscopy with possible biopsy, control of bleeding, polypectomy, and interventions as necessary has been discussed with the patient/patient representative. Informed consent was obtained from the patient/patient representative after explaining the indication, nature, and risks of the procedure including but not limited to death, bleeding, perforation, missed neoplasm/lesions, cardiorespiratory compromise, and reaction to medications. Opportunity for questions was given and appropriate answers were provided. Patient/patient representative has  verbalized understanding is amenable to undergoing the procedure. ? ? ?Annamaria Helling, DO  ?[facility] ? ?Portions of the record may have been created with voice recognition software. Oc

## 2021-11-14 ENCOUNTER — Encounter: Payer: Self-pay | Admitting: Anesthesiology

## 2021-11-14 NOTE — Anesthesia Preprocedure Evaluation (Deleted)
Anesthesia Evaluation  ?Patient identified by MRN, date of birth, ID band ?Patient awake ? ? ? ?Reviewed: ?Allergy & Precautions, NPO status , Patient's Chart, lab work & pertinent test results ? ?Airway ?Mallampati: II ? ?TM Distance: >3 FB ?Neck ROM: full ? ? ? Dental ?no notable dental hx. ? ?  ?Pulmonary ?asthma ,  ?H/o bronchitis 12/22 ?  ?Pulmonary exam normal ? ? ? ? ? ? ? Cardiovascular ?hypertension, Normal cardiovascular exam ? ? ?  ?Neuro/Psych ?negative neurological ROS ? negative psych ROS  ? GI/Hepatic ?negative GI ROS, Neg liver ROS,   ?Endo/Other  ?diabetes ? Renal/GU ?negative Renal ROS  ?negative genitourinary ?  ?Musculoskeletal ? ? Abdominal ?(+) + obese,   ?Peds ? Hematology ?negative hematology ROS ?(+)   ?Anesthesia Other Findings ?Past Medical History: ?No date: Asthma ?No date: Hypertension ? ?Past Surgical History: ?No date: JOINT REPLACEMENT ?No date: TUBAL LIGATION ? ? ? ? Reproductive/Obstetrics ?negative OB ROS ? ?  ? ? ? ? ? ? ? ? ? ? ? ? ? ?  ?  ? ? ? ? ? ? ? ? ?Anesthesia Physical ?Anesthesia Plan ? ?ASA: 2 ? ?Anesthesia Plan: General  ? ?Post-op Pain Management:   ? ?Induction:  ? ?PONV Risk Score and Plan: Propofol infusion and TIVA ? ?Airway Management Planned: Natural Airway and Simple Face Mask ? ?Additional Equipment:  ? ?Intra-op Plan:  ? ?Post-operative Plan:  ? ?Informed Consent:  ? ?Plan Discussed with: Anesthesiologist, CRNA and Surgeon ? ?Anesthesia Plan Comments:   ? ? ? ? ? ? ?Anesthesia Quick Evaluation ? ?

## 2022-01-13 DIAGNOSIS — J4530 Mild persistent asthma, uncomplicated: Secondary | ICD-10-CM | POA: Diagnosis not present

## 2022-01-13 DIAGNOSIS — K219 Gastro-esophageal reflux disease without esophagitis: Secondary | ICD-10-CM | POA: Diagnosis not present

## 2022-01-13 DIAGNOSIS — R0609 Other forms of dyspnea: Secondary | ICD-10-CM | POA: Diagnosis not present

## 2022-01-13 DIAGNOSIS — J310 Chronic rhinitis: Secondary | ICD-10-CM | POA: Diagnosis not present

## 2022-01-15 DIAGNOSIS — E119 Type 2 diabetes mellitus without complications: Secondary | ICD-10-CM | POA: Diagnosis not present

## 2022-01-20 DIAGNOSIS — R3129 Other microscopic hematuria: Secondary | ICD-10-CM | POA: Diagnosis not present

## 2022-01-20 DIAGNOSIS — E119 Type 2 diabetes mellitus without complications: Secondary | ICD-10-CM | POA: Diagnosis not present

## 2022-01-20 DIAGNOSIS — I10 Essential (primary) hypertension: Secondary | ICD-10-CM | POA: Diagnosis not present

## 2022-01-20 DIAGNOSIS — R1310 Dysphagia, unspecified: Secondary | ICD-10-CM | POA: Diagnosis not present

## 2022-01-20 DIAGNOSIS — L309 Dermatitis, unspecified: Secondary | ICD-10-CM | POA: Diagnosis not present

## 2022-01-20 DIAGNOSIS — J019 Acute sinusitis, unspecified: Secondary | ICD-10-CM | POA: Diagnosis not present

## 2022-01-20 DIAGNOSIS — M722 Plantar fascial fibromatosis: Secondary | ICD-10-CM | POA: Diagnosis not present

## 2022-01-20 DIAGNOSIS — M5136 Other intervertebral disc degeneration, lumbar region: Secondary | ICD-10-CM | POA: Diagnosis not present

## 2022-01-20 DIAGNOSIS — K219 Gastro-esophageal reflux disease without esophagitis: Secondary | ICD-10-CM | POA: Diagnosis not present

## 2022-01-20 DIAGNOSIS — J45909 Unspecified asthma, uncomplicated: Secondary | ICD-10-CM | POA: Diagnosis not present

## 2022-02-26 ENCOUNTER — Encounter: Payer: Self-pay | Admitting: Gastroenterology

## 2022-02-26 NOTE — H&P (Signed)
Pre-Procedure H&P   Patient ID: Samantha Deleon is a 56 y.o. female.  Gastroenterology Provider: Annamaria Helling, DO  Referring Provider: Laurine Blazer, PA PCP: Jodi Marble, MD  Date: 02/27/2022  HPI Samantha Deleon is a 56 y.o. female who presents today for Esophagogastroduodenoscopy and Colonoscopy for dysphagia and colon cancer screening. Patient has daily bowel movement without melena hematochezia diarrhea or constipation.  She occasionally uses stool softeners.  She has no family history of colon cancer or colon polyps.  Patient has noted dysphagia to multiple consistencies but specifically eggs.  She has no pill or liquid dysphagia.  After starting PPI-Protonix 40 mg daily she has had improvement with dysphagia and epigastric burning.  She denies any odynophagia.  Previously used ibuprofen daily now occasionally uses Aleve. Does use celebrex  Underwent barium swallow study in May 2022 demonstrating distal esophageal stricture with difficult passage of the 13 mm barium tablet.  Most recent labs creatinine 0.99 hemoglobin 12.2 MCV 83 platelets 257,000  Mat Aunt- CRC at 48  Past Medical History:  Diagnosis Date   Asthma    Hypertension     Past Surgical History:  Procedure Laterality Date   JOINT REPLACEMENT Bilateral    Hips   TUBAL LIGATION      Family History No h/o GI disease or malignancy  Review of Systems  Constitutional:  Negative for activity change, appetite change, chills, diaphoresis, fatigue, fever and unexpected weight change.  HENT:  Positive for trouble swallowing. Negative for voice change.   Respiratory:  Negative for shortness of breath and wheezing.   Cardiovascular:  Negative for chest pain, palpitations and leg swelling.  Gastrointestinal:  Negative for abdominal distention, abdominal pain, anal bleeding, blood in stool, constipation, diarrhea, nausea, rectal pain and vomiting.  Musculoskeletal:  Negative for arthralgias and  myalgias.  Skin:  Negative for color change and pallor.  Neurological:  Negative for dizziness, syncope and weakness.  Psychiatric/Behavioral:  Negative for confusion.   All other systems reviewed and are negative.    Medications No current facility-administered medications on file prior to encounter.   Current Outpatient Medications on File Prior to Encounter  Medication Sig Dispense Refill   albuterol (PROVENTIL HFA;VENTOLIN HFA) 108 (90 Base) MCG/ACT inhaler Inhale 2 puffs into the lungs every 6 (six) hours as needed for wheezing or shortness of breath. 1 Inhaler 2   amLODipine (NORVASC) 10 MG tablet Take 10 mg by mouth daily.     busPIRone (BUSPAR) 10 MG tablet Take 10 mg by mouth 3 (three) times daily.     celecoxib (CELEBREX) 200 MG capsule Take 200 mg by mouth 2 (two) times daily.     DULoxetine (CYMBALTA) 20 MG capsule Take 20 mg by mouth daily.     Fluticasone-Salmeterol (ADVAIR DISKUS) 100-50 MCG/DOSE AEPB Inhale 1 puff into the lungs 2 (two) times daily. 60 each 3   loratadine (CLARITIN) 10 MG tablet Take 10 mg by mouth daily.     metFORMIN (GLUCOPHAGE) 500 MG tablet Take by mouth 2 (two) times daily with a meal.     mirtazapine (REMERON) 7.5 MG tablet Take 7.5 mg by mouth at bedtime.     montelukast (SINGULAIR) 10 MG tablet Take 10 mg by mouth at bedtime.     olmesartan-hydrochlorothiazide (BENICAR HCT) 40-25 MG tablet Take 1 tablet by mouth daily.     pantoprazole (PROTONIX) 40 MG tablet Take 40 mg by mouth daily.     prazosin (MINIPRESS) 1 MG capsule  Take 1 mg by mouth at bedtime.     tizanidine (ZANAFLEX) 2 MG capsule Take 2 mg by mouth 3 (three) times daily.     lisinopril (PRINIVIL,ZESTRIL) 10 MG tablet Take 10 mg by mouth daily.      Pertinent medications related to GI and procedure were reviewed by me with the patient prior to the procedure   Current Facility-Administered Medications:    0.9 %  sodium chloride infusion, , Intravenous, Continuous, Annamaria Helling, DO, Last Rate: 20 mL/hr at 02/27/22 0716, New Bag at 02/27/22 0716      Allergies  Allergen Reactions   Seasonal Ic [Cholestatin]    Allergies were reviewed by me prior to the procedure  Objective   Body mass index is 33.65 kg/m. Vitals:   02/27/22 0702  BP: 133/87  Pulse: 82  Resp: 18  Temp: (!) 97.1 F (36.2 C)  TempSrc: Temporal  SpO2: 99%  Weight: 83.5 kg  Height: '5\' 2"'$  (1.575 m)     Physical Exam Vitals and nursing note reviewed.  Constitutional:      General: She is not in acute distress.    Appearance: Normal appearance. She is obese. She is not ill-appearing, toxic-appearing or diaphoretic.  HENT:     Head: Normocephalic and atraumatic.     Nose: Nose normal.     Mouth/Throat:     Mouth: Mucous membranes are moist.     Pharynx: Oropharynx is clear.  Eyes:     General: No scleral icterus.    Extraocular Movements: Extraocular movements intact.  Cardiovascular:     Rate and Rhythm: Normal rate and regular rhythm.     Heart sounds: Normal heart sounds. No murmur heard.    No friction rub. No Deleon.  Pulmonary:     Effort: Pulmonary effort is normal. No respiratory distress.     Breath sounds: Normal breath sounds. No wheezing, rhonchi or rales.  Abdominal:     General: Bowel sounds are normal. There is no distension.     Palpations: Abdomen is soft.     Tenderness: There is no abdominal tenderness. There is no guarding or rebound.  Musculoskeletal:     Cervical back: Neck supple.     Right lower leg: No edema.     Left lower leg: No edema.  Skin:    General: Skin is warm and dry.     Coloration: Skin is not jaundiced or pale.  Neurological:     General: No focal deficit present.     Mental Status: She is alert and oriented to person, place, and time. Mental status is at baseline.  Psychiatric:        Mood and Affect: Mood normal.        Behavior: Behavior normal.        Thought Content: Thought content normal.        Judgment: Judgment  normal.      Assessment:  Samantha Deleon is a 56 y.o. female  who presents today for Esophagogastroduodenoscopy and Colonoscopy for dysphagia, colorectal cancer screening.  Plan:  Esophagogastroduodenoscopy and Colonoscopy with possible intervention today  Esophagogastroduodenoscopy and Colonoscopy with possible biopsy, control of bleeding, polypectomy, and interventions as necessary has been discussed with the patient/patient representative. Informed consent was obtained from the patient/patient representative after explaining the indication, nature, and risks of the procedure including but not limited to death, bleeding, perforation, missed neoplasm/lesions, cardiorespiratory compromise, and reaction to medications. Opportunity for questions was given and appropriate  answers were provided. Patient/patient representative has verbalized understanding is amenable to undergoing the procedure[place]  [id number] [facility]enter[facility]  Portions of the record may have been created with voice recognition software. Occasional wrong-word or 'sound-a-like' substitutions may have occurred due to the inherent limitations of voice recognition software.  Read the chart carefully and recognize, using context, where substitutions may have occurred.

## 2022-02-27 ENCOUNTER — Ambulatory Visit
Admission: RE | Admit: 2022-02-27 | Discharge: 2022-02-27 | Disposition: A | Discharge status: Home / Self Care | Payer: Medicare Other | Attending: Gastroenterology | Admitting: Gastroenterology

## 2022-02-27 ENCOUNTER — Ambulatory Visit: Admit: 2022-02-27 | Payer: Medicare Other | Admitting: Gastroenterology

## 2022-02-27 ENCOUNTER — Ambulatory Visit: Payer: Medicare Other | Admitting: Anesthesiology

## 2022-02-27 ENCOUNTER — Encounter: Payer: Self-pay | Admitting: Gastroenterology

## 2022-02-27 ENCOUNTER — Encounter
Admission: RE | Disposition: A | Discharge status: Home / Self Care | Payer: Self-pay | Source: Home / Self Care | Attending: Gastroenterology

## 2022-02-27 ENCOUNTER — Other Ambulatory Visit: Payer: Self-pay

## 2022-02-27 DIAGNOSIS — Z7984 Long term (current) use of oral hypoglycemic drugs: Secondary | ICD-10-CM | POA: Diagnosis not present

## 2022-02-27 DIAGNOSIS — K222 Esophageal obstruction: Secondary | ICD-10-CM | POA: Diagnosis not present

## 2022-02-27 DIAGNOSIS — I2510 Atherosclerotic heart disease of native coronary artery without angina pectoris: Secondary | ICD-10-CM | POA: Diagnosis not present

## 2022-02-27 DIAGNOSIS — K5730 Diverticulosis of large intestine without perforation or abscess without bleeding: Secondary | ICD-10-CM | POA: Diagnosis not present

## 2022-02-27 DIAGNOSIS — E119 Type 2 diabetes mellitus without complications: Secondary | ICD-10-CM | POA: Insufficient documentation

## 2022-02-27 DIAGNOSIS — K621 Rectal polyp: Secondary | ICD-10-CM | POA: Insufficient documentation

## 2022-02-27 DIAGNOSIS — J45909 Unspecified asthma, uncomplicated: Secondary | ICD-10-CM | POA: Insufficient documentation

## 2022-02-27 DIAGNOSIS — K449 Diaphragmatic hernia without obstruction or gangrene: Secondary | ICD-10-CM | POA: Diagnosis not present

## 2022-02-27 DIAGNOSIS — K640 First degree hemorrhoids: Secondary | ICD-10-CM | POA: Diagnosis not present

## 2022-02-27 DIAGNOSIS — I10 Essential (primary) hypertension: Secondary | ICD-10-CM | POA: Insufficient documentation

## 2022-02-27 DIAGNOSIS — K2100 Gastro-esophageal reflux disease with esophagitis, without bleeding: Secondary | ICD-10-CM | POA: Insufficient documentation

## 2022-02-27 DIAGNOSIS — K2970 Gastritis, unspecified, without bleeding: Secondary | ICD-10-CM | POA: Insufficient documentation

## 2022-02-27 DIAGNOSIS — R1310 Dysphagia, unspecified: Secondary | ICD-10-CM | POA: Diagnosis not present

## 2022-02-27 DIAGNOSIS — K209 Esophagitis, unspecified without bleeding: Secondary | ICD-10-CM | POA: Diagnosis not present

## 2022-02-27 DIAGNOSIS — Z79899 Other long term (current) drug therapy: Secondary | ICD-10-CM | POA: Insufficient documentation

## 2022-02-27 DIAGNOSIS — K635 Polyp of colon: Secondary | ICD-10-CM | POA: Diagnosis not present

## 2022-02-27 DIAGNOSIS — D128 Benign neoplasm of rectum: Secondary | ICD-10-CM | POA: Diagnosis not present

## 2022-02-27 DIAGNOSIS — Z1211 Encounter for screening for malignant neoplasm of colon: Secondary | ICD-10-CM | POA: Diagnosis not present

## 2022-02-27 HISTORY — PX: ESOPHAGOGASTRODUODENOSCOPY: SHX5428

## 2022-02-27 HISTORY — PX: COLONOSCOPY: SHX5424

## 2022-02-27 SURGERY — COLONOSCOPY
Anesthesia: General

## 2022-02-27 MED ORDER — LIDOCAINE HCL (PF) 2 % IJ SOLN
INTRAMUSCULAR | Status: AC
  Filled 2022-02-27: qty 10

## 2022-02-27 MED ORDER — LIDOCAINE HCL (CARDIAC) PF 100 MG/5ML IV SOSY
PREFILLED_SYRINGE | INTRAVENOUS | Status: DC | PRN
  Administered 2022-02-27: 100 mg via INTRAVENOUS

## 2022-02-27 MED ORDER — PROPOFOL 10 MG/ML IV BOLUS
INTRAVENOUS | Status: DC | PRN
  Administered 2022-02-27 (×2): 40 mg via INTRAVENOUS
  Administered 2022-02-27: 50 mg via INTRAVENOUS
  Administered 2022-02-27: 40 mg via INTRAVENOUS
  Administered 2022-02-27: 50 mg via INTRAVENOUS
  Administered 2022-02-27 (×3): 40 mg via INTRAVENOUS
  Administered 2022-02-27 (×2): 50 mg via INTRAVENOUS
  Administered 2022-02-27: 120 mg via INTRAVENOUS
  Administered 2022-02-27: 100 mg via INTRAVENOUS
  Administered 2022-02-27 (×5): 40 mg via INTRAVENOUS

## 2022-02-27 MED ORDER — PHENYLEPHRINE 80 MCG/ML (10ML) SYRINGE FOR IV PUSH (FOR BLOOD PRESSURE SUPPORT)
PREFILLED_SYRINGE | INTRAVENOUS | Status: AC
  Filled 2022-02-27: qty 10

## 2022-02-27 MED ORDER — SODIUM CHLORIDE 0.9 % IV SOLN: INTRAVENOUS | Status: DC

## 2022-02-27 MED ORDER — PROPOFOL 1000 MG/100ML IV EMUL
INTRAVENOUS | Status: AC
  Filled 2022-02-27: qty 100

## 2022-02-27 MED ORDER — PROPOFOL 1000 MG/100ML IV EMUL
INTRAVENOUS | Status: AC
  Filled 2022-02-27: qty 200

## 2022-02-27 MED ORDER — LIDOCAINE HCL (PF) 2 % IJ SOLN
INTRAMUSCULAR | Status: AC
  Filled 2022-02-27: qty 5

## 2022-02-27 NOTE — Anesthesia Preprocedure Evaluation (Signed)
Anesthesia Evaluation  Patient identified by MRN, date of birth, ID band Patient awake    Reviewed: Allergy & Precautions, NPO status , Patient's Chart, lab work & pertinent test results  Airway Mallampati: III  TM Distance: >3 FB Neck ROM: Full    Dental  (+) Teeth Intact   Pulmonary neg pulmonary ROS, asthma ,    Pulmonary exam normal  + decreased breath sounds      Cardiovascular Exercise Tolerance: Good hypertension, Pt. on medications + CAD  negative cardio ROS Normal cardiovascular exam Rhythm:Regular Rate:Normal     Neuro/Psych negative neurological ROS  negative psych ROS   GI/Hepatic negative GI ROS, Neg liver ROS,   Endo/Other  negative endocrine ROSdiabetes, Well Controlled, Type 2, Oral Hypoglycemic Agents  Renal/GU negative Renal ROS  negative genitourinary   Musculoskeletal negative musculoskeletal ROS (+)   Abdominal (+) + obese,   Peds negative pediatric ROS (+)  Hematology negative hematology ROS (+)   Anesthesia Other Findings Past Medical History: No date: Asthma No date: Hypertension  Past Surgical History: No date: JOINT REPLACEMENT; Bilateral     Comment:  Hips No date: TUBAL LIGATION     Reproductive/Obstetrics negative OB ROS                             Anesthesia Physical Anesthesia Plan  ASA: 3  Anesthesia Plan: General   Post-op Pain Management:    Induction: Intravenous  PONV Risk Score and Plan: Propofol infusion and TIVA  Airway Management Planned: Natural Airway  Additional Equipment:   Intra-op Plan:   Post-operative Plan:   Informed Consent: I have reviewed the patients History and Physical, chart, labs and discussed the procedure including the risks, benefits and alternatives for the proposed anesthesia with the patient or authorized representative who has indicated his/her understanding and acceptance.     Dental Advisory  Given  Plan Discussed with: CRNA and Surgeon  Anesthesia Plan Comments:         Anesthesia Quick Evaluation

## 2022-02-27 NOTE — Transfer of Care (Signed)
Immediate Anesthesia Transfer of Care Note  Patient: Samantha Deleon  Procedure(s) Performed: COLONOSCOPY ESOPHAGOGASTRODUODENOSCOPY (EGD)  Patient Location: Endoscopy Unit  Anesthesia Type:General  Level of Consciousness: awake, alert  and oriented  Airway & Oxygen Therapy: Patient Spontanous Breathing and Patient connected to nasal cannula oxygen  Post-op Assessment: Report given to RN, Post -op Vital signs reviewed and stable and Patient moving all extremities  Post vital signs: Reviewed and stable  Last Vitals:  Vitals Value Taken Time  BP 121/73 02/27/22 0842  Temp    Pulse 77 02/27/22 0844  Resp 17 02/27/22 0844  SpO2 100 % 02/27/22 0844  Vitals shown include unvalidated device data.  Last Pain:  Vitals:   02/27/22 0702  TempSrc: Temporal  PainSc: 0-No pain         Complications: No notable events documented.

## 2022-02-27 NOTE — Interval H&P Note (Signed)
History and Physical Interval Note: Preprocedure H&P from 02/27/22  was reviewed and there was no interval change after seeing and examining the patient.  Written consent was obtained from the patient after discussion of risks, benefits, and alternatives. Patient has consented to proceed with Esophagogastroduodenoscopy and Colonoscopy with possible intervention   02/27/2022 7:42 AM  Samantha Deleon  has presented today for surgery, with the diagnosis of Dysphagia, unspecified type (R13.10) Colon cancer screening (Z12.11).  The various methods of treatment have been discussed with the patient and family. After consideration of risks, benefits and other options for treatment, the patient has consented to  Procedure(s): COLONOSCOPY (N/A) ESOPHAGOGASTRODUODENOSCOPY (EGD) (N/A) as a surgical intervention.  The patient's history has been reviewed, patient examined, no change in status, stable for surgery.  I have reviewed the patient's chart and labs.  Questions were answered to the patient's satisfaction.     Samantha Deleon

## 2022-02-27 NOTE — Op Note (Signed)
[facility] Gastroenter[facility] Patient Name: Samantha Deleon Procedure Date: 02/27/2022 7:11 AM MRN: [id number] Account #: [id number] Date of Birth: 1966/02/10 Admit Type: Outpatient Age: 56 Room: [facility] [address] ENDO ROOM 1 Gender: Female Note Status: Finalized Instrument Name: Colonoscope [id number] Procedure:             Colonoscopy Indications:           Screening for colorectal malignant neoplasm Providers:             Trenda Moots, DO Referring MD:          Silas Flood. Ellsworth Lennox, MD (Referring MD) Medicines:             Monitored Anesthesia Care Complications:         No immediate complications. Estimated blood loss:                         Minimal. Procedure:             Pre-Anesthesia Assessment:                        - Prior to the procedure, a History and Physical was                         performed, and patient medications and allergies were                         reviewed. The patient is competent. The risks and                         benefits of the procedure and the sedation options and                         risks were discussed with the patient. All questions                         were answered and informed consent was obtained.                         Patient identification and proposed procedure were                         verified by the physician, the nurse, the anesthetist                         and the technician in the endoscopy suite. Mental                         Status Examination: alert and oriented. Airway                         Examination: normal oropharyngeal airway and neck                         mobility. Respiratory Examination: clear to                         auscultation. CV Examination: RRR, no murmurs, no S3  or S4. Prophylactic Antibiotics: The patient does not                         require prophylactic antibiotics. Prior                         Anticoagulants: The patient has taken no previous                          anticoagulant or antiplatelet agents. ASA Grade                         Assessment: III - A patient with severe systemic                         disease. After reviewing the risks and benefits, the                         patient was deemed in satisfactory condition to                         undergo the procedure. The anesthesia plan was to use                         monitored anesthesia care (MAC). Immediately prior to                         administration of medications, the patient was                         re-assessed for adequacy to receive sedatives. The                         heart rate, respiratory rate, oxygen saturations,                         blood pressure, adequacy of pulmonary ventilation, and                         response to care were monitored throughout the                         procedure. The physical status of the patient was                         re-assessed after the procedure.                        After obtaining informed consent, the colonoscope was                         passed under direct vision. Throughout the procedure,                         the patient's blood pressure, pulse, and oxygen                         saturations were monitored continuously. The  Colonoscope was introduced through the anus and                         advanced to the the terminal ileum, with                         identification of the appendiceal orifice and IC                         valve. The colonoscopy was performed without                         difficulty. The patient tolerated the procedure well.                         The quality of the bowel preparation was evaluated                         using the BBPS [facility] Bowel Preparation Scale) with                         scores of: Right Colon = 3, Transverse Colon = 3 and                         Left Colon = 3 (entire mucosa seen well with no                          residual staining, small fragments of stool or opaque                         liquid). The total BBPS score equals 9. The terminal                         ileum, ileocecal valve, appendiceal orifice, and                         rectum were photographed. Findings:      The perianal and digital rectal examinations were normal. Pertinent       negatives include normal sphincter tone.      The terminal ileum appeared normal. Estimated blood loss: none.      Multiple small and large-mouthed diverticula were found in the entire       colon. Estimated blood loss: none.      A 1 to 2 mm polyp was found in the rectum. The polyp was sessile. The       polyp was removed with a jumbo cold forceps. Resection and retrieval       were complete. Estimated blood loss was minimal.      Non-bleeding internal hemorrhoids were found during retroflexion. The       hemorrhoids were Grade I (internal hemorrhoids that do not prolapse).       Estimated blood loss: none.      The exam was otherwise without abnormality on direct and retroflexion       views. Impression:            - The examined portion of the ileum was normal.                        -  Diverticulosis in the entire examined colon.                        - One 1 to 2 mm polyp in the rectum, removed with a                         jumbo cold forceps. Resected and retrieved.                        - Non-bleeding internal hemorrhoids.                        - The examination was otherwise normal on direct and                         retroflexion views. Recommendation:        - Discharge patient to home.                        - Resume previous diet.                        - Continue present medications.                        - Await pathology results.                        - Repeat colonoscopy for surveillance based on                         pathology results.                        - Return to referring physician as previously                          scheduled.                        - The findings and recommendations were discussed with                         the patient. Procedure Code(s):     --- Professional ---                        [phone], Colonoscopy, flexible; with biopsy, single or                         multiple Diagnosis Code(s):     --- Professional ---                        Z12.11, Encounter for screening for malignant neoplasm                         of colon                        K64.0, First degree hemorrhoids                        K62.1,  Rectal polyp                        K57.30, Diverticulosis of large intestine without                         perforation or abscess without bleeding CPT copyright 2019 American Medical Association. All rights reserved. The codes documented in this report are preliminary and upon coder review may  be revised to meet current compliance requirements. Attending Participation:      I personally performed the entire procedure. Elfredia Nevins, DO Jaynie Collins DO, DO 02/27/2022 8:55:02 AM This report has been signed electronically. Number of Addenda: 0 Note Initiated On: 02/27/2022 7:11 AM Scope Withdrawal Time: 0 hours 15 minutes 8 seconds  Total Procedure Duration: 0 hours 22 minutes 33 seconds  Estimated Blood Loss:  Estimated blood loss was minimal.      [facility]

## 2022-02-27 NOTE — Anesthesia Postprocedure Evaluation (Signed)
Anesthesia Post Note  Patient: Samantha Deleon  Procedure(s) Performed: COLONOSCOPY ESOPHAGOGASTRODUODENOSCOPY (EGD)  Patient location during evaluation: PACU Anesthesia Type: General Level of consciousness: awake and oriented Pain management: pain level controlled Vital Signs Assessment: post-procedure vital signs reviewed and stable Respiratory status: spontaneous breathing and respiratory function stable Cardiovascular status: stable Anesthetic complications: no   No notable events documented.   Last Vitals:  Vitals:   02/27/22 0842 02/27/22 0902  BP:  (!) 139/92  Pulse:    Resp:    Temp: (!) 36.2 C   SpO2:      Last Pain:  Vitals:   02/27/22 0902  TempSrc:   PainSc: 0-No pain                 VAN STAVEREN,Sivan Cuello

## 2022-02-27 NOTE — Op Note (Signed)
[facility] Gastroenterology Patient Name: Samantha Deleon Procedure Date: 02/27/2022 7:11 AM MRN: [id number] Account #: [id number] Date of Birth: 18-Sep-1965 Admit Type: Outpatient Age: 56 Room: [facility] ENDO ROOM 1 Gender: Female Note Status: Finalized Instrument Name: Patton Salles Endoscope [id number] Procedure:             Upper GI endoscopy Indications:           Dysphagia Providers:             Trenda Moots, DO Referring MD:          Silas Flood. Ellsworth Lennox, MD (Referring MD) Medicines:             Monitored Anesthesia Care Complications:         No immediate complications. Estimated blood loss:                         Minimal. Procedure:             Pre-Anesthesia Assessment:                        - Prior to the procedure, a History and Physical was                         performed, and patient medications and allergies were                         reviewed. The patient is competent. The risks and                         benefits of the procedure and the sedation options and                         risks were discussed with the patient. All questions                         were answered and informed consent was obtained.                         Patient identification and proposed procedure were                         verified by the physician, the nurse, the anesthetist                         and the technician in the endoscopy suite. Mental                         Status Examination: alert and oriented. Airway                         Examination: normal oropharyngeal airway and neck                         mobility. Respiratory Examination: clear to                         auscultation. CV Examination: RRR, no murmurs, no S3  or S4. Prophylactic Antibiotics: The patient does not                         require prophylactic antibiotics. Prior                         Anticoagulants: The patient has taken no previous                          anticoagulant or antiplatelet agents. ASA Grade                         Assessment: III - A patient with severe systemic                         disease. After reviewing the risks and benefits, the                         patient was deemed in satisfactory condition to                         undergo the procedure. The anesthesia plan was to use                         monitored anesthesia care (MAC). Immediately prior to                         administration of medications, the patient was                         re-assessed for adequacy to receive sedatives. The                         heart rate, respiratory rate, oxygen saturations,                         blood pressure, adequacy of pulmonary ventilation, and                         response to care were monitored throughout the                         procedure. The physical status of the patient was                         re-assessed after the procedure.                        After obtaining informed consent, the endoscope was                         passed under direct vision. Throughout the procedure,                         the patient's blood pressure, pulse, and oxygen                         saturations were monitored continuously. The Endoscope  was introduced through the mouth, and advanced to the                         second part of duodenum. The upper GI endoscopy was                         accomplished without difficulty. The patient tolerated                         the procedure well. Findings:      The duodenal bulb, first portion of the duodenum and second portion of       the duodenum were normal. Estimated blood loss: none.      Localized mild inflammation characterized by erythema was found in the       gastric antrum. Biopsies were taken with a cold forceps for Helicobacter       pylori testing. Estimated blood loss was minimal.      A 3 cm hiatal hernia was present. Estimated blood  loss: none.      The Z-line was regular. Estimated blood loss: none.      LA Grade A (one or more mucosal breaks less than 5 mm, not extending       between tops of 2 mucosal folds) esophagitis with no bleeding was found.       Estimated blood loss: none.      A widely patent Schatzki ring was found in the lower third of the       esophagus. A guidewire was placed and the scope was withdrawn. Dilation       was performed with a Savary dilator with no resistance at 39 Fr, 42 Fr       and 45 Fr. The dilation site was examined following endoscope       reinsertion and showed mild mucosal disruption. This was biopsied with a       cold forceps for to further break schatski's ring. Estimated blood loss       was minimal.      The exam of the esophagus was otherwise normal. Impression:            - Normal duodenal bulb, first portion of the duodenum                         and second portion of the duodenum.                        - Gastritis. Biopsied.                        - 3 cm hiatal hernia.                        - Z-line regular.                        - LA Grade A reflux esophagitis with no bleeding.                        - Widely patent Schatzki ring. Dilated. Biopsied. Recommendation:        - Discharge patient to home.                        -  Soft diet today.                        - Continue present medications.                        - Increase protonix/pantoprazole to twice a day                        - Await pathology results.                        - Return to GI clinic as previously scheduled.                        - The findings and recommendations were discussed with                         the patient.                        - proceed with colonoscopy                        - No aspirin, ibuprofen, naproxen, or other                         non-steroidal anti-inflammatory drugs for 5 days. Procedure Code(s):     --- Professional ---                        [phone],  Esophagogastroduodenoscopy, flexible,                         transoral; with insertion of guide wire followed by                         passage of dilator(s) through esophagus over guide wire                        [id number], 59, Esophagogastroduodenoscopy, flexible,                         transoral; with biopsy, single or multiple Diagnosis Code(s):     --- Professional ---                        K29.70, Gastritis, unspecified, without bleeding                        K44.9, Diaphragmatic hernia without obstruction or                         gangrene                        K21.00, Gastro-esophageal reflux disease with                         esophagitis, without bleeding                        K22.2, Esophageal obstruction  R13.10, Dysphagia, unspecified CPT copyright 2019 American Medical Association. All rights reserved. The codes documented in this report are preliminary and upon coder review may  be revised to meet current compliance requirements. Attending Participation:      I personally performed the entire procedure. Elfredia Nevins, DO Jaynie Collins DO, DO 02/27/2022 8:12:53 AM This report has been signed electronically. Number of Addenda: 0 Note Initiated On: 02/27/2022 7:11 AM Estimated Blood Loss:  Estimated blood loss was minimal.      [facility]

## 2022-03-02 LAB — SURGICAL PATHOLOGY

## 2022-04-29 DIAGNOSIS — H35033 Hypertensive retinopathy, bilateral: Secondary | ICD-10-CM | POA: Diagnosis not present

## 2022-04-29 DIAGNOSIS — E119 Type 2 diabetes mellitus without complications: Secondary | ICD-10-CM | POA: Diagnosis not present

## 2022-05-13 DIAGNOSIS — E119 Type 2 diabetes mellitus without complications: Secondary | ICD-10-CM | POA: Diagnosis not present

## 2022-05-22 DIAGNOSIS — J4530 Mild persistent asthma, uncomplicated: Secondary | ICD-10-CM | POA: Diagnosis not present

## 2022-06-15 DIAGNOSIS — M5136 Other intervertebral disc degeneration, lumbar region: Secondary | ICD-10-CM | POA: Diagnosis not present

## 2022-06-15 DIAGNOSIS — R1310 Dysphagia, unspecified: Secondary | ICD-10-CM | POA: Diagnosis not present

## 2022-06-15 DIAGNOSIS — Z23 Encounter for immunization: Secondary | ICD-10-CM | POA: Diagnosis not present

## 2022-06-15 DIAGNOSIS — R3129 Other microscopic hematuria: Secondary | ICD-10-CM | POA: Diagnosis not present

## 2022-06-15 DIAGNOSIS — E119 Type 2 diabetes mellitus without complications: Secondary | ICD-10-CM | POA: Diagnosis not present

## 2022-06-15 DIAGNOSIS — M722 Plantar fascial fibromatosis: Secondary | ICD-10-CM | POA: Diagnosis not present

## 2022-06-15 DIAGNOSIS — K219 Gastro-esophageal reflux disease without esophagitis: Secondary | ICD-10-CM | POA: Diagnosis not present

## 2022-06-15 DIAGNOSIS — L309 Dermatitis, unspecified: Secondary | ICD-10-CM | POA: Diagnosis not present

## 2022-08-12 DIAGNOSIS — E119 Type 2 diabetes mellitus without complications: Secondary | ICD-10-CM | POA: Diagnosis not present

## 2022-08-12 DIAGNOSIS — R234 Changes in skin texture: Secondary | ICD-10-CM | POA: Diagnosis not present

## 2022-08-12 DIAGNOSIS — B351 Tinea unguium: Secondary | ICD-10-CM | POA: Diagnosis not present

## 2022-08-12 DIAGNOSIS — M2042 Other hammer toe(s) (acquired), left foot: Secondary | ICD-10-CM | POA: Diagnosis not present

## 2022-08-12 DIAGNOSIS — M2041 Other hammer toe(s) (acquired), right foot: Secondary | ICD-10-CM | POA: Diagnosis not present

## 2022-08-12 DIAGNOSIS — L309 Dermatitis, unspecified: Secondary | ICD-10-CM | POA: Diagnosis not present

## 2022-08-12 DIAGNOSIS — M79674 Pain in right toe(s): Secondary | ICD-10-CM | POA: Diagnosis not present

## 2022-08-12 DIAGNOSIS — M79675 Pain in left toe(s): Secondary | ICD-10-CM | POA: Diagnosis not present

## 2022-09-15 DIAGNOSIS — I10 Essential (primary) hypertension: Secondary | ICD-10-CM | POA: Diagnosis not present

## 2022-09-15 DIAGNOSIS — E119 Type 2 diabetes mellitus without complications: Secondary | ICD-10-CM | POA: Diagnosis not present

## 2022-09-21 DIAGNOSIS — J4520 Mild intermittent asthma, uncomplicated: Secondary | ICD-10-CM | POA: Diagnosis not present

## 2022-09-23 DIAGNOSIS — M722 Plantar fascial fibromatosis: Secondary | ICD-10-CM | POA: Diagnosis not present

## 2022-09-23 DIAGNOSIS — E119 Type 2 diabetes mellitus without complications: Secondary | ICD-10-CM | POA: Diagnosis not present

## 2022-09-23 DIAGNOSIS — R1310 Dysphagia, unspecified: Secondary | ICD-10-CM | POA: Diagnosis not present

## 2022-09-23 DIAGNOSIS — R3129 Other microscopic hematuria: Secondary | ICD-10-CM | POA: Diagnosis not present

## 2022-09-23 DIAGNOSIS — K219 Gastro-esophageal reflux disease without esophagitis: Secondary | ICD-10-CM | POA: Diagnosis not present

## 2022-09-23 DIAGNOSIS — M5136 Other intervertebral disc degeneration, lumbar region: Secondary | ICD-10-CM | POA: Diagnosis not present

## 2022-09-23 DIAGNOSIS — L309 Dermatitis, unspecified: Secondary | ICD-10-CM | POA: Diagnosis not present

## 2022-10-13 ENCOUNTER — Other Ambulatory Visit: Payer: Self-pay | Admitting: Internal Medicine

## 2022-10-13 NOTE — Telephone Encounter (Signed)
Refill Not Appropriate

## 2022-10-20 ENCOUNTER — Other Ambulatory Visit: Payer: Self-pay | Admitting: Internal Medicine

## 2022-11-13 ENCOUNTER — Other Ambulatory Visit: Payer: Self-pay

## 2022-11-13 ENCOUNTER — Telehealth: Payer: Self-pay

## 2022-11-13 MED ORDER — AZITHROMYCIN 250 MG PO TABS
ORAL_TABLET | ORAL | 0 refills | Status: AC
  Filled 2022-11-13: qty 6, 5d supply, fill #0

## 2022-11-13 NOTE — Telephone Encounter (Signed)
This is TJ patient she is requesting a zpack or cough syrup stating that she's been sick for 4 days with this and its not getting any better, she can't get the covid test done do to her insurance

## 2022-11-13 NOTE — Addendum Note (Signed)
Addended by: Georgian Co on: 11/13/2022 04:15 PM   Modules accepted: Orders

## 2022-11-20 ENCOUNTER — Telehealth: Payer: Self-pay

## 2022-11-20 NOTE — Telephone Encounter (Signed)
Patient  called asking for a cream to be called in for her eczema

## 2022-11-23 ENCOUNTER — Ambulatory Visit (INDEPENDENT_AMBULATORY_CARE_PROVIDER_SITE_OTHER): Payer: Self-pay | Admitting: Internal Medicine

## 2022-11-23 ENCOUNTER — Other Ambulatory Visit: Payer: Self-pay

## 2022-11-23 ENCOUNTER — Encounter: Payer: Self-pay | Admitting: Internal Medicine

## 2022-11-23 VITALS — BP 122/80 | HR 75 | Temp 97.9°F | Ht 62.0 in | Wt 187.4 lb

## 2022-11-23 DIAGNOSIS — E119 Type 2 diabetes mellitus without complications: Secondary | ICD-10-CM | POA: Diagnosis not present

## 2022-11-23 DIAGNOSIS — L240 Irritant contact dermatitis due to detergents: Secondary | ICD-10-CM

## 2022-11-23 LAB — GLUCOSE, POCT (MANUAL RESULT ENTRY): POC Glucose: 117 mg/dl — AB (ref 70–99)

## 2022-11-23 MED ORDER — TRIAMCINOLONE ACETONIDE 0.5 % EX OINT: 1.0000 | TOPICAL_OINTMENT | Freq: Two times a day (BID) | CUTANEOUS | 1 refills | Status: DC

## 2022-11-23 MED ORDER — TRIAMCINOLONE ACETONIDE 0.5 % EX OINT
1.0000 | TOPICAL_OINTMENT | Freq: Two times a day (BID) | CUTANEOUS | 1 refills | Status: DC
  Filled 2022-11-23: qty 60, 30d supply, fill #0

## 2022-11-23 MED ORDER — HYDROXYZINE HCL 25 MG PO TABS: 25.0000 mg | ORAL_TABLET | Freq: Three times a day (TID) | ORAL | 0 refills | Status: DC | PRN

## 2022-11-23 MED ORDER — METHYLPREDNISOLONE ACETATE 40 MG/ML IJ SUSP
40.0000 mg | Freq: Once | INTRAMUSCULAR | Status: AC
  Administered 2022-11-23: 40 mg via INTRAMUSCULAR

## 2022-11-23 NOTE — Progress Notes (Signed)
Established Patient Office Visit  Subjective:  Patient ID: Samantha Deleon, female    DOB: 1965-09-02  Age: 57 y.o. MRN: [id number]  Chief Complaint  Patient presents with   Acute Visit    Eczema spreading, itching    C/o generalized rash for two weeks which is pruritic. Changed her laundry detergent with mild improvement.    No other concerns at this time.   Past Medical History:  Diagnosis Date   Asthma    Hypertension     Past Surgical History:  Procedure Laterality Date   COLONOSCOPY N/A 02/27/2022   Procedure: COLONOSCOPY;  Surgeon: Jaynie Collins, DO;  Location: [facility] ENDOSCOPY;  Service: Gastroenterology;  Laterality: N/A;   ESOPHAGOGASTRODUODENOSCOPY N/A 02/27/2022   Procedure: ESOPHAGOGASTRODUODENOSCOPY (EGD);  Surgeon: Jaynie Collins, DO;  Location: [facility] ENDOSCOPY;  Service: Gastroenterology;  Laterality: N/A;   JOINT REPLACEMENT Bilateral    Hips   TUBAL LIGATION      Social History   Socioeconomic History   Marital status: Single    Spouse name: Not on file   Number of children: Not on file   Years of education: Not on file   Highest education level: Not on file  Occupational History   Not on file  Tobacco Use   Smoking status: Never   Smokeless tobacco: Never  Vaping Use   Vaping Use: Never used  Substance and Sexual Activity   Alcohol use: Not Currently    Alcohol/week: 2.0 standard drinks of alcohol    Types: 1 Cans of beer, 1 Standard drinks or equivalent per week   Drug use: No   Sexual activity: Yes  Other Topics Concern   Not on file  Social History Narrative   Not on file   Social Determinants of Health   Financial Resource Strain: Not on file  Food Insecurity: Not on file  Transportation Needs: Not on file  Physical Activity: Not on file  Stress: Not on file  Social Connections: Not on file  Intimate Partner Violence: Not on file    No family history on file.  Allergies  Allergen Reactions   Seasonal Ic  [Cholestatin]     Review of Systems  All other systems reviewed and are negative.      Objective:   BP 122/80   Pulse 75   Temp 97.9 F (36.6 C) (Tympanic)   Ht 5\' 2"  (1.575 m)   Wt 187 lb 6.4 oz (85 kg)   LMP 06/16/2014 (Approximate)   SpO2 97%   BMI 34.28 kg/m   Vitals:   11/23/22 1256  BP: 122/80  Pulse: 75  Temp: 97.9 F (36.6 C)  Height: 5\' 2"  (1.575 m)  Weight: 187 lb 6.4 oz (85 kg)  SpO2: 97%  TempSrc: Tympanic  BMI (Calculated): 34.27    Physical Exam Vitals reviewed.  Constitutional:      General: She is not in acute distress.    Appearance: She is obese.  HENT:     Head: Normocephalic.     Nose: Nose normal.     Mouth/Throat:     Mouth: Mucous membranes are moist.  Eyes:     Extraocular Movements: Extraocular movements intact.     Pupils: Pupils are equal, round, and reactive to light.  Cardiovascular:     Rate and Rhythm: Normal rate and regular rhythm.     Heart sounds: No murmur heard. Pulmonary:     Effort: Pulmonary effort is normal.     Breath sounds:  No rhonchi or rales.  Abdominal:     General: Abdomen is flat.     Palpations: There is no hepatomegaly, splenomegaly or mass.  Musculoskeletal:        General: Normal range of motion.     Cervical back: Normal range of motion. No tenderness.  Skin:    Findings: Rash present. Rash is macular and papular.     Comments: Scratch marks  Neurological:     General: No focal deficit present.     Mental Status: She is alert and oriented to person, place, and time.     Cranial Nerves: No cranial nerve deficit.     Motor: No weakness.  Psychiatric:        Mood and Affect: Mood normal.        Behavior: Behavior normal.      Results for orders placed or performed in visit on 11/23/22  POCT Glucose (CBG)  Result Value Ref Range   POC Glucose 117 (A) 70 - 99 mg/dl    Recent Results (from the past 2160 hour([place]))  POCT Glucose (CBG)     Status: Abnormal   Collection Time: 11/23/22  1:03  PM  Result Value Ref Range   POC Glucose 117 (A) 70 - 99 mg/dl      Assessment & Plan:   Problem List Items Addressed This Visit   None Visit Diagnoses     Type 2 diabetes mellitus without complication, without long-term current use of insulin (HCC)    -  Primary   Relevant Orders   POCT Glucose (CBG) (Completed)   Dermatitis due to detergents       Relevant Medications   triamcinolone ointment (KENALOG) 0.5 %   methylPREDNISolone acetate (DEPO-MEDROL) injection 40 mg       Return in about 5 weeks (around 12/28/2022) for fu with labs prior.   Total time spent: 30 minutes  [name], MD  11/23/2022

## 2022-11-26 ENCOUNTER — Other Ambulatory Visit: Payer: Self-pay

## 2022-12-25 ENCOUNTER — Ambulatory Visit: Payer: 59 | Admitting: Internal Medicine

## 2022-12-28 ENCOUNTER — Other Ambulatory Visit: Payer: Self-pay | Admitting: Internal Medicine

## 2023-01-05 DIAGNOSIS — L439 Lichen planus, unspecified: Secondary | ICD-10-CM | POA: Diagnosis not present

## 2023-01-05 DIAGNOSIS — L308 Other specified dermatitis: Secondary | ICD-10-CM | POA: Diagnosis not present

## 2023-01-05 DIAGNOSIS — R21 Rash and other nonspecific skin eruption: Secondary | ICD-10-CM | POA: Diagnosis not present

## 2023-01-06 ENCOUNTER — Ambulatory Visit: Payer: Medicare HMO | Admitting: Internal Medicine

## 2023-01-09 ENCOUNTER — Other Ambulatory Visit: Payer: Self-pay | Admitting: Internal Medicine

## 2023-01-09 DIAGNOSIS — L240 Irritant contact dermatitis due to detergents: Secondary | ICD-10-CM

## 2023-01-11 ENCOUNTER — Other Ambulatory Visit: Payer: Self-pay | Admitting: Internal Medicine

## 2023-01-12 ENCOUNTER — Other Ambulatory Visit: Payer: Self-pay

## 2023-01-16 ENCOUNTER — Other Ambulatory Visit: Payer: Self-pay | Admitting: Internal Medicine

## 2023-01-19 DIAGNOSIS — L439 Lichen planus, unspecified: Secondary | ICD-10-CM | POA: Diagnosis not present

## 2023-02-01 ENCOUNTER — Ambulatory Visit: Payer: 59 | Admitting: Internal Medicine

## 2023-03-31 ENCOUNTER — Other Ambulatory Visit: Payer: Self-pay | Admitting: Internal Medicine

## 2023-03-31 ENCOUNTER — Other Ambulatory Visit: Payer: Medicaid Other

## 2023-03-31 DIAGNOSIS — J301 Allergic rhinitis due to pollen: Secondary | ICD-10-CM | POA: Diagnosis not present

## 2023-03-31 DIAGNOSIS — E119 Type 2 diabetes mellitus without complications: Secondary | ICD-10-CM | POA: Diagnosis not present

## 2023-03-31 DIAGNOSIS — K219 Gastro-esophageal reflux disease without esophagitis: Secondary | ICD-10-CM | POA: Diagnosis not present

## 2023-03-31 DIAGNOSIS — J4530 Mild persistent asthma, uncomplicated: Secondary | ICD-10-CM | POA: Diagnosis not present

## 2023-04-05 ENCOUNTER — Ambulatory Visit (INDEPENDENT_AMBULATORY_CARE_PROVIDER_SITE_OTHER): Payer: 59 | Admitting: Internal Medicine

## 2023-04-05 VITALS — BP 150/95 | HR 68 | Ht 62.0 in | Wt 181.2 lb

## 2023-04-05 DIAGNOSIS — E119 Type 2 diabetes mellitus without complications: Secondary | ICD-10-CM

## 2023-04-05 DIAGNOSIS — E7800 Pure hypercholesterolemia, unspecified: Secondary | ICD-10-CM

## 2023-04-05 DIAGNOSIS — I10 Essential (primary) hypertension: Secondary | ICD-10-CM | POA: Diagnosis not present

## 2023-04-05 MED ORDER — OLMESARTAN MEDOXOMIL-HCTZ 40-25 MG PO TABS
1.0000 | ORAL_TABLET | Freq: Every morning | ORAL | 3 refills | Status: DC
Start: 2023-04-05 — End: 2023-06-07

## 2023-04-05 MED ORDER — AMLODIPINE BESYLATE 10 MG PO TABS: 10.0000 mg | ORAL_TABLET | Freq: Every morning | ORAL | 1 refills | Status: DC

## 2023-04-05 NOTE — Progress Notes (Signed)
Established Patient Office Visit  Subjective:  Patient ID: Samantha Deleon, female    DOB: 01-Feb-1966  Age: 57 y.o. MRN: [id number]  Chief Complaint  Patient presents with   Follow-up    4 MO F/U    No new complaints, here for lab review and medication refills. Rash has improved after Dermatology management. Reports recurrence of dysphagia. Labs reviewed and notable for well controlled diabetes, A1c at target, lipids also at target. Denies any hypoglycemic episodes and home bg readings have been at target.    No other concerns at this time.   Past Medical History:  Diagnosis Date   Asthma    Hypertension     Past Surgical History:  Procedure Laterality Date   COLONOSCOPY N/A 02/27/2022   Procedure: COLONOSCOPY;  Surgeon: Jaynie Collins, DO;  Location: [facility] ENDOSCOPY;  Service: Gastroenterology;  Laterality: N/A;   ESOPHAGOGASTRODUODENOSCOPY N/A 02/27/2022   Procedure: ESOPHAGOGASTRODUODENOSCOPY (EGD);  Surgeon: Jaynie Collins, DO;  Location: [facility] ENDOSCOPY;  Service: Gastroenterology;  Laterality: N/A;   JOINT REPLACEMENT Bilateral    Hips   TUBAL LIGATION      Social History   Socioeconomic History   Marital status: Single    Spouse name: Not on file   Number of children: Not on file   Years of education: Not on file   Highest education level: Not on file  Occupational History   Not on file  Tobacco Use   Smoking status: Never   Smokeless tobacco: Never  Vaping Use   Vaping status: Never Used  Substance and Sexual Activity   Alcohol use: Not Currently    Alcohol/week: 2.0 standard drinks of alcohol    Types: 1 Cans of beer, 1 Standard drinks or equivalent per week   Drug use: No   Sexual activity: Yes  Other Topics Concern   Not on file  Social History Narrative   Not on file   Social Determinants of Health   Financial Resource Strain: Low Risk  (05/09/2021)   Received from [facility], [facility]   Overall Financial Resource  Strain (CARDIA)    Difficulty of Paying Living Expenses: Not hard at all  Food Insecurity: No Food Insecurity (05/09/2021)   Received from [facility]   Hunger Vital Sign    Worried About Running Out of Food in the Last Year: Never true    Ran Out of Food in the Last Year: Never true  Transportation Needs: No Transportation Needs (05/09/2021)   Received from [facility], [facility]   [facility] - Transportation    Lack of Transportation (Medical): No    Lack of Transportation (Non-Medical): No  Physical Activity: Not on file  Stress: Not on file  Social Connections: Not on file  Intimate Partner Violence: Not on file    No family history on file.  Allergies  Allergen Reactions   Seasonal Ic [Cholestatin]     Review of Systems  All other systems reviewed and are negative.      Objective:   BP (!) 150/95   Pulse 68   Ht 5\' 2"  (1.575 m)   Wt 181 lb 3.2 oz (82.2 kg)   LMP 06/16/2014 (Approximate)   SpO2 98%   BMI 33.14 kg/m   Vitals:   04/05/23 1101  BP: (!) 150/95  Pulse: 68  Height: 5\' 2"  (1.575 m)  Weight: 181 lb 3.2 oz (82.2 kg)  SpO2: 98%  BMI (Calculated):  33.13    Physical Exam Vitals reviewed.  Constitutional:      General: She is not in acute distress.    Appearance: She is obese.  HENT:     Head: Normocephalic.     Nose: Nose normal.     Mouth/Throat:     Mouth: Mucous membranes are moist.  Eyes:     Extraocular Movements: Extraocular movements intact.     Pupils: Pupils are equal, round, and reactive to light.  Cardiovascular:     Rate and Rhythm: Normal rate and regular rhythm.     Heart sounds: No murmur heard. Pulmonary:     Effort: Pulmonary effort is normal.     Breath sounds: No rhonchi or rales.  Abdominal:     General: Abdomen is flat.     Palpations: There is no hepatomegaly, splenomegaly or mass.  Musculoskeletal:        General: Normal range of motion.     Cervical back: Normal range of motion. No  tenderness.  Skin:    Findings: Rash present. Rash is macular and papular.     Comments: Scratch marks  Neurological:     General: No focal deficit present.     Mental Status: She is alert and oriented to person, place, and time.     Cranial Nerves: No cranial nerve deficit.     Motor: No weakness.  Psychiatric:        Mood and Affect: Mood normal.        Behavior: Behavior normal.     No results found for any visits on 04/05/23.  Recent Results (from the past 2160 hour([place]))  Lipid Panel w/o Chol/HDL Ratio     Status: None   Collection Time: 03/31/23 11:17 AM  Result Value Ref Range   Cholesterol, Total 182 100 - 199 mg/dL   Triglycerides 409 0 - 149 mg/dL   HDL 99 >81 mg/dL   VLDL Cholesterol Cal 22 5 - 40 mg/dL   LDL Chol Calc (NIH) 61 0 - 99 mg/dL  Hgb X9J w/o eAG     Status: None   Collection Time: 03/31/23 11:17 AM  Result Value Ref Range   Hgb A1c MFr Bld 5.4 4.8 - 5.6 %    Comment:          Prediabetes: 5.7 - 6.4          Diabetes: >6.4          Glycemic control for adults with diabetes: <7.0       Assessment & Plan:   As per problem list. Stricter low calorie diet, low cholesterol and low fat diet and exercise as much as possible.  Problem List Items Addressed This Visit       Cardiovascular and Mediastinum   Hypertension   Relevant Medications   amLODipine (NORVASC) 10 MG tablet   olmesartan-hydrochlorothiazide (BENICAR HCT) 40-25 MG tablet   Other Relevant Orders   Comprehensive metabolic panel   Other Visit Diagnoses     Type 2 diabetes mellitus without complication, without long-term current use of insulin (HCC)    -  Primary   Relevant Medications   olmesartan-hydrochlorothiazide (BENICAR HCT) 40-25 MG tablet   Other Relevant Orders   Hemoglobin A1c   Lipid panel   Pure hypercholesterolemia       Relevant Medications   amLODipine (NORVASC) 10 MG tablet   olmesartan-hydrochlorothiazide (BENICAR HCT) 40-25 MG tablet   Other Relevant Orders    Lipid panel   CK  Return in about 4 months (around 08/05/2023).   Total time spent: 20 minutes  [name], MD  04/05/2023   This document may have been prepared by [facility] Voice Recognition software and as such may include unintentional dictation errors.

## 2023-05-12 DIAGNOSIS — L439 Lichen planus, unspecified: Secondary | ICD-10-CM | POA: Diagnosis not present

## 2023-05-19 ENCOUNTER — Telehealth: Payer: Self-pay

## 2023-05-19 NOTE — Telephone Encounter (Signed)
Pt called requesting test strips and needles to check her blood- I dont see it in her chart, please advise-HQ

## 2023-05-21 ENCOUNTER — Other Ambulatory Visit: Payer: Self-pay | Admitting: Internal Medicine

## 2023-05-21 ENCOUNTER — Other Ambulatory Visit: Payer: Self-pay

## 2023-05-21 DIAGNOSIS — E119 Type 2 diabetes mellitus without complications: Secondary | ICD-10-CM

## 2023-05-21 MED ORDER — ONETOUCH ULTRA TEST VI STRP: ORAL_STRIP | 12 refills | Status: DC

## 2023-05-21 MED ORDER — ONETOUCH ULTRASOFT 2 LANCETS MISC
1.0000 | Freq: Two times a day (BID) | 11 refills | Status: AC
Start: 2023-05-21 — End: 2024-05-20

## 2023-05-21 MED ORDER — ONETOUCH ULTRA TEST VI STRP
ORAL_STRIP | 12 refills | Status: DC
Start: 2023-05-21 — End: 2024-06-27

## 2023-05-21 MED ORDER — ONETOUCH DELICA LANCING DEV MISC
1.0000 | Freq: Once | 0 refills | Status: AC
Start: 2023-05-21 — End: 2023-05-21

## 2023-05-21 MED ORDER — ONETOUCH ULTRA 2 W/DEVICE KIT
1.0000 | PACK | Freq: Once | 0 refills | Status: AC
Start: 2023-05-21 — End: 2023-05-21

## 2023-06-03 ENCOUNTER — Encounter: Payer: Self-pay | Admitting: Emergency Medicine

## 2023-06-03 ENCOUNTER — Other Ambulatory Visit: Payer: Self-pay

## 2023-06-03 DIAGNOSIS — Z7951 Long term (current) use of inhaled steroids: Secondary | ICD-10-CM | POA: Diagnosis not present

## 2023-06-03 DIAGNOSIS — F10120 Alcohol abuse with intoxication, uncomplicated: Secondary | ICD-10-CM | POA: Diagnosis present

## 2023-06-03 DIAGNOSIS — E876 Hypokalemia: Secondary | ICD-10-CM | POA: Diagnosis present

## 2023-06-03 DIAGNOSIS — Y908 Blood alcohol level of 240 mg/100 ml or more: Secondary | ICD-10-CM | POA: Diagnosis present

## 2023-06-03 DIAGNOSIS — I2510 Atherosclerotic heart disease of native coronary artery without angina pectoris: Secondary | ICD-10-CM | POA: Diagnosis present

## 2023-06-03 DIAGNOSIS — Z7984 Long term (current) use of oral hypoglycemic drugs: Secondary | ICD-10-CM

## 2023-06-03 DIAGNOSIS — E119 Type 2 diabetes mellitus without complications: Secondary | ICD-10-CM | POA: Diagnosis present

## 2023-06-03 DIAGNOSIS — E272 Addisonian crisis: Principal | ICD-10-CM | POA: Diagnosis present

## 2023-06-03 DIAGNOSIS — Z6831 Body mass index (BMI) 31.0-31.9, adult: Secondary | ICD-10-CM

## 2023-06-03 DIAGNOSIS — I9589 Other hypotension: Secondary | ICD-10-CM | POA: Diagnosis present

## 2023-06-03 DIAGNOSIS — I959 Hypotension, unspecified: Secondary | ICD-10-CM | POA: Diagnosis not present

## 2023-06-03 DIAGNOSIS — F32A Depression, unspecified: Secondary | ICD-10-CM | POA: Diagnosis present

## 2023-06-03 DIAGNOSIS — E785 Hyperlipidemia, unspecified: Secondary | ICD-10-CM | POA: Diagnosis present

## 2023-06-03 DIAGNOSIS — Z79899 Other long term (current) drug therapy: Secondary | ICD-10-CM | POA: Diagnosis not present

## 2023-06-03 DIAGNOSIS — I10 Essential (primary) hypertension: Secondary | ICD-10-CM | POA: Diagnosis present

## 2023-06-03 DIAGNOSIS — K219 Gastro-esophageal reflux disease without esophagitis: Secondary | ICD-10-CM | POA: Diagnosis present

## 2023-06-03 DIAGNOSIS — J4489 Other specified chronic obstructive pulmonary disease: Secondary | ICD-10-CM | POA: Diagnosis present

## 2023-06-03 DIAGNOSIS — Z888 Allergy status to other drugs, medicaments and biological substances status: Secondary | ICD-10-CM

## 2023-06-03 DIAGNOSIS — F419 Anxiety disorder, unspecified: Secondary | ICD-10-CM | POA: Diagnosis present

## 2023-06-03 DIAGNOSIS — J4520 Mild intermittent asthma, uncomplicated: Secondary | ICD-10-CM | POA: Diagnosis present

## 2023-06-03 DIAGNOSIS — E669 Obesity, unspecified: Secondary | ICD-10-CM | POA: Diagnosis present

## 2023-06-03 DIAGNOSIS — I272 Pulmonary hypertension, unspecified: Secondary | ICD-10-CM | POA: Diagnosis present

## 2023-06-03 DIAGNOSIS — E872 Acidosis, unspecified: Secondary | ICD-10-CM | POA: Diagnosis present

## 2023-06-03 DIAGNOSIS — R0902 Hypoxemia: Secondary | ICD-10-CM | POA: Diagnosis not present

## 2023-06-03 DIAGNOSIS — N179 Acute kidney failure, unspecified: Secondary | ICD-10-CM | POA: Diagnosis present

## 2023-06-03 DIAGNOSIS — N390 Urinary tract infection, site not specified: Secondary | ICD-10-CM | POA: Diagnosis not present

## 2023-06-03 DIAGNOSIS — Z96643 Presence of artificial hip joint, bilateral: Secondary | ICD-10-CM | POA: Diagnosis present

## 2023-06-03 DIAGNOSIS — J9811 Atelectasis: Secondary | ICD-10-CM | POA: Diagnosis not present

## 2023-06-03 DIAGNOSIS — R55 Syncope and collapse: Secondary | ICD-10-CM | POA: Diagnosis present

## 2023-06-03 LAB — CBC WITH DIFFERENTIAL/PLATELET
Abs Immature Granulocytes: 0.02 10*3/uL (ref 0.00–0.07)
Basophils Absolute: 0.1 10*3/uL (ref 0.0–0.1)
Basophils Relative: 1 %
Eosinophils Absolute: 0.2 10*3/uL (ref 0.0–0.5)
Eosinophils Relative: 3 %
HCT: 36.7 % (ref 36.0–46.0)
Hemoglobin: 12.2 g/dL (ref 12.0–15.0)
Immature Granulocytes: 0 %
Lymphocytes Relative: 32 %
Lymphs Abs: 2.2 10*3/uL (ref 0.7–4.0)
MCH: 28.7 pg (ref 26.0–34.0)
MCHC: 33.2 g/dL (ref 30.0–36.0)
MCV: 86.4 fL (ref 80.0–100.0)
Monocytes Absolute: 0.4 10*3/uL (ref 0.1–1.0)
Monocytes Relative: 6 %
Neutro Abs: 4 10*3/uL (ref 1.7–7.7)
Neutrophils Relative %: 58 %
Platelets: 243 10*3/uL (ref 150–400)
RBC: 4.25 MIL/uL (ref 3.87–5.11)
RDW: 12.6 % (ref 11.5–15.5)
WBC: 6.9 10*3/uL (ref 4.0–10.5)
nRBC: 0 % (ref 0.0–0.2)
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
## 2023-06-04 ENCOUNTER — Emergency Department: Payer: 59

## 2023-06-04 ENCOUNTER — Observation Stay (HOSPITAL_COMMUNITY)
Admit: 2023-06-04 | Discharge: 2023-06-04 | Disposition: A | Discharge status: Home / Self Care | Payer: 59 | Attending: Internal Medicine | Admitting: Internal Medicine

## 2023-06-04 ENCOUNTER — Observation Stay
Admit: 2023-06-04 | Discharge: 2023-06-04 | Disposition: A | Discharge status: Home / Self Care | Payer: 59 | Attending: Internal Medicine | Admitting: Internal Medicine

## 2023-06-04 ENCOUNTER — Inpatient Hospital Stay
Admission: EM | Admit: 2023-06-04 | Discharge: 2023-06-07 | DRG: 644 | Disposition: A | Discharge status: Home / Self Care | Payer: 59 | Attending: Internal Medicine | Admitting: Internal Medicine

## 2023-06-04 DIAGNOSIS — N390 Urinary tract infection, site not specified: Secondary | ICD-10-CM

## 2023-06-04 DIAGNOSIS — R55 Syncope and collapse: Secondary | ICD-10-CM | POA: Diagnosis not present

## 2023-06-04 DIAGNOSIS — E876 Hypokalemia: Secondary | ICD-10-CM

## 2023-06-04 DIAGNOSIS — E119 Type 2 diabetes mellitus without complications: Secondary | ICD-10-CM

## 2023-06-04 DIAGNOSIS — E272 Addisonian crisis: Secondary | ICD-10-CM | POA: Insufficient documentation

## 2023-06-04 DIAGNOSIS — I959 Hypotension, unspecified: Secondary | ICD-10-CM | POA: Diagnosis present

## 2023-06-04 DIAGNOSIS — I10 Essential (primary) hypertension: Secondary | ICD-10-CM | POA: Diagnosis present

## 2023-06-04 DIAGNOSIS — F10929 Alcohol use, unspecified with intoxication, unspecified: Secondary | ICD-10-CM | POA: Insufficient documentation

## 2023-06-04 DIAGNOSIS — E669 Obesity, unspecified: Secondary | ICD-10-CM | POA: Insufficient documentation

## 2023-06-04 DIAGNOSIS — F10920 Alcohol use, unspecified with intoxication, uncomplicated: Secondary | ICD-10-CM

## 2023-06-04 DIAGNOSIS — N179 Acute kidney failure, unspecified: Secondary | ICD-10-CM | POA: Insufficient documentation

## 2023-06-04 LAB — URINALYSIS, ROUTINE W REFLEX MICROSCOPIC
Bilirubin Urine: NEGATIVE
Glucose, UA: NEGATIVE mg/dL
Ketones, ur: NEGATIVE mg/dL
Nitrite: NEGATIVE
Protein, ur: NEGATIVE mg/dL
Specific Gravity, Urine: 1.002 — ABNORMAL LOW (ref 1.005–1.030)
Squamous Epithelial / HPF: 0 /[HPF] (ref 0–5)
pH: 7 (ref 5.0–8.0)

## 2023-06-04 LAB — MAGNESIUM: Magnesium: 2.2 mg/dL (ref 1.7–2.4)

## 2023-06-04 LAB — COMPREHENSIVE METABOLIC PANEL
ALT: 16 U/L (ref 0–44)
AST: 24 U/L (ref 15–41)
Albumin: 4.3 g/dL (ref 3.5–5.0)
Alkaline Phosphatase: 80 U/L (ref 38–126)
Anion gap: 12 (ref 5–15)
BUN: 13 mg/dL (ref 6–20)
CO2: 23 mmol/L (ref 22–32)
Calcium: 8.7 mg/dL — ABNORMAL LOW (ref 8.9–10.3)
Chloride: 104 mmol/L (ref 98–111)
Creatinine, Ser: 0.84 mg/dL (ref 0.44–1.00)
GFR, Estimated: >60 mL/min (ref >60)
Glucose, Bld: 133 mg/dL — ABNORMAL HIGH (ref 70–99)
Potassium: 2.6 mmol/L — CL (ref 3.5–5.1)
Sodium: 139 mmol/L (ref 135–145)
Total Bilirubin: 0.2 mg/dL — ABNORMAL LOW (ref 0.3–1.2)
Total Protein: 7.9 g/dL (ref 6.5–8.1)

## 2023-06-04 LAB — TROPONIN I (HIGH SENSITIVITY)
Troponin I (High Sensitivity): 3 ng/L (ref <18)
Troponin I (High Sensitivity): <2 ng/L (ref <18)

## 2023-06-04 LAB — CORTISOL: Cortisol, Plasma: 7 ug/dL

## 2023-06-04 LAB — TSH: TSH: 0.904 u[IU]/mL (ref 0.350–4.500)

## 2023-06-04 LAB — ETHANOL: Alcohol, Ethyl (B): 324 mg/dL (ref <10)

## 2023-06-04 LAB — HIV ANTIBODY (ROUTINE TESTING W REFLEX): HIV Screen 4th Generation wRfx: NONREACTIVE

## 2023-06-04 LAB — POTASSIUM: Potassium: 3.6 mmol/L (ref 3.5–5.1)

## 2023-06-04 MED ORDER — ENOXAPARIN SODIUM 40 MG/0.4ML IJ SOSY
40.0000 mg | PREFILLED_SYRINGE | INTRAMUSCULAR | Status: DC
  Administered 2023-06-04 – 2023-06-06 (×3): 40 mg via SUBCUTANEOUS
  Filled 2023-06-04 (×3): qty 0.4

## 2023-06-04 MED ORDER — SODIUM CHLORIDE 0.9 % IV SOLN
1.0000 g | Freq: Once | INTRAVENOUS | Status: AC
  Administered 2023-06-04: 1 g via INTRAVENOUS
  Filled 2023-06-04: qty 10

## 2023-06-04 MED ORDER — THIAMINE HCL 100 MG/ML IJ SOLN: 100.0000 mg | Freq: Every day | INTRAMUSCULAR | Status: DC

## 2023-06-04 MED ORDER — HYDROXYZINE HCL 25 MG PO TABS: 25.0000 mg | ORAL_TABLET | Freq: Three times a day (TID) | ORAL | Status: DC | PRN

## 2023-06-04 MED ORDER — MONTELUKAST SODIUM 10 MG PO TABS: 10.0000 mg | ORAL_TABLET | Freq: Every day | ORAL | Status: DC

## 2023-06-04 MED ORDER — POTASSIUM CHLORIDE 10 MEQ/100ML IV SOLN
10.0000 meq | Freq: Once | INTRAVENOUS | Status: AC
  Administered 2023-06-04: 10 meq via INTRAVENOUS
  Filled 2023-06-04: qty 100

## 2023-06-04 MED ORDER — LORAZEPAM 2 MG/ML IJ SOLN: 1.0000 mg | INTRAMUSCULAR | Status: AC | PRN

## 2023-06-04 MED ORDER — SODIUM CHLORIDE 0.9 % IV BOLUS
1000.0000 mL | Freq: Once | INTRAVENOUS | Status: AC
  Administered 2023-06-04: 1000 mL via INTRAVENOUS

## 2023-06-04 MED ORDER — ACETAMINOPHEN 325 MG PO TABS: 650.0000 mg | ORAL_TABLET | Freq: Four times a day (QID) | ORAL | Status: DC | PRN

## 2023-06-04 MED ORDER — MOMETASONE FURO-FORMOTEROL FUM 100-5 MCG/ACT IN AERO
2.0000 | INHALATION_SPRAY | Freq: Two times a day (BID) | RESPIRATORY_TRACT | Status: DC
Start: 2023-06-04 — End: 2023-06-04

## 2023-06-04 MED ORDER — DULOXETINE HCL 20 MG PO CPEP
20.0000 mg | ORAL_CAPSULE | Freq: Every day | ORAL | Status: DC
  Administered 2023-06-05: 20 mg via ORAL
  Filled 2023-06-04: qty 1

## 2023-06-04 MED ORDER — TIZANIDINE HCL 2 MG PO TABS
2.0000 mg | ORAL_TABLET | Freq: Three times a day (TID) | ORAL | Status: DC
  Administered 2023-06-05 – 2023-06-07 (×8): 2 mg via ORAL
  Filled 2023-06-04 (×9): qty 1

## 2023-06-04 MED ORDER — ALBUTEROL SULFATE (2.5 MG/3ML) 0.083% IN NEBU: 3.0000 mL | INHALATION_SOLUTION | Freq: Four times a day (QID) | RESPIRATORY_TRACT | Status: DC | PRN

## 2023-06-04 MED ORDER — LORAZEPAM 1 MG PO TABS
1.0000 mg | ORAL_TABLET | ORAL | Status: AC | PRN
  Administered 2023-06-05: 1 mg via ORAL
  Filled 2023-06-04: qty 1

## 2023-06-04 MED ORDER — ONDANSETRON HCL 4 MG/2ML IJ SOLN
4.0000 mg | Freq: Once | INTRAMUSCULAR | Status: AC
  Administered 2023-06-04: 4 mg via INTRAVENOUS
  Filled 2023-06-04: qty 2

## 2023-06-04 MED ORDER — SODIUM CHLORIDE 0.9 % IV SOLN: INTRAVENOUS | Status: AC

## 2023-06-04 MED ORDER — METFORMIN HCL 500 MG PO TABS
500.0000 mg | ORAL_TABLET | Freq: Two times a day (BID) | ORAL | Status: DC
  Administered 2023-06-05: 500 mg via ORAL
  Filled 2023-06-04: qty 1

## 2023-06-04 MED ORDER — ONDANSETRON HCL 4 MG PO TABS: 4.0000 mg | ORAL_TABLET | Freq: Four times a day (QID) | ORAL | Status: DC | PRN

## 2023-06-04 MED ORDER — PRAZOSIN HCL 1 MG PO CAPS
1.0000 mg | ORAL_CAPSULE | Freq: Every day | ORAL | Status: DC
  Filled 2023-06-04: qty 1

## 2023-06-04 MED ORDER — PANTOPRAZOLE SODIUM 40 MG PO TBEC
40.0000 mg | DELAYED_RELEASE_TABLET | Freq: Every day | ORAL | Status: DC
  Administered 2023-06-04 – 2023-06-07 (×4): 40 mg via ORAL
  Filled 2023-06-04 (×4): qty 1

## 2023-06-04 MED ORDER — CEPHALEXIN 500 MG PO CAPS: 500.0000 mg | ORAL_CAPSULE | Freq: Three times a day (TID) | ORAL | 0 refills | Status: DC

## 2023-06-04 MED ORDER — MIDODRINE HCL 5 MG PO TABS
2.5000 mg | ORAL_TABLET | Freq: Two times a day (BID) | ORAL | Status: DC
  Administered 2023-06-04 – 2023-06-05 (×2): 2.5 mg via ORAL
  Filled 2023-06-04 (×2): qty 1

## 2023-06-04 MED ORDER — ONDANSETRON HCL 4 MG/2ML IJ SOLN: 4.0000 mg | Freq: Four times a day (QID) | INTRAMUSCULAR | Status: DC | PRN

## 2023-06-04 MED ORDER — ACETAMINOPHEN 650 MG RE SUPP: 650.0000 mg | Freq: Four times a day (QID) | RECTAL | Status: DC | PRN

## 2023-06-04 MED ORDER — MIRTAZAPINE 15 MG PO TABS
7.5000 mg | ORAL_TABLET | Freq: Every day | ORAL | Status: DC
  Administered 2023-06-05 – 2023-06-06 (×3): 7.5 mg via ORAL
  Filled 2023-06-04 (×3): qty 1

## 2023-06-04 MED ORDER — BUSPIRONE HCL 5 MG PO TABS
10.0000 mg | ORAL_TABLET | Freq: Three times a day (TID) | ORAL | Status: DC
  Administered 2023-06-05 – 2023-06-07 (×8): 10 mg via ORAL
  Filled 2023-06-04 (×9): qty 2

## 2023-06-04 MED ORDER — THIAMINE MONONITRATE 100 MG PO TABS
100.0000 mg | ORAL_TABLET | Freq: Every day | ORAL | Status: DC
  Administered 2023-06-04 – 2023-06-07 (×4): 100 mg via ORAL
  Filled 2023-06-04 (×4): qty 1

## 2023-06-04 MED ORDER — POTASSIUM CHLORIDE 10 MEQ/100ML IV SOLN
INTRAVENOUS | Status: AC
  Filled 2023-06-04: qty 100

## 2023-06-04 MED ORDER — ATORVASTATIN CALCIUM 10 MG PO TABS
10.0000 mg | ORAL_TABLET | Freq: Every evening | ORAL | Status: DC
  Administered 2023-06-04 – 2023-06-06 (×3): 10 mg via ORAL
  Filled 2023-06-04 (×3): qty 1

## 2023-06-04 MED ORDER — COSYNTROPIN 0.25 MG IJ SOLR
0.2500 mg | Freq: Once | INTRAMUSCULAR | Status: DC
  Filled 2023-06-04: qty 0.25

## 2023-06-04 MED ORDER — FOLIC ACID 1 MG PO TABS
1.0000 mg | ORAL_TABLET | Freq: Every day | ORAL | Status: DC
  Administered 2023-06-04 – 2023-06-07 (×4): 1 mg via ORAL
  Filled 2023-06-04 (×4): qty 1

## 2023-06-04 MED ORDER — SODIUM CHLORIDE 0.9% FLUSH
3.0000 mL | Freq: Two times a day (BID) | INTRAVENOUS | Status: DC
  Administered 2023-06-05 – 2023-06-06 (×5): 3 mL via INTRAVENOUS

## 2023-06-04 MED ORDER — POTASSIUM CHLORIDE 10 MEQ/100ML IV SOLN
10.0000 meq | Freq: Once | INTRAVENOUS | Status: AC
  Administered 2023-06-04: 10 meq via INTRAVENOUS

## 2023-06-04 MED ORDER — ADULT MULTIVITAMIN W/MINERALS CH
1.0000 | ORAL_TABLET | Freq: Every day | ORAL | Status: DC
  Administered 2023-06-04 – 2023-06-07 (×4): 1 via ORAL
  Filled 2023-06-04 (×4): qty 1

## 2023-06-04 NOTE — H&P (Signed)
History and Physical    Samantha Deleon BMW:[id number] DOB: 03-10-1966 DOA: 06/04/2023  PCP: Samantha Monday, MD (Confirm with patient/family/NH records and if not entered, this has to be entered at [facility] point of entry) Patient coming from: Home  I have personally briefly reviewed patient's old medical records in [facility]  Chief Complaint: Feeling weak  HPI: Samantha Deleon is a 57 y.o. female with medical history significant of HTN, IIDM, mild intermittent asthma, anxiety/depression, GERD, presented with syncope.  Reported that recently she been under " tremendous stress in life" and last night patient had a heated discussion with her daughter then she started to drink alcohol and after several drinks she passed out.  She does describe that she felt lightheaded before she lost consciousness.  Episode was brief and she was able to recover her consciousness herself no headache blurry vision or major pains.  2 weeks ago patient started to have intermittent lightheadedness and at 1 time her sister checked her blood pressure and told her that " upper reading only 70s", but patient did not take it seriously and continue to take 2 blood pressure medications.  Otherwise she reported there is no change of her medications.  She denied any nauseous vomiting or diarrhea.  ED Course: Hypotensive SBP 80-90s, tachycardia nonhypoxic.  Blood alcohol level> 300.  Potassium 2.6, creatinine 0.8, CT head negative for acute findings.  Troponins negative x 2, UA showed no UTI.  Patient was given 2 L of IV bolus in the ED however BP remains low.  Review of Systems: As per HPI otherwise 14 point review of systems negative.    Past Medical History:  Diagnosis Date   Asthma    Hypertension     Past Surgical History:  Procedure Laterality Date   COLONOSCOPY N/A 02/27/2022   Procedure: COLONOSCOPY;  Surgeon: Jaynie Collins, DO;  Location: [facility] ENDOSCOPY;  Service: Gastroenterology;  Laterality:  N/A;   ESOPHAGOGASTRODUODENOSCOPY N/A 02/27/2022   Procedure: ESOPHAGOGASTRODUODENOSCOPY (EGD);  Surgeon: Jaynie Collins, DO;  Location: [facility] ENDOSCOPY;  Service: Gastroenterology;  Laterality: N/A;   JOINT REPLACEMENT Bilateral    Hips   TUBAL LIGATION       reports that she has never smoked. She has never used smokeless tobacco. She reports that she does not currently use alcohol after a past usage of about 2.0 standard drinks of alcohol per week. She reports that she does not use drugs.  Allergies  Allergen Reactions   Seasonal Ic [Cholestatin]     History reviewed. No pertinent family history.   Prior to Admission medications   Medication Sig Start Date End Date Taking? Authorizing Provider  cephALEXin (KEFLEX) 500 MG capsule Take 1 capsule (500 mg total) by mouth 3 (three) times daily. 06/04/23  Yes Irean Hong, MD  albuterol (PROVENTIL HFA;VENTOLIN HFA) 108 (90 Base) MCG/ACT inhaler Inhale 2 puffs into the lungs every 6 (six) hours as needed for wheezing or shortness of breath. 06/28/18   Michiel Cowboy A, PA-C  amLODipine (NORVASC) 10 MG tablet Take 1 tablet (10 mg total) by mouth every morning. 04/05/23 07/04/23  Samantha Monday, MD  atorvastatin (LIPITOR) 10 MG tablet TAKE 1 TABLET BY MOUTH EVERY EVENING 01/18/23   Samantha Monday, MD  busPIRone (BUSPAR) 10 MG tablet Take 10 mg by mouth 3 (three) times daily.    [provider]  celecoxib (CELEBREX) 200 MG capsule Take 200 mg by mouth 2 (two) times daily. Patient not taking: Reported  on 04/05/2023    [provider]  DULoxetine (CYMBALTA) 20 MG capsule Take 20 mg by mouth daily.    [provider]  Fluticasone-Salmeterol (ADVAIR DISKUS) 100-50 MCG/DOSE AEPB Inhale 1 puff into the lungs 2 (two) times daily. 06/28/18   Michiel Cowboy A, PA-C  glucose blood (ONETOUCH ULTRA TEST) test strip Use with device to check sugars twice daily 05/21/23   Samantha Monday, MD  hydrOXYzine (ATARAX) 25 MG  tablet Take 1 tablet (25 mg total) by mouth 3 (three) times daily as needed. 11/23/22   Samantha Monday, MD  metFORMIN (GLUCOPHAGE) 500 MG tablet Take by mouth 2 (two) times daily with a meal.    [provider]  metFORMIN (GLUCOPHAGE-XR) 500 MG 24 hr tablet TAKE 1 TABLET BY MOUTH ONCE DAILY IN THE MORNING 12/28/22   Samantha Monday, MD  mirtazapine (REMERON) 7.5 MG tablet Take 7.5 mg by mouth at bedtime.    [provider]  montelukast (SINGULAIR) 10 MG tablet Take 10 mg by mouth at bedtime.    [provider]  olmesartan-hydrochlorothiazide (BENICAR HCT) 40-25 MG tablet Take 1 tablet by mouth every morning. 04/05/23   Samantha Monday, MD  OneTouch UltraSoft 2 Lancets MISC 1 Lancet by Does not apply route 2 (two) times daily. 05/21/23 05/20/24  Samantha Monday, MD  pantoprazole (PROTONIX) 40 MG tablet Take 40 mg by mouth daily.    [provider]  prazosin (MINIPRESS) 1 MG capsule Take 1 mg by mouth at bedtime.    [provider]  tizanidine (ZANAFLEX) 2 MG capsule Take 2 mg by mouth 3 (three) times daily.    [provider]  triamcinolone ointment (KENALOG) 0.5 % APPLY TOPICALLY TO THE AFFECTED AREA TWICE DAILY 01/11/23   Samantha Monday, MD    Physical Exam: Vitals:   06/04/23 1030 06/04/23 1045 06/04/23 1100 06/04/23 1200  BP: (!) 80/48 (!) 81/49  (!) 82/55  Pulse: 96 97  (!) 106  Resp: 12 12 (!) 29 18  Temp:      TempSrc:      SpO2: 97% 97%  93%  Weight:      Height:        Constitutional: NAD, calm, comfortable Vitals:   06/04/23 1030 06/04/23 1045 06/04/23 1100 06/04/23 1200  BP: (!) 80/48 (!) 81/49  (!) 82/55  Pulse: 96 97  (!) 106  Resp: 12 12 (!) 29 18  Temp:      TempSrc:      SpO2: 97% 97%  93%  Weight:      Height:       Eyes: PERRL, lids and conjunctivae normal ENMT: Mucous membranes are dry. Posterior pharynx clear of any exudate or lesions.Normal dentition.  Neck: normal, supple, no masses, no  thyromegaly Respiratory: clear to auscultation bilaterally, no wheezing, no crackles. Normal respiratory effort. No accessory muscle use.  Cardiovascular: Regular rate and rhythm, no murmurs / rubs / gallops. No extremity edema. 2+ pedal pulses. No carotid bruits.  Abdomen: no tenderness, no masses palpated. No hepatosplenomegaly. Bowel sounds positive.  Musculoskeletal: no clubbing / cyanosis. No joint deformity upper and lower extremities. Good ROM, no contractures. Normal muscle tone.  Skin: no rashes, lesions, ulcers. No induration Neurologic: CN 2-12 grossly intact. Sensation intact, DTR normal. Strength 5/5 in all 4.  Psychiatric: Normal judgment and insight. Alert and oriented x 3. Normal mood.     Labs on Admission: I have personally reviewed following labs and imaging  studies  CBC: Recent Labs  Lab 06/03/23 2330  WBC 6.9  NEUTROABS 4.0  HGB 12.2  HCT 36.7  MCV 86.4  PLT 243   Basic Metabolic Panel: Recent Labs  Lab 06/03/23 2330 06/04/23 0248 06/04/23 1213  NA 139  --   --   K 2.6*  --  3.6  CL 104  --   --   CO2 23  --   --   GLUCOSE 133*  --   --   BUN 13  --   --   CREATININE 0.84  --   --   CALCIUM 8.7*  --   --   MG  --  2.2  --    GFR: Estimated Creatinine Clearance: 73.6 mL/min (by C-G formula based on SCr of 0.84 mg/dL). Liver Function Tests: Recent Labs  Lab 06/03/23 2330  AST 24  ALT 16  ALKPHOS 80  BILITOT 0.2*  PROT 7.9  ALBUMIN 4.3   No results for input(s): "LIPASE", "AMYLASE" in the last 168 hours. No results for input(s): "AMMONIA" in the last 168 hours. Coagulation Profile: No results for input(s): "INR", "PROTIME" in the last 168 hours. Cardiac Enzymes: No results for input(s): "CKTOTAL", "CKMB", "CKMBINDEX", "TROPONINI" in the last 168 hours. BNP (last 3 results) No results for input(s): "PROBNP" in the last 8760 hours. HbA1C: No results for input(s): "HGBA1C" in the last 72 hours. CBG: No results for input(s): "GLUCAP" in  the last 168 hours. Lipid Profile: No results for input(s): "CHOL", "HDL", "LDLCALC", "TRIG", "CHOLHDL", "LDLDIRECT" in the last 72 hours. Thyroid Function Tests: Recent Labs    06/04/23 0246  TSH 0.904   Anemia Panel: No results for input(s): "VITAMINB12", "FOLATE", "FERRITIN", "TIBC", "IRON", "RETICCTPCT" in the last 72 hours. Urine analysis:    Component Value Date/Time   COLORURINE COLORLESS (A) 06/03/2023 2330   APPEARANCEUR CLEAR (A) 06/03/2023 2330   APPEARANCEUR CLOUDY 11/28/2013 0958   LABSPEC 1.002 (L) 06/03/2023 2330   LABSPEC 1.025 11/28/2013 0958   PHURINE 7.0 06/03/2023 2330   GLUCOSEU NEGATIVE 06/03/2023 2330   GLUCOSEU see comment 11/28/2013 0958   HGBUR SMALL (A) 06/03/2023 2330   BILIRUBINUR NEGATIVE 06/03/2023 2330   BILIRUBINUR see comment 11/28/2013 0958   KETONESUR NEGATIVE 06/03/2023 2330   PROTEINUR NEGATIVE 06/03/2023 2330   NITRITE NEGATIVE 06/03/2023 2330   LEUKOCYTESUR MODERATE (A) 06/03/2023 2330   LEUKOCYTESUR see comment 11/28/2013 0958    Radiological Exams on Admission: DG Chest 1 View  Result Date: 06/04/2023 CLINICAL DATA:  Hypotension EXAM: CHEST  1 VIEW portable COMPARISON:  08/27/2021 FINDINGS: No consolidation, pneumothorax or effusion. No edema. Normal cardiopericardial silhouette. Overlapping cardiac leads. Degenerative changes seen along the spine. IMPRESSION: No acute cardiopulmonary disease. Electronically Signed   By: Karen Kays M.D.   On: 06/04/2023 11:44   CT Head Wo Contrast  Result Date: 06/04/2023 CLINICAL DATA:  Mental status change of unknown cause. Syncopal episode at home. EXAM: CT HEAD WITHOUT CONTRAST TECHNIQUE: Contiguous axial images were obtained from the base of the skull through the vertex without intravenous contrast. RADIATION DOSE REDUCTION: This exam was performed according to the departmental dose-optimization program which includes automated exposure control, adjustment of the mA and/or kV according to patient  size and/or use of iterative reconstruction technique. COMPARISON:  None Available. FINDINGS: Brain: No evidence of acute infarction, hemorrhage, hydrocephalus, extra-axial collection or mass lesion/mass effect. Vascular: No hyperdense vessel or unexpected calcification. Skull: Normal. Negative for fracture or focal lesion. Sinuses/Orbits: No acute finding.  Other: None. IMPRESSION: No acute intracranial abnormalities. Electronically Signed   By: Burman Nieves M.D.   On: 06/04/2023 03:30    EKG: Independently reviewed.  Sinus rhythm, no acute ST changes, no acute PR or QTc interval changes.  Assessment/Plan Principal Problem:   Near syncope Active Problems:   Alcohol intoxication (HCC)  (please populate well all problems here in Problem List. (For example, if patient is on BP meds at home and you resume or decide to hold them, it is a problem that needs to be her. Same for CAD, COPD, HLD and so on)  Syncope -Likely vasovagal from hypotension and volume contraction as well as alcohol intoxication -Clinically patient still appeared to be volume contracted, will continue maintenance IV fluid -Recheck orthostatic vital signs -Noticed that the patient might already has had episodes of near syncope from baseline borderline hypotension recently.  Plan to hold off her home BP meds and monitor blood pressure recovery. -Other Ddx, severe hypokalemia however EKG and ED telemonitoring showed no significant acute arrhythmia.  Symptomatic hypotension -Still volume contracted, continue IV fluid -Trial midodrine -Will also check TSH and cortisol level -One-time echo  Hypokalemia -K level normalized after IV and p.o. replacement.  Magnesium= 2.2  Alcohol intoxication -Does binge drinking, currently there is no symptoms signs of active withdrawal -Start CIWA protocol with as needed benzos  IIDM -Continue metformin  Anxiety/depression -Continue SSRI  Asthma -No acute concern   DVT  prophylaxis: Lovenox Code Status: Full code Family Communication: None at bedside Disposition Plan: Expect less than 2 midnight hospital stay Consults called: None Admission status: Telemetry observation   Emeline General MD [facility] Pager [phone]  06/04/2023, 2:05 PM

## 2023-06-04 NOTE — ED Notes (Signed)
Pt ambulated to bathroom with steady gate. Nasal Canula removed with spo2 at 94%.

## 2023-06-04 NOTE — ED Provider Notes (Signed)
Physical Exam  BP (!) 82/46   Pulse 95   Temp 98 F (36.7 C)   Resp 11   Ht 5\' 2"  (1.575 m)   Wt 78.9 kg   LMP 06/16/2014 (Approximate)   SpO2 96%   BMI 31.83 kg/m   Physical Exam I have reviewed the vital signs. General:  Awake, alert, no acute distress. Head:  Normocephalic, Atraumatic. EENT:  PERRL, EOMI, Oral mucosa pink and moist, Neck is supple. Cardiovascular: Regular rate, 2+ distal pulses. Respiratory:  Normal respiratory effort, symmetrical expansion, no distress.   Extremities:  Moving all four extremities through full ROM without pain.   Neuro:  Alert and oriented.  Interacting appropriately.   Skin:  Warm, dry, no rash.   Psych: Appropriate affect.   Procedures  .Critical Care  Performed by: Janith Lima, MD Authorized by: Janith Lima, MD   Critical care provider statement:    Critical care time (minutes):  30   Critical care was necessary to treat or prevent imminent or life-threatening deterioration of the following conditions:  Circulatory failure (Persistent profound hypotension)   Critical care was time spent personally by me on the following activities:  Development of treatment plan with patient or surrogate, discussions with consultants, evaluation of patient's response to treatment, examination of patient, ordering and review of laboratory studies, ordering and review of radiographic studies, ordering and performing treatments and interventions, pulse oximetry, re-evaluation of patient's condition and review of old charts   ED Course / MDM   Clinical Course as of 06/04/23 1054  Fri Jun 04, 2023  1610 Patient awakens to voice.  Still little groggy.  Second liter IV fluids infusing.  Patient may be safely discharged home once she is sober and ambulatory with steady gait. [JS]  [id number] Care be transferred to the oncoming provider at change of shift pending sobriety. [JS]  0812 BP(!): 87/42 Lower systolic blood pressures noted.  Has been low throughout  her time here in the emergency department.  Given additional 1 L of fluids.  Patient currently asymptomatic we will continue to monitor at this time. [DW]  1039 Going hypotension noted.  Patient still having intermittent lightheaded symptoms.  Will admit to hospitalist for persistent profound hypotension with ongoing electrolyte repletion. [DW]    Clinical Course User Index [DW] Janith Lima, MD [JS] Irean Hong, MD   Medical Decision Making Amount and/or Complexity of Data Reviewed Labs: ordered. Radiology: ordered.  Risk Prescription drug management. Decision regarding hospitalization.   Patient is a 57 year old female initially presenting overnight for syncopal episode.  Found to have elevated alcohol level and hypotension.  Was given 2 L of fluid as well as potassium repletion.  Laboratory workup notable for possible UTI.  Patient was signed out pending metabolization and reassessment for potential discharge.  I evaluated the patient for several hours in the emergency department with ongoing persistent hypotension with systolics in the 80s and low 90s.  Patient was given additional liter of fluid.  She noted some lightheaded symptoms now that her mental status had improved back to baseline.  She was able to tolerate p.o. intake.  She did have persistent hypotension and now started to notice slight swelling in her hands.  Concern for volume overload with fluids without significant blood pressure response.  At this time, felt admission for ongoing blood pressure monitoring and symptomatic management was warranted.  Hospitalist consulted for admission and has agreed to admit patient for further care.    Anner Crete,  Donetta Potts, MD 06/04/23 1054

## 2023-06-04 NOTE — Progress Notes (Signed)
  Echocardiogram 2D Echocardiogram has been performed.  Samantha Deleon 06/04/2023, 5:06 PM

## 2023-06-04 NOTE — ED Notes (Signed)
Patient's O2 sats sitting around 89%. 2L  supplemental oxygen applied.

## 2023-06-04 NOTE — ED Notes (Signed)
Pt cx of epigastric pain and bilateral hand swelling, ED MD notified, no new orders.

## 2023-06-04 NOTE — Discharge Instructions (Signed)
Drink alcohol only in moderation.  Take and finish antibiotic as prescribed.  Return to the ER for worsening symptoms, persistent vomiting, difficulty breathing or other concerns.   Intensive Outpatient Programs   [facility] [address] [place],  [place], [place] [phone]  [facility] (Inpatient and outpatient)[phone] (Suboxone and Methadone) [address] [phone]  [facility] - Intensive Outpatient [address] [place], [place] [address]  [phone]  [facility] (Outpatient, Inpatient, [facility] (Groups and Residental) (insurance only) [phone] [place], [place] [phone]   [facility] (for caregivers and family) [address] Laurell Josephs [address] [place], [place] [phone]  Residential Treatment Programs  [facility](2 years) Residential: 62 days)[facility] [address] [place], [place], [place] [phone] or [phone]  [facility] [address] [place], [place], [place] [phone]  [facility] ([facility] [address] [place], [place], [place] [phone] Admissions: 8am-3pm M-F  [facility] Program: Residential Program [phone] Days)             [facility]: [facility]  [place], [place], [place]  [phone] or [phone] in Hours over the weekend or by referral)  [facility] [address] [place], [place]  [address] [phone] (Do virtual or phone assessment, offer transportation within 25 miles, have in patient and Outpatient options)   [facility] Crisis: [facility]:[phone] (for crisis response 24 hours a day)
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
## 2023-06-04 NOTE — ED Notes (Signed)
US at bedside

## 2023-06-04 NOTE — ED Notes (Signed)
Pt given crackers and gingerale.

## 2023-06-04 NOTE — ED Notes (Signed)
Pt sitting up eating at this time.

## 2023-06-04 NOTE — ED Notes (Signed)
Report given to Benita Stabile RN

## 2023-06-04 NOTE — ED Notes (Signed)
Pt given warm blanket.

## 2023-06-04 NOTE — ED Notes (Signed)
Pt provided with warm blanket. Pt requested a pack of ice for her hand. Pack of ice given, call light within reach.

## 2023-06-04 NOTE — ED Notes (Signed)
This RN alerted by [facility] EDT that patient's BP was low. This RN repositioned the patient's BP cuff and retook the BP. Patient's BP was 70/53. MD Wells notified.

## 2023-06-05 ENCOUNTER — Inpatient Hospital Stay: Payer: 59

## 2023-06-05 DIAGNOSIS — E876 Hypokalemia: Secondary | ICD-10-CM | POA: Diagnosis present

## 2023-06-05 DIAGNOSIS — E119 Type 2 diabetes mellitus without complications: Secondary | ICD-10-CM | POA: Diagnosis present

## 2023-06-05 DIAGNOSIS — N179 Acute kidney failure, unspecified: Secondary | ICD-10-CM | POA: Insufficient documentation

## 2023-06-05 DIAGNOSIS — Z7951 Long term (current) use of inhaled steroids: Secondary | ICD-10-CM | POA: Diagnosis not present

## 2023-06-05 DIAGNOSIS — I959 Hypotension, unspecified: Secondary | ICD-10-CM | POA: Diagnosis present

## 2023-06-05 DIAGNOSIS — Z888 Allergy status to other drugs, medicaments and biological substances status: Secondary | ICD-10-CM | POA: Diagnosis not present

## 2023-06-05 DIAGNOSIS — Z79899 Other long term (current) drug therapy: Secondary | ICD-10-CM | POA: Diagnosis not present

## 2023-06-05 DIAGNOSIS — Z96643 Presence of artificial hip joint, bilateral: Secondary | ICD-10-CM | POA: Diagnosis present

## 2023-06-05 DIAGNOSIS — E872 Acidosis, unspecified: Secondary | ICD-10-CM | POA: Diagnosis present

## 2023-06-05 DIAGNOSIS — R55 Syncope and collapse: Secondary | ICD-10-CM | POA: Diagnosis present

## 2023-06-05 DIAGNOSIS — E669 Obesity, unspecified: Secondary | ICD-10-CM | POA: Insufficient documentation

## 2023-06-05 DIAGNOSIS — I9589 Other hypotension: Secondary | ICD-10-CM

## 2023-06-05 DIAGNOSIS — Y908 Blood alcohol level of 240 mg/100 ml or more: Secondary | ICD-10-CM | POA: Diagnosis present

## 2023-06-05 DIAGNOSIS — J4520 Mild intermittent asthma, uncomplicated: Secondary | ICD-10-CM | POA: Diagnosis present

## 2023-06-05 DIAGNOSIS — F32A Depression, unspecified: Secondary | ICD-10-CM | POA: Diagnosis present

## 2023-06-05 DIAGNOSIS — I10 Essential (primary) hypertension: Secondary | ICD-10-CM | POA: Diagnosis present

## 2023-06-05 DIAGNOSIS — Z6831 Body mass index (BMI) 31.0-31.9, adult: Secondary | ICD-10-CM | POA: Diagnosis not present

## 2023-06-05 DIAGNOSIS — E785 Hyperlipidemia, unspecified: Secondary | ICD-10-CM | POA: Diagnosis present

## 2023-06-05 DIAGNOSIS — F10120 Alcohol abuse with intoxication, uncomplicated: Secondary | ICD-10-CM | POA: Diagnosis present

## 2023-06-05 DIAGNOSIS — Z7984 Long term (current) use of oral hypoglycemic drugs: Secondary | ICD-10-CM | POA: Diagnosis not present

## 2023-06-05 DIAGNOSIS — I2510 Atherosclerotic heart disease of native coronary artery without angina pectoris: Secondary | ICD-10-CM | POA: Diagnosis present

## 2023-06-05 DIAGNOSIS — J4489 Other specified chronic obstructive pulmonary disease: Secondary | ICD-10-CM | POA: Diagnosis present

## 2023-06-05 DIAGNOSIS — K219 Gastro-esophageal reflux disease without esophagitis: Secondary | ICD-10-CM | POA: Diagnosis present

## 2023-06-05 DIAGNOSIS — I272 Pulmonary hypertension, unspecified: Secondary | ICD-10-CM | POA: Diagnosis present

## 2023-06-05 DIAGNOSIS — F419 Anxiety disorder, unspecified: Secondary | ICD-10-CM | POA: Diagnosis present

## 2023-06-05 DIAGNOSIS — E272 Addisonian crisis: Secondary | ICD-10-CM | POA: Diagnosis present

## 2023-06-05 LAB — CBC
HCT: 31.4 % — ABNORMAL LOW (ref 36.0–46.0)
Hemoglobin: 10.6 g/dL — ABNORMAL LOW (ref 12.0–15.0)
MCH: 28.7 pg (ref 26.0–34.0)
MCHC: 33.8 g/dL (ref 30.0–36.0)
MCV: 85.1 fL (ref 80.0–100.0)
Platelets: 235 10*3/uL (ref 150–400)
RBC: 3.69 MIL/uL — ABNORMAL LOW (ref 3.87–5.11)
RDW: 13 % (ref 11.5–15.5)
WBC: 9.2 10*3/uL (ref 4.0–10.5)
nRBC: 0 % (ref 0.0–0.2)

## 2023-06-05 LAB — BASIC METABOLIC PANEL
Anion gap: 10 (ref 5–15)
Anion gap: 7 (ref 5–15)
BUN: 15 mg/dL (ref 6–20)
BUN: 19 mg/dL (ref 6–20)
CO2: 21 mmol/L — ABNORMAL LOW (ref 22–32)
CO2: 18 mmol/L — ABNORMAL LOW (ref 22–32)
Calcium: 7.9 mg/dL — ABNORMAL LOW (ref 8.9–10.3)
Calcium: 7.5 mg/dL — ABNORMAL LOW (ref 8.9–10.3)
Chloride: 109 mmol/L (ref 98–111)
Chloride: 111 mmol/L (ref 98–111)
Creatinine, Ser: 1.14 mg/dL — ABNORMAL HIGH (ref 0.44–1.00)
Creatinine, Ser: 1.26 mg/dL — ABNORMAL HIGH (ref 0.44–1.00)
GFR, Estimated: 57 mL/min — ABNORMAL LOW (ref >60)
GFR, Estimated: 50 mL/min — ABNORMAL LOW (ref >60)
Glucose, Bld: 98 mg/dL (ref 70–99)
Glucose, Bld: 132 mg/dL — ABNORMAL HIGH (ref 70–99)
Potassium: 3.5 mmol/L (ref 3.5–5.1)
Potassium: 4.4 mmol/L (ref 3.5–5.1)
Sodium: 140 mmol/L (ref 135–145)
Sodium: 136 mmol/L (ref 135–145)

## 2023-06-05 LAB — LACTIC ACID, PLASMA: Lactic Acid, Venous: 1.7 mmol/L (ref 0.5–1.9)

## 2023-06-05 LAB — URINE DRUG SCREEN, QUALITATIVE (ARMC ONLY)
Amphetamines, Ur Screen: NOT DETECTED
Barbiturates, Ur Screen: NOT DETECTED
Benzodiazepine, Ur Scrn: POSITIVE — AB
Cannabinoid 50 Ng, Ur ~~LOC~~: NOT DETECTED
Cocaine Metabolite,Ur ~~LOC~~: NOT DETECTED
MDMA (Ecstasy)Ur Screen: NOT DETECTED
Methadone Scn, Ur: NOT DETECTED
Opiate, Ur Screen: NOT DETECTED
Phencyclidine (PCP) Ur S: NOT DETECTED
Tricyclic, Ur Screen: POSITIVE — AB

## 2023-06-05 LAB — ACTH STIMULATION, 3 TIME POINTS
Cortisol, 30 Min: 15.5 ug/dL
Cortisol, 60 Min: 17.2 ug/dL
Cortisol, Base: 9.4 ug/dL

## 2023-06-05 LAB — GLUCOSE, CAPILLARY: Glucose-Capillary: 92 mg/dL (ref 70–99)

## 2023-06-05 LAB — CORTISOL-AM, BLOOD: Cortisol - AM: 11.5 ug/dL (ref 6.7–22.6)

## 2023-06-05 LAB — PROCALCITONIN: Procalcitonin: <0.1 ng/mL

## 2023-06-05 MED ORDER — COSYNTROPIN 0.25 MG IJ SOLR
0.2500 mg | Freq: Once | INTRAMUSCULAR | Status: AC
  Administered 2023-06-05: 0.25 mg via INTRAVENOUS
  Filled 2023-06-05: qty 0.25

## 2023-06-05 MED ORDER — FLUTICASONE FUROATE-VILANTEROL 200-25 MCG/ACT IN AEPB
1.0000 | INHALATION_SPRAY | Freq: Every day | RESPIRATORY_TRACT | Status: DC
  Administered 2023-06-06: 1 via RESPIRATORY_TRACT
  Filled 2023-06-05: qty 28

## 2023-06-05 MED ORDER — HYDROCORTISONE SOD SUC (PF) 100 MG IJ SOLR
100.0000 mg | Freq: Three times a day (TID) | INTRAMUSCULAR | Status: DC
  Administered 2023-06-05 – 2023-06-07 (×6): 100 mg via INTRAVENOUS
  Filled 2023-06-05 (×7): qty 2

## 2023-06-05 MED ORDER — SODIUM CHLORIDE 0.9 % IV BOLUS
500.0000 mL | Freq: Once | INTRAVENOUS | Status: AC
  Administered 2023-06-05: 500 mL via INTRAVENOUS

## 2023-06-05 MED ORDER — POTASSIUM CHLORIDE CRYS ER 20 MEQ PO TBCR
40.0000 meq | EXTENDED_RELEASE_TABLET | Freq: Once | ORAL | Status: AC
  Administered 2023-06-05: 40 meq via ORAL
  Filled 2023-06-05: qty 2

## 2023-06-05 MED ORDER — MIDODRINE HCL 5 MG PO TABS
5.0000 mg | ORAL_TABLET | Freq: Three times a day (TID) | ORAL | Status: DC
  Administered 2023-06-06: 5 mg via ORAL
  Filled 2023-06-05: qty 1

## 2023-06-05 MED ORDER — MIDODRINE HCL 5 MG PO TABS
10.0000 mg | ORAL_TABLET | Freq: Once | ORAL | Status: AC
  Administered 2023-06-05: 10 mg via ORAL
  Filled 2023-06-05: qty 2

## 2023-06-05 MED ORDER — POTASSIUM CHLORIDE 2 MEQ/ML IV SOLN
INTRAVENOUS | Status: DC
  Filled 2023-06-05: qty 1000

## 2023-06-05 MED ORDER — SODIUM CHLORIDE 0.9 % IV SOLN
2.0000 g | INTRAVENOUS | Status: DC
  Administered 2023-06-06 – 2023-06-07 (×2): 2 g via INTRAVENOUS
  Filled 2023-06-05 (×3): qty 20

## 2023-06-05 MED ORDER — KCL-LACTATED RINGERS 20 MEQ/L IV SOLN
INTRAVENOUS | Status: DC
  Filled 2023-06-05: qty 1000

## 2023-06-05 MED ORDER — SODIUM BICARBONATE 8.4 % IV SOLN
INTRAVENOUS | Status: DC
  Filled 2023-06-05 (×2): qty 1000
  Filled 2023-06-05 (×2): qty 150
  Filled 2023-06-05 (×2): qty 1000

## 2023-06-05 MED ORDER — SODIUM CHLORIDE 0.9 % IV BOLUS
1000.0000 mL | Freq: Once | INTRAVENOUS | Status: AC
  Administered 2023-06-05: 1000 mL via INTRAVENOUS

## 2023-06-05 NOTE — Progress Notes (Addendum)
Progress Note   Patient: Samantha Deleon XBJ:[id number] DOB: 02/11/1966 DOA: 06/04/2023     0 DOS: the patient was seen and examined on 06/05/2023   Brief hospital course:  Samantha Deleon is a 57 y.o. female with medical history significant of HTN, IIDM, mild intermittent asthma, anxiety/depression, GERD, presented with syncope.  Upon arriving the hospital, she is hypotensive, she was given 1 L fluid bolus.  Her potassium 2.6, received potassium. Her cortisol level was borderline low. She developed hypotension again in the morning of 10/5, received repeated fluid bolus.  Also started on hydrocortisone IV.  Due to abnormal urine, urine culture and blood culture sent out, Rocephin started.   Principal Problem:   Near syncope Active Problems:   Alcohol intoxication (HCC)   Obesity (BMI 30-39.9)   Hypokalemia   Hypotension   Assessment and Plan: Syncope secondary to hypotension. Persistent hypotension. Patient blood pressure has been low at home, she was still taking her blood pressure medicines. She has received multiple boluses for hypotension while in the hospital. Cortisol level was borderline low at 7.0, ACTH stimulation test was performed, pending results.  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  Will start hydrocortisone 100 mg every 8 hours. Continue some maintenance fluids. Patient also has moderate white cells in the urine, although she does not have urinary symptoms, this could be potentially the source of hypotension.  Will start Rocephin after the sending urine and blood cultures. Echocardiogram performed, report pending.  Based on my interpretation, patient has a hyperdynamic left ventricle.  1400: Patient so far has received 2 L of fluid bolus again since 7 AM.  Also give 10 mg of midodrine, 100 mg of hydrocortisone.  The third bolus is still holding, if the blood pressure does  not improve, patient may need transfer to ICU for pressor.  Acute kidney injury secondary to hypotension. Hypokalemia. Potassium is better, restart IV fluids with lactated ringer after bolus with added potassium.  Essential hypertension. Hold off all blood pressure medicines.  Obesity with BMI 31.85. Diet exercise.  Type 2 diabetes. Continue current regimen.  Alcohol abuse with alcohol intoxication. Patient states that she has not been doing any alcohol for a month until yesterday, continue to follow.  Asthma. No exacerbation.    Subjective:  Patient still has some dizziness, significant hypotension today.  No chest pain shortness of breath.  Physical Exam: Vitals:   06/05/23 1055 06/05/23 1104 06/05/23 1111 06/05/23 1112  BP: (!) 71/51 (!) 69/46 (!) 77/49 (!) 70/63  Pulse: 65 64 75   Resp:      Temp:      TempSrc:      SpO2: 93% 92% 93%   Weight:      Height:       General exam: Appears calm and comfortable, obese. Respiratory system: Clear to auscultation. Respiratory effort normal. Cardiovascular system: S1 & S2 heard, RRR. No JVD, murmurs, rubs, gallops or clicks. No pedal edema. Gastrointestinal system: Abdomen is nondistended, soft and nontender. No organomegaly or masses felt. Normal bowel sounds heard. Central nervous system: Alert and oriented. No focal neurological deficits. Extremities: Symmetric 5 x 5 power. Skin: No rashes, lesions or ulcers Psychiatry: Judgement and insight appear normal. Mood & affect appropriate.    Data Reviewed:  Chest x-ray and lab results reviewed.  Family Communication: None  Disposition: Status is: Inpatient Remains inpatient appropriate because: Severity of disease, IV treatment, hemodynamically  unstable     Time spent: 55 minutes  Author: Marrion Coy, MD 06/05/2023 11:34 AM  For on call review [email or web address].

## 2023-06-05 NOTE — TOC CM/SW Note (Signed)
TOC consult received for Substance Abuse Resources.  Resources have been added to the patient's AVS. Please re-consult TOC if additional needs arise.   Alfonso Ramus, LCSW Transitions of Care Department [phone]

## 2023-06-05 NOTE — Hospital Course (Signed)
Samantha Deleon is a 58 y.o. female with medical history significant of HTN, IIDM, mild intermittent asthma, anxiety/depression, GERD, presented with syncope.  Upon arriving the hospital, she is hypotensive, she was given 1 L fluid bolus.  Her potassium 2.6, received potassium. Her cortisol level was borderline low. She developed hypotension again in the morning of 10/5, received repeated fluid bolus.  Also started on hydrocortisone IV.  Due to abnormal urine, urine culture and blood culture sent out, Rocephin started.

## 2023-06-05 NOTE — Progress Notes (Signed)
Patient was hypotensive overnight and continued to be hypotensive this morning MD was contacted at approximately 10 am due to patients BP not rebounding after morning medication administered, MD responded and advised interventions. BP is being monitored as interventions are taking place, I will continue to monitor the patient during my shift and advise MD of any other changes in patient status on my shift.

## 2023-06-06 DIAGNOSIS — N179 Acute kidney failure, unspecified: Secondary | ICD-10-CM

## 2023-06-06 DIAGNOSIS — R55 Syncope and collapse: Secondary | ICD-10-CM | POA: Diagnosis not present

## 2023-06-06 DIAGNOSIS — E272 Addisonian crisis: Secondary | ICD-10-CM | POA: Insufficient documentation

## 2023-06-06 LAB — BASIC METABOLIC PANEL
Anion gap: 8 (ref 5–15)
Anion gap: 8 (ref 5–15)
BUN: 20 mg/dL (ref 6–20)
BUN: 18 mg/dL (ref 6–20)
CO2: 21 mmol/L — ABNORMAL LOW (ref 22–32)
CO2: 22 mmol/L (ref 22–32)
Calcium: 7.4 mg/dL — ABNORMAL LOW (ref 8.9–10.3)
Calcium: 7.4 mg/dL — ABNORMAL LOW (ref 8.9–10.3)
Chloride: 108 mmol/L (ref 98–111)
Chloride: 108 mmol/L (ref 98–111)
Creatinine, Ser: 1.17 mg/dL — ABNORMAL HIGH (ref 0.44–1.00)
Creatinine, Ser: 1.05 mg/dL — ABNORMAL HIGH (ref 0.44–1.00)
GFR, Estimated: 55 mL/min — ABNORMAL LOW (ref >60)
GFR, Estimated: >60 mL/min (ref >60)
Glucose, Bld: 135 mg/dL — ABNORMAL HIGH (ref 70–99)
Glucose, Bld: 153 mg/dL — ABNORMAL HIGH (ref 70–99)
Potassium: 4.6 mmol/L (ref 3.5–5.1)
Potassium: 4 mmol/L (ref 3.5–5.1)
Sodium: 137 mmol/L (ref 135–145)
Sodium: 138 mmol/L (ref 135–145)

## 2023-06-06 LAB — CBC
HCT: 32.2 % — ABNORMAL LOW (ref 36.0–46.0)
Hemoglobin: 10.7 g/dL — ABNORMAL LOW (ref 12.0–15.0)
MCH: 28.8 pg (ref 26.0–34.0)
MCHC: 33.2 g/dL (ref 30.0–36.0)
MCV: 86.8 fL (ref 80.0–100.0)
Platelets: 233 10*3/uL (ref 150–400)
RBC: 3.71 MIL/uL — ABNORMAL LOW (ref 3.87–5.11)
RDW: 12.8 % (ref 11.5–15.5)
WBC: 9.8 10*3/uL (ref 4.0–10.5)
nRBC: 0 % (ref 0.0–0.2)

## 2023-06-06 LAB — URINE CULTURE: Culture: <10000 — AB

## 2023-06-06 LAB — MAGNESIUM: Magnesium: 1.9 mg/dL (ref 1.7–2.4)

## 2023-06-06 LAB — GLUCOSE, CAPILLARY: Glucose-Capillary: 116 mg/dL — ABNORMAL HIGH (ref 70–99)

## 2023-06-06 MED ORDER — SODIUM CHLORIDE 0.9 % IV BOLUS
500.0000 mL | Freq: Once | INTRAVENOUS | Status: AC
  Administered 2023-06-06: 500 mL via INTRAVENOUS

## 2023-06-06 MED ORDER — MIDODRINE HCL 5 MG PO TABS
10.0000 mg | ORAL_TABLET | Freq: Once | ORAL | Status: AC
  Administered 2023-06-06: 10 mg via ORAL
  Filled 2023-06-06: qty 2

## 2023-06-06 MED ORDER — MIDODRINE HCL 5 MG PO TABS
10.0000 mg | ORAL_TABLET | Freq: Three times a day (TID) | ORAL | Status: DC
  Administered 2023-06-06 – 2023-06-07 (×2): 10 mg via ORAL
  Filled 2023-06-06 (×2): qty 2

## 2023-06-06 NOTE — Progress Notes (Signed)
Progress Note   Patient: Samantha Deleon ZOX:[id number] DOB: 12-Jul-1966 DOA: 06/04/2023     1 DOS: the patient was seen and examined on 06/06/2023   Brief hospital course:  Samantha Deleon is a 57 y.o. female with medical history significant of HTN, IIDM, mild intermittent asthma, anxiety/depression, GERD, presented with syncope.  Upon arriving the hospital, she is hypotensive, she was given 1 L fluid bolus.  Her potassium 2.6, received potassium. Her cortisol level was borderline low. She developed hypotension again in the morning of 10/5, received repeated fluid bolus.  Also started on hydrocortisone IV.  Due to abnormal urine, urine culture and blood culture sent out, Rocephin started.    Principal Problem:   Near syncope Active Problems:   Hypertension   Diabetes mellitus without complication (HCC)   Alcohol intoxication (HCC)   Obesity (BMI 30-39.9)   Hypokalemia   Hypotension   AKI (acute kidney injury) (HCC)   Adrenal crisis (HCC)   Assessment and Plan: Syncope secondary to hypotension. Persistent hypotension Patient blood pressure has been low at home, she was still taking her blood pressure medicines. She has received multiple boluses for hypotension while in the hospital. Cortisol level was borderline low at 7.0, ACTH stimulation test was performed, rising of cortisol level inappropriate.  Patient has adrenal insufficiency with adrenal crisis.  Patient has been started on hydrocortisone 100 mg every 8 hours IV.  Continue some maintenance fluids. Patient also has moderate white cells in the urine, although she does not have urinary symptoms, this could be potentially the source of hypotension.  Started Rocephin.  Blood culture so far negative, urine culture pending. Echocardiogram performed, report pending.  Based on my interpretation, patient has a hyperdynamic left ventricle.  Patient blood pressure has been stable since last night, but blood pressure is lower again  today, will increase the dose of midodrine to 10 mg 3 times a day.  Will give as needed bolus.  Continue IV fluids.   Acute kidney injury secondary to hypotension. Hypokalemia. Metabolic acidosis. Potassium has normalized.  Patient was given sodium bicarb, condition improving.  Renal function also improving.   Essential hypertension. Hold off all blood pressure medicines.   Obesity with BMI 31.85. Diet exercise.   Type 2 diabetes. Continue current regimen.   Alcohol abuse with alcohol intoxication. Patient states that she has not been doing any alcohol for a month until yesterday, continue to follow.   Asthma. No exacerbation.      Subjective:  Patient feel much better today.  No shortness of breath.  Physical Exam: Vitals:   06/06/23 0410 06/06/23 0736 06/06/23 1405 06/06/23 1415  BP: (!) 109/53 (!) 105/58 (!) 89/50 (!) 91/52  Pulse: 80 89 82 77  Resp: 18 18    Temp: 98 F (36.7 C) 98.1 F (36.7 C) 98.1 F (36.7 C)   TempSrc: Oral Oral Oral   SpO2: 98% 100% 98% 95%  Weight:      Height:       General exam: Appears calm and comfortable  Respiratory system: Clear to auscultation. Respiratory effort normal. Cardiovascular system: S1 & S2 heard, RRR. No JVD, murmurs, rubs, gallops or clicks. No pedal edema. Gastrointestinal system: Abdomen is nondistended, soft and nontender. No organomegaly or masses felt. Normal bowel sounds heard. Central nervous system: Alert and oriented. No focal neurological deficits. Extremities: Symmetric 5 x 5 power. Skin: No rashes, lesions or ulcers Psychiatry: Judgement and insight appear normal. Mood & affect appropriate.    Data  Reviewed:  Lab results reviewed.  Family Communication: None  Disposition: Status is: Inpatient Remains inpatient appropriate because: Severity of disease, IV treatment.     Time spent: 35 minutes  Author: Marrion Coy, MD 06/06/2023 2:24 PM  For on call review [email or web address].

## 2023-06-06 NOTE — Progress Notes (Signed)
Password placed in chart. Patient will update family members with password.   Madie Reno, RN

## 2023-06-07 DIAGNOSIS — E272 Addisonian crisis: Secondary | ICD-10-CM | POA: Diagnosis not present

## 2023-06-07 DIAGNOSIS — N179 Acute kidney failure, unspecified: Secondary | ICD-10-CM | POA: Diagnosis not present

## 2023-06-07 DIAGNOSIS — R55 Syncope and collapse: Secondary | ICD-10-CM | POA: Diagnosis not present

## 2023-06-07 LAB — ECHOCARDIOGRAM COMPLETE
AR max vel: 3.06 cm2
AV Area VTI: 2.97 cm2
AV Area mean vel: 2.88 cm2
AV Mean grad: 8 mm[Hg]
AV Peak grad: 14.9 mm[Hg]
Ao pk vel: 1.93 m/s
Area-P 1/2: 5.16 cm2
MV VTI: 4.34 cm2
S' Lateral: 3.1 cm

## 2023-06-07 LAB — BASIC METABOLIC PANEL
Anion gap: 7 (ref 5–15)
BUN: 16 mg/dL (ref 6–20)
CO2: 26 mmol/L (ref 22–32)
Calcium: 7.7 mg/dL — ABNORMAL LOW (ref 8.9–10.3)
Chloride: 108 mmol/L (ref 98–111)
Creatinine, Ser: 0.89 mg/dL (ref 0.44–1.00)
GFR, Estimated: >60 mL/min (ref >60)
Glucose, Bld: 124 mg/dL — ABNORMAL HIGH (ref 70–99)
Potassium: 4 mmol/L (ref 3.5–5.1)
Sodium: 141 mmol/L (ref 135–145)

## 2023-06-07 LAB — CBC
HCT: 33.3 % — ABNORMAL LOW (ref 36.0–46.0)
Hemoglobin: 11.2 g/dL — ABNORMAL LOW (ref 12.0–15.0)
MCH: 28.8 pg (ref 26.0–34.0)
MCHC: 33.6 g/dL (ref 30.0–36.0)
MCV: 85.6 fL (ref 80.0–100.0)
Platelets: 261 10*3/uL (ref 150–400)
RBC: 3.89 MIL/uL (ref 3.87–5.11)
RDW: 12.8 % (ref 11.5–15.5)
WBC: 13.5 10*3/uL — ABNORMAL HIGH (ref 4.0–10.5)
nRBC: 0 % (ref 0.0–0.2)

## 2023-06-07 LAB — ACTH: C206 ACTH: 25.6 pg/mL (ref 7.2–63.3)

## 2023-06-07 LAB — MAGNESIUM: Magnesium: 2.1 mg/dL (ref 1.7–2.4)

## 2023-06-07 MED ORDER — MIDODRINE HCL 5 MG PO TABS: 5.0000 mg | ORAL_TABLET | Freq: Three times a day (TID) | ORAL | 0 refills | Status: DC

## 2023-06-07 MED ORDER — PREDNISONE 10 MG PO TABS
5.0000 mg | ORAL_TABLET | Freq: Every day | ORAL | Status: DC
  Administered 2023-06-07: 5 mg via ORAL
  Filled 2023-06-07: qty 1

## 2023-06-07 MED ORDER — PREDNISONE 5 MG PO TABS: 5.0000 mg | ORAL_TABLET | Freq: Every day | ORAL | 0 refills | Status: DC

## 2023-06-07 MED ORDER — MIDODRINE HCL 5 MG PO TABS: 5.0000 mg | ORAL_TABLET | Freq: Three times a day (TID) | ORAL | Status: DC

## 2023-06-07 NOTE — Discharge Summary (Signed)
edema. Gastrointestinal system: Abdomen is nondistended, soft and nontender. No organomegaly or masses felt. Normal bowel sounds heard. Central nervous system: Alert and oriented. No focal neurological deficits. Extremities: Symmetric 5 x 5 power. Skin: No rashes, lesions or ulcers Psychiatry: Judgement and insight appear normal. Mood & affect appropriate.    Condition at discharge: good  The results of significant diagnostics from this hospitalization (including imaging, microbiology, ancillary and laboratory) are listed below for reference.   Imaging Studies: ECHOCARDIOGRAM COMPLETE  Result Date: 06/07/2023    ECHOCARDIOGRAM REPORT   Patient Name:   Samantha Deleon Date of Exam: 06/04/2023 Medical Rec #:  [id number]       Height:       62.0 in Accession #:    [id number]      Weight:       174.0 lb Date of Birth:  1965/12/17       BSA:          1.802 m  Patient Age:    57 years        BP:           82/55 mmHg Patient Gender: F               HR:           105 bpm. Exam Location:  [facility] Procedure: 2D Echo, Cardiac Doppler, Color Doppler and Strain Analysis Indications:     Syncope R55  History:         Patient has no prior history of Echocardiogram examinations.                  Risk Factors:Hypertension.  Sonographer:     Neysa Bonito Roar Referring Phys:  [id number] Em[facility] General Diagnosing Phys: Chilton Si MD  Sonographer Comments: Global longitudinal strain was attempted. IMPRESSIONS  1. Left ventricular ejection fraction, by estimation, is 60 to 65%. The left ventricle has normal function. The left ventricle has no regional wall motion abnormalities. Left ventricular diastolic parameters were normal. The average left ventricular global longitudinal strain is -21.9 %. The global longitudinal strain is normal.  2. Right ventricular systolic function is normal. The right ventricular size is normal. There is mildly elevated pulmonary artery systolic pressure.  3. Left atrial size was mildly dilated.  4. The mitral valve is normal in structure. No evidence of mitral valve regurgitation. No evidence of mitral stenosis.  5. The aortic valve is tricuspid. Aortic valve regurgitation is not visualized. No aortic stenosis is present.  6. The inferior vena cava is dilated in size with >50% respiratory variability, suggesting right atrial pressure of 8 mmHg. FINDINGS  Left Ventricle: Left ventricular ejection fraction, by estimation, is 60 to 65%. The left ventricle has normal function. The left ventricle has no regional wall motion abnormalities. The average left ventricular global longitudinal strain is -21.9 %. The global longitudinal strain is normal. The left ventricular internal cavity size was normal in size. There is no left ventricular hypertrophy. Left ventricular diastolic parameters were normal. Normal left ventricular filling pressure. Right Ventricle: The right  ventricular size is normal. No increase in right ventricular wall thickness. Right ventricular systolic function is normal. There is mildly elevated pulmonary artery systolic pressure. The tricuspid regurgitant velocity is 2.87  m/s, and with an assumed right atrial pressure of 8 mmHg, the estimated right ventricular systolic pressure is 40.9 mmHg. Left Atrium: Left atrial size was mildly dilated. Right Atrium: Right atrial size was normal in size. Pericardium: There is  edema. Gastrointestinal system: Abdomen is nondistended, soft and nontender. No organomegaly or masses felt. Normal bowel sounds heard. Central nervous system: Alert and oriented. No focal neurological deficits. Extremities: Symmetric 5 x 5 power. Skin: No rashes, lesions or ulcers Psychiatry: Judgement and insight appear normal. Mood & affect appropriate.    Condition at discharge: good  The results of significant diagnostics from this hospitalization (including imaging, microbiology, ancillary and laboratory) are listed below for reference.   Imaging Studies: ECHOCARDIOGRAM COMPLETE  Result Date: 06/07/2023    ECHOCARDIOGRAM REPORT   Patient Name:   Samantha Deleon Date of Exam: 06/04/2023 Medical Rec #:  [id number]       Height:       62.0 in Accession #:    [id number]      Weight:       174.0 lb Date of Birth:  1965/12/17       BSA:          1.802 m  Patient Age:    57 years        BP:           82/55 mmHg Patient Gender: F               HR:           105 bpm. Exam Location:  [facility] Procedure: 2D Echo, Cardiac Doppler, Color Doppler and Strain Analysis Indications:     Syncope R55  History:         Patient has no prior history of Echocardiogram examinations.                  Risk Factors:Hypertension.  Sonographer:     Neysa Bonito Roar Referring Phys:  [id number] Emeline General Diagnosing Phys: Chilton Si MD  Sonographer Comments: Global longitudinal strain was attempted. IMPRESSIONS  1. Left ventricular ejection fraction, by estimation, is 60 to 65%. The left ventricle has normal function. The left ventricle has no regional wall motion abnormalities. Left ventricular diastolic parameters were normal. The average left ventricular global longitudinal strain is -21.9 %. The global longitudinal strain is normal.  2. Right ventricular systolic function is normal. The right ventricular size is normal. There is mildly elevated pulmonary artery systolic pressure.  3. Left atrial size was mildly dilated.  4. The mitral valve is normal in structure. No evidence of mitral valve regurgitation. No evidence of mitral stenosis.  5. The aortic valve is tricuspid. Aortic valve regurgitation is not visualized. No aortic stenosis is present.  6. The inferior vena cava is dilated in size with >50% respiratory variability, suggesting right atrial pressure of 8 mmHg. FINDINGS  Left Ventricle: Left ventricular ejection fraction, by estimation, is 60 to 65%. The left ventricle has normal function. The left ventricle has no regional wall motion abnormalities. The average left ventricular global longitudinal strain is -21.9 %. The global longitudinal strain is normal. The left ventricular internal cavity size was normal in size. There is no left ventricular hypertrophy. Left ventricular diastolic parameters were normal. Normal left ventricular filling pressure. Right Ventricle: The right  ventricular size is normal. No increase in right ventricular wall thickness. Right ventricular systolic function is normal. There is mildly elevated pulmonary artery systolic pressure. The tricuspid regurgitant velocity is 2.87  m/s, and with an assumed right atrial pressure of 8 mmHg, the estimated right ventricular systolic pressure is 40.9 mmHg. Left Atrium: Left atrial size was mildly dilated. Right Atrium: Right atrial size was normal in size. Pericardium: There is  edema. Gastrointestinal system: Abdomen is nondistended, soft and nontender. No organomegaly or masses felt. Normal bowel sounds heard. Central nervous system: Alert and oriented. No focal neurological deficits. Extremities: Symmetric 5 x 5 power. Skin: No rashes, lesions or ulcers Psychiatry: Judgement and insight appear normal. Mood & affect appropriate.    Condition at discharge: good  The results of significant diagnostics from this hospitalization (including imaging, microbiology, ancillary and laboratory) are listed below for reference.   Imaging Studies: ECHOCARDIOGRAM COMPLETE  Result Date: 06/07/2023    ECHOCARDIOGRAM REPORT   Patient Name:   Samantha Deleon Date of Exam: 06/04/2023 Medical Rec #:  [id number]       Height:       62.0 in Accession #:    [id number]      Weight:       174.0 lb Date of Birth:  1965/12/17       BSA:          1.802 m  Patient Age:    57 years        BP:           82/55 mmHg Patient Gender: F               HR:           105 bpm. Exam Location:  [facility] Procedure: 2D Echo, Cardiac Doppler, Color Doppler and Strain Analysis Indications:     Syncope R55  History:         Patient has no prior history of Echocardiogram examinations.                  Risk Factors:Hypertension.  Sonographer:     Neysa Bonito Roar Referring Phys:  [id number] Emeline General Diagnosing Phys: Chilton Si MD  Sonographer Comments: Global longitudinal strain was attempted. IMPRESSIONS  1. Left ventricular ejection fraction, by estimation, is 60 to 65%. The left ventricle has normal function. The left ventricle has no regional wall motion abnormalities. Left ventricular diastolic parameters were normal. The average left ventricular global longitudinal strain is -21.9 %. The global longitudinal strain is normal.  2. Right ventricular systolic function is normal. The right ventricular size is normal. There is mildly elevated pulmonary artery systolic pressure.  3. Left atrial size was mildly dilated.  4. The mitral valve is normal in structure. No evidence of mitral valve regurgitation. No evidence of mitral stenosis.  5. The aortic valve is tricuspid. Aortic valve regurgitation is not visualized. No aortic stenosis is present.  6. The inferior vena cava is dilated in size with >50% respiratory variability, suggesting right atrial pressure of 8 mmHg. FINDINGS  Left Ventricle: Left ventricular ejection fraction, by estimation, is 60 to 65%. The left ventricle has normal function. The left ventricle has no regional wall motion abnormalities. The average left ventricular global longitudinal strain is -21.9 %. The global longitudinal strain is normal. The left ventricular internal cavity size was normal in size. There is no left ventricular hypertrophy. Left ventricular diastolic parameters were normal. Normal left ventricular filling pressure. Right Ventricle: The right  ventricular size is normal. No increase in right ventricular wall thickness. Right ventricular systolic function is normal. There is mildly elevated pulmonary artery systolic pressure. The tricuspid regurgitant velocity is 2.87  m/s, and with an assumed right atrial pressure of 8 mmHg, the estimated right ventricular systolic pressure is 40.9 mmHg. Left Atrium: Left atrial size was mildly dilated. Right Atrium: Right atrial size was normal in size. Pericardium: There is  Physician Discharge Summary   Patient: Samantha Deleon MRN: [id number] DOB: [birth year]  Admit date:     06/04/2023  Discharge date: 06/07/23  Discharge Physician: Marrion Coy   PCP: Sherron Monday, MD   Recommendations at discharge:   Follow-up with PCP in 1 week. May discontinue midodrine when blood pressures are running high. Refer to endocrinologist.  Discharge Diagnoses: Principal Problem:   Near syncope Active Problems:   Hypertension   Diabetes mellitus without complication (HCC)   Alcohol intoxication (HCC)   Obesity (BMI 30-39.9)   Hypokalemia   Hypotension   AKI (acute kidney injury) (HCC)   Adrenal crisis (HCC)  Resolved Problems:   * No resolved hospital problems. *  Hospital Course:  Samantha Deleon is a 57 y.o. female with medical history significant of HTN, IIDM, mild intermittent asthma, anxiety/depression, GERD, presented with syncope.  Upon arriving the hospital, she is hypotensive, she was given 1 L fluid bolus.  Her potassium 2.6, received potassium. Her cortisol level was borderline low. She developed hypotension again in the morning of 10/5, received repeated fluid bolus.  Also started on hydrocortisone IV.  Due to abnormal urine, urine culture and blood culture sent out, Rocephin started. Patient was also taking higher dose of midodrine to 10 mg 3 times a day.  Urine culture had insignificant growth.  However, patient has completed 3 days of Rocephin.  No additional antibiotics needed. Her blood pressure finally improved, renal function has normalized.  At this point she is medically stable to be discharged.  Assessment and Plan: Syncope secondary to hypotension. Persistent hypotension secondary to adrenal crisis. Patient blood pressure has been low at home, she was still taking her blood pressure medicines. She has received multiple boluses for hypotension while in the hospital. Cortisol level was borderline low at 7.0, ACTH stimulation test was  performed, rising of cortisol level inappropriate.  Patient has adrenal insufficiency with adrenal crisis.  Patient has been started on hydrocortisone 100 mg every 8 hours IV.   Patient also has moderate white cells in the urine, although she does not have urinary symptoms, this could be potentially the source of hypotension.  Started Rocephin.  Blood culture so far negative, urine culture insignificant growth, no need for additional antibiotics. Echocardiogram performed, ejection fraction 60 to 65% with mild pulm hypertension. After about his treatment, patient condition finally improved.  I will reduce her midodrine dose to 5 mg 3 times a day, switch to prednisone 5 mg daily.  Patient be followed by PCP as outpatient.   Acute kidney injury secondary to hypotension. Hypokalemia. Metabolic acidosis. Condition all improved.   Essential hypertension. Blood pressure medicine discontinued.   Obesity with BMI 31.85. Diet exercise.   Type 2 diabetes. Continue current regimen.   Alcohol abuse with alcohol intoxication. Patient has not been drinking alcohol prior to hospitalization until the day prior, no alcohol withdrawal.   Asthma. No exacerbation.          Consultants: None Procedures performed: None  Disposition: Home Diet recommendation:  Discharge Diet Orders (From admission, onward)     Start     Ordered   06/07/23 0000  Diet - low sodium heart healthy        06/07/23 1236           Cardiac diet DISCHARGE MEDICATION: Allergies as of 06/07/2023       Reactions   Seasonal Ic [cholestatin]         Medication List  Physician Discharge Summary   Patient: Samantha Deleon MRN: [id number] DOB: [birth year]  Admit date:     06/04/2023  Discharge date: 06/07/23  Discharge Physician: Marrion Coy   PCP: Sherron Monday, MD   Recommendations at discharge:   Follow-up with PCP in 1 week. May discontinue midodrine when blood pressures are running high. Refer to endocrinologist.  Discharge Diagnoses: Principal Problem:   Near syncope Active Problems:   Hypertension   Diabetes mellitus without complication (HCC)   Alcohol intoxication (HCC)   Obesity (BMI 30-39.9)   Hypokalemia   Hypotension   AKI (acute kidney injury) (HCC)   Adrenal crisis (HCC)  Resolved Problems:   * No resolved hospital problems. *  Hospital Course:  Samantha Deleon is a 57 y.o. female with medical history significant of HTN, IIDM, mild intermittent asthma, anxiety/depression, GERD, presented with syncope.  Upon arriving the hospital, she is hypotensive, she was given 1 L fluid bolus.  Her potassium 2.6, received potassium. Her cortisol level was borderline low. She developed hypotension again in the morning of 10/5, received repeated fluid bolus.  Also started on hydrocortisone IV.  Due to abnormal urine, urine culture and blood culture sent out, Rocephin started. Patient was also taking higher dose of midodrine to 10 mg 3 times a day.  Urine culture had insignificant growth.  However, patient has completed 3 days of Rocephin.  No additional antibiotics needed. Her blood pressure finally improved, renal function has normalized.  At this point she is medically stable to be discharged.  Assessment and Plan: Syncope secondary to hypotension. Persistent hypotension secondary to adrenal crisis. Patient blood pressure has been low at home, she was still taking her blood pressure medicines. She has received multiple boluses for hypotension while in the hospital. Cortisol level was borderline low at 7.0, ACTH stimulation test was  performed, rising of cortisol level inappropriate.  Patient has adrenal insufficiency with adrenal crisis.  Patient has been started on hydrocortisone 100 mg every 8 hours IV.   Patient also has moderate white cells in the urine, although she does not have urinary symptoms, this could be potentially the source of hypotension.  Started Rocephin.  Blood culture so far negative, urine culture insignificant growth, no need for additional antibiotics. Echocardiogram performed, ejection fraction 60 to 65% with mild pulm hypertension. After about his treatment, patient condition finally improved.  I will reduce her midodrine dose to 5 mg 3 times a day, switch to prednisone 5 mg daily.  Patient be followed by PCP as outpatient.   Acute kidney injury secondary to hypotension. Hypokalemia. Metabolic acidosis. Condition all improved.   Essential hypertension. Blood pressure medicine discontinued.   Obesity with BMI 31.85. Diet exercise.   Type 2 diabetes. Continue current regimen.   Alcohol abuse with alcohol intoxication. Patient has not been drinking alcohol prior to hospitalization until the day prior, no alcohol withdrawal.   Asthma. No exacerbation.          Consultants: None Procedures performed: None  Disposition: Home Diet recommendation:  Discharge Diet Orders (From admission, onward)     Start     Ordered   06/07/23 0000  Diet - low sodium heart healthy        06/07/23 1236           Cardiac diet DISCHARGE MEDICATION: Allergies as of 06/07/2023       Reactions   Seasonal Ic [cholestatin]         Medication List  Physician Discharge Summary   Patient: Samantha Deleon MRN: [id number] DOB: [birth year]  Admit date:     06/04/2023  Discharge date: 06/07/23  Discharge Physician: Marrion Coy   PCP: Sherron Monday, MD   Recommendations at discharge:   Follow-up with PCP in 1 week. May discontinue midodrine when blood pressures are running high. Refer to endocrinologist.  Discharge Diagnoses: Principal Problem:   Near syncope Active Problems:   Hypertension   Diabetes mellitus without complication (HCC)   Alcohol intoxication (HCC)   Obesity (BMI 30-39.9)   Hypokalemia   Hypotension   AKI (acute kidney injury) (HCC)   Adrenal crisis (HCC)  Resolved Problems:   * No resolved hospital problems. *  Hospital Course:  Samantha Deleon is a 57 y.o. female with medical history significant of HTN, IIDM, mild intermittent asthma, anxiety/depression, GERD, presented with syncope.  Upon arriving the hospital, she is hypotensive, she was given 1 L fluid bolus.  Her potassium 2.6, received potassium. Her cortisol level was borderline low. She developed hypotension again in the morning of 10/5, received repeated fluid bolus.  Also started on hydrocortisone IV.  Due to abnormal urine, urine culture and blood culture sent out, Rocephin started. Patient was also taking higher dose of midodrine to 10 mg 3 times a day.  Urine culture had insignificant growth.  However, patient has completed 3 days of Rocephin.  No additional antibiotics needed. Her blood pressure finally improved, renal function has normalized.  At this point she is medically stable to be discharged.  Assessment and Plan: Syncope secondary to hypotension. Persistent hypotension secondary to adrenal crisis. Patient blood pressure has been low at home, she was still taking her blood pressure medicines. She has received multiple boluses for hypotension while in the hospital. Cortisol level was borderline low at 7.0, ACTH stimulation test was  performed, rising of cortisol level inappropriate.  Patient has adrenal insufficiency with adrenal crisis.  Patient has been started on hydrocortisone 100 mg every 8 hours IV.   Patient also has moderate white cells in the urine, although she does not have urinary symptoms, this could be potentially the source of hypotension.  Started Rocephin.  Blood culture so far negative, urine culture insignificant growth, no need for additional antibiotics. Echocardiogram performed, ejection fraction 60 to 65% with mild pulm hypertension. After about his treatment, patient condition finally improved.  I will reduce her midodrine dose to 5 mg 3 times a day, switch to prednisone 5 mg daily.  Patient be followed by PCP as outpatient.   Acute kidney injury secondary to hypotension. Hypokalemia. Metabolic acidosis. Condition all improved.   Essential hypertension. Blood pressure medicine discontinued.   Obesity with BMI 31.85. Diet exercise.   Type 2 diabetes. Continue current regimen.   Alcohol abuse with alcohol intoxication. Patient has not been drinking alcohol prior to hospitalization until the day prior, no alcohol withdrawal.   Asthma. No exacerbation.          Consultants: None Procedures performed: None  Disposition: Home Diet recommendation:  Discharge Diet Orders (From admission, onward)     Start     Ordered   06/07/23 0000  Diet - low sodium heart healthy        06/07/23 1236           Cardiac diet DISCHARGE MEDICATION: Allergies as of 06/07/2023       Reactions   Seasonal Ic [cholestatin]         Medication List

## 2023-06-07 NOTE — Plan of Care (Signed)
  Problem: Education: Goal: Knowledge of condition and prescribed therapy will improve Outcome: Progressing   Problem: Cardiac: Goal: Will achieve and/or maintain adequate cardiac output Outcome: Progressing   Problem: Physical Regulation: Goal: Complications related to the disease process, condition or treatment will be avoided or minimized Outcome: Progressing   Problem: Education: Goal: Knowledge of General Education information will improve Description: Including pain rating scale, medication(s)/side effects and non-pharmacologic comfort measures Outcome: Progressing   Problem: Health Behavior/Discharge Planning: Goal: Ability to manage health-related needs will improve Outcome: Progressing   

## 2023-06-07 NOTE — TOC CM/SW Note (Signed)
Transition of Care [facility]) - Inpatient Brief Assessment   Patient Details  Name: CODY OLIGER MRN: [id number] Date of Birth: 1965-10-10  Transition of Care [facility]) CM/SW Contact:    Chapman Fitch, RN Phone Number: 06/07/2023, 9:33 AM   Clinical Narrative:    Transition of Care (TOC) Screening Note   Patient Details  Name: Leroy Sea Date of Birth: 10/11/1965   Transition of Care [facility]) CM/SW Contact:    Chapman Fitch, RN Phone Number: 06/07/2023, 9:33 AM    Transition of Care Department [facility]) has reviewed patient and no TOC needs have been identified at this time.  If new patient transition needs arise, please place a TOC consult.   Transition of Care Asessment: Insurance and Status: Insurance coverage has been reviewed Patient has primary care physician: Yes     Prior/Current Home Services: No current home services Social Determinants of Health Reivew: SDOH reviewed no interventions necessary Readmission risk has been reviewed: Yes Transition of care needs: no transition of care needs at this time

## 2023-06-10 LAB — CULTURE, BLOOD (SINGLE)
Culture: NO GROWTH
Special Requests: ADEQUATE

## 2023-06-14 ENCOUNTER — Ambulatory Visit: Payer: 59 | Admitting: Internal Medicine

## 2023-06-23 ENCOUNTER — Ambulatory Visit (INDEPENDENT_AMBULATORY_CARE_PROVIDER_SITE_OTHER): Payer: 59 | Admitting: Internal Medicine

## 2023-06-23 ENCOUNTER — Encounter: Payer: Self-pay | Admitting: Internal Medicine

## 2023-06-23 VITALS — BP 159/98 | HR 75 | Ht 62.0 in | Wt 173.8 lb

## 2023-06-23 DIAGNOSIS — E119 Type 2 diabetes mellitus without complications: Secondary | ICD-10-CM | POA: Diagnosis not present

## 2023-06-23 DIAGNOSIS — E271 Primary adrenocortical insufficiency: Secondary | ICD-10-CM | POA: Diagnosis not present

## 2023-06-23 DIAGNOSIS — I10 Essential (primary) hypertension: Secondary | ICD-10-CM

## 2023-06-23 LAB — POCT CBG (FASTING - GLUCOSE)-MANUAL ENTRY: Glucose Fasting, POC: 98 mg/dL (ref 70–99)

## 2023-06-23 MED ORDER — OLMESARTAN MEDOXOMIL 20 MG PO TABS: 20.0000 mg | ORAL_TABLET | Freq: Every day | ORAL | 2 refills | Status: DC

## 2023-06-23 NOTE — Progress Notes (Signed)
Established Patient Office Visit  Subjective:  Patient ID: Samantha Deleon, female    DOB: 19-Aug-1966  Age: 57 y.o. MRN: [id number]  Chief Complaint  Patient presents with   Follow-up    Hospital fu for hypotension and newly diagnosed adrenal insufficiency. BP now elevated although took midodrine today despite home reading of 150/74 at home.   No other concerns at this time.   Past Medical History:  Diagnosis Date   Asthma    Hypertension     Past Surgical History:  Procedure Laterality Date   COLONOSCOPY N/A 02/27/2022   Procedure: COLONOSCOPY;  Surgeon: Samantha Collins, DO;  Location: [facility] ENDOSCOPY;  Service: Gastroenterology;  Laterality: N/A;   ESOPHAGOGASTRODUODENOSCOPY N/A 02/27/2022   Procedure: ESOPHAGOGASTRODUODENOSCOPY (EGD);  Surgeon: Samantha Collins, DO;  Location: [facility] ENDOSCOPY;  Service: Gastroenterology;  Laterality: N/A;   JOINT REPLACEMENT Bilateral    Hips   TUBAL LIGATION      Social History   Socioeconomic History   Marital status: Single    Spouse name: Not on file   Number of children: Not on file   Years of education: Not on file   Highest education level: Not on file  Occupational History   Not on file  Tobacco Use   Smoking status: Never   Smokeless tobacco: Never  Vaping Use   Vaping status: Never Used  Substance and Sexual Activity   Alcohol use: Not Currently    Alcohol/week: 2.0 standard drinks of alcohol    Types: 1 Cans of beer, 1 Standard drinks or equivalent per week   Drug use: No   Sexual activity: Yes  Other Topics Concern   Not on file  Social History Narrative   Not on file   Social Determinants of Health   Financial Resource Strain: Low Risk  (05/09/2021)   [facility], [facility]   Overall Financial Resource Strain (CARDIA)    Difficulty of Paying Living Expenses: Not hard at all  Food Insecurity: No Food Insecurity (06/04/2023)   Hunger Vital Sign    Worried About Running Out  of Food in the Last Year: Never true    Ran Out of Food in the Last Year: Never true  Transportation Needs: No Transportation Needs (06/04/2023)   PRAPARE - Administrator, Civil Service (Medical): No    Lack of Transportation (Non-Medical): No  Physical Activity: Not on file  Stress: Not on file  Social Connections: Not on file  Intimate Partner Violence: Not At Risk (06/04/2023)   Humiliation, Afraid, Rape, and Kick questionnaire    Fear of Current or Ex-Partner: No    Emotionally Abused: No    Physically Abused: No    Sexually Abused: No    No family history on file.  Allergies  Allergen Reactions   Seasonal Ic [Cholestatin]     Review of Systems  All other systems reviewed and are negative.      Objective:   BP (!) 159/98   Pulse 75   Ht 5\' 2"  (1.575 m)   Wt 173 lb 12.8 oz (78.8 kg)   LMP 06/16/2014 (Approximate)   SpO2 96%   BMI 31.79 kg/m   Vitals:   06/23/23 0910  BP: (!) 159/98  Pulse: 75  Height: 5\' 2"  (1.575 m)  Weight: 173 lb 12.8 oz (78.8 kg)  SpO2: 96%  BMI (Calculated): 31.78    Physical Exam Vitals reviewed.  Constitutional:  General: She is not in acute distress.    Appearance: She is obese.  HENT:     Head: Normocephalic.     Nose: Nose normal.     Mouth/Throat:     Mouth: Mucous membranes are moist.  Eyes:     Extraocular Movements: Extraocular movements intact.     Pupils: Pupils are equal, round, and reactive to light.  Cardiovascular:     Rate and Rhythm: Normal rate and regular rhythm.     Heart sounds: No murmur heard. Pulmonary:     Effort: Pulmonary effort is normal.     Breath sounds: No rhonchi or rales.  Abdominal:     General: Abdomen is flat.     Palpations: There is no hepatomegaly, splenomegaly or mass.  Musculoskeletal:        General: Normal range of motion.     Cervical back: Normal range of motion. No tenderness.  Skin:    Findings: Rash present. Rash is macular and papular.     Comments:  Scratch marks  Neurological:     General: No focal deficit present.     Mental Status: She is alert and oriented to person, place, and time.     Cranial Nerves: No cranial nerve deficit.     Motor: No weakness.  Psychiatric:        Mood and Affect: Mood normal.        Behavior: Behavior normal.      Results for orders placed or performed in visit on 06/23/23  POCT CBG (Fasting - Glucose)  Result Value Ref Range   Glucose Fasting, POC 98 70 - 99 mg/dL        Assessment & Plan:   As per problem list. Dc midodrine, restart olmesartan.  Problem List Items Addressed This Visit       Cardiovascular and Mediastinum   Hypertension   Relevant Medications   olmesartan (BENICAR) 20 MG tablet     Endocrine   Type 2 diabetes mellitus without complication, without long-term current use of insulin (HCC) - Primary   Relevant Medications   olmesartan (BENICAR) 20 MG tablet   Other Relevant Orders   POCT CBG (Fasting - Glucose) (Completed)   Adrenal insufficiency (Addison'Nimo Verastegui disease) (HCC)    Return in about 2 weeks (around 07/07/2023) for BP followup.   Total time spent: 30 minutes  [name], MD  06/23/2023   This document may have been prepared by [facility] Voice Recognition software and as such may include unintentional dictation errors.

## 2023-07-09 ENCOUNTER — Ambulatory Visit: Payer: 59 | Admitting: Internal Medicine

## 2023-07-28 ENCOUNTER — Other Ambulatory Visit: Payer: Self-pay | Admitting: Internal Medicine

## 2023-07-28 DIAGNOSIS — I10 Essential (primary) hypertension: Secondary | ICD-10-CM

## 2023-07-28 MED ORDER — OLMESARTAN MEDOXOMIL 20 MG PO TABS
20.0000 mg | ORAL_TABLET | Freq: Every day | ORAL | 0 refills | Status: DC
Start: 2023-07-28 — End: 2023-10-12

## 2023-07-28 MED ORDER — AMLODIPINE BESYLATE 10 MG PO TABS: 10.0000 mg | ORAL_TABLET | Freq: Every morning | ORAL | 1 refills | Status: DC

## 2023-07-28 MED ORDER — METFORMIN HCL 500 MG PO TABS: 500.0000 mg | ORAL_TABLET | Freq: Two times a day (BID) | ORAL | 1 refills | Status: DC

## 2023-07-28 MED ORDER — ATORVASTATIN CALCIUM 10 MG PO TABS: 10.0000 mg | ORAL_TABLET | Freq: Every evening | ORAL | 1 refills | Status: DC

## 2023-08-06 ENCOUNTER — Ambulatory Visit: Payer: 59 | Admitting: Internal Medicine

## 2023-08-11 ENCOUNTER — Emergency Department: Payer: 59

## 2023-08-11 ENCOUNTER — Other Ambulatory Visit: Payer: Self-pay

## 2023-08-11 ENCOUNTER — Emergency Department
Admission: EM | Admit: 2023-08-11 | Discharge: 2023-08-11 | Disposition: A | Discharge status: Home / Self Care | Payer: 59 | Attending: Emergency Medicine | Admitting: Emergency Medicine

## 2023-08-11 DIAGNOSIS — R0989 Other specified symptoms and signs involving the circulatory and respiratory systems: Secondary | ICD-10-CM | POA: Diagnosis not present

## 2023-08-11 DIAGNOSIS — I10 Essential (primary) hypertension: Secondary | ICD-10-CM | POA: Diagnosis not present

## 2023-08-11 DIAGNOSIS — Z1152 Encounter for screening for COVID-19: Secondary | ICD-10-CM | POA: Diagnosis not present

## 2023-08-11 DIAGNOSIS — R079 Chest pain, unspecified: Secondary | ICD-10-CM | POA: Diagnosis not present

## 2023-08-11 DIAGNOSIS — B349 Viral infection, unspecified: Secondary | ICD-10-CM | POA: Insufficient documentation

## 2023-08-11 DIAGNOSIS — R059 Cough, unspecified: Secondary | ICD-10-CM | POA: Diagnosis not present

## 2023-08-11 DIAGNOSIS — D72829 Elevated white blood cell count, unspecified: Secondary | ICD-10-CM | POA: Diagnosis not present

## 2023-08-11 LAB — RESP PANEL BY RT-PCR (RSV, FLU A&B, COVID)  RVPGX2
Influenza A by PCR: NEGATIVE
Influenza B by PCR: NEGATIVE
Resp Syncytial Virus by PCR: NEGATIVE
SARS Coronavirus 2 by RT PCR: NEGATIVE

## 2023-08-11 LAB — BASIC METABOLIC PANEL
Anion gap: 10 (ref 5–15)
BUN: 10 mg/dL (ref 6–20)
CO2: 23 mmol/L (ref 22–32)
Calcium: 9.2 mg/dL (ref 8.9–10.3)
Chloride: 105 mmol/L (ref 98–111)
Creatinine, Ser: 0.88 mg/dL (ref 0.44–1.00)
GFR, Estimated: >60 mL/min (ref >60)
Glucose, Bld: 95 mg/dL (ref 70–99)
Potassium: 3.7 mmol/L (ref 3.5–5.1)
Sodium: 138 mmol/L (ref 135–145)

## 2023-08-11 LAB — CBC
HCT: 35.7 % — ABNORMAL LOW (ref 36.0–46.0)
Hemoglobin: 12 g/dL (ref 12.0–15.0)
MCH: 29 pg (ref 26.0–34.0)
MCHC: 33.6 g/dL (ref 30.0–36.0)
MCV: 86.2 fL (ref 80.0–100.0)
Platelets: 246 10*3/uL (ref 150–400)
RBC: 4.14 MIL/uL (ref 3.87–5.11)
RDW: 13.4 % (ref 11.5–15.5)
WBC: 17.7 10*3/uL — ABNORMAL HIGH (ref 4.0–10.5)
nRBC: 0 % (ref 0.0–0.2)

## 2023-08-11 MED ORDER — ONDANSETRON HCL 4 MG PO TABS: 4.0000 mg | ORAL_TABLET | Freq: Four times a day (QID) | ORAL | 0 refills | Status: AC | PRN

## 2023-08-11 MED ORDER — ACETAMINOPHEN 500 MG PO TABS
1000.0000 mg | ORAL_TABLET | Freq: Once | ORAL | Status: AC
  Administered 2023-08-11: 1000 mg via ORAL
  Filled 2023-08-11: qty 2

## 2023-08-11 MED ORDER — SODIUM CHLORIDE 0.9 % IV BOLUS
1000.0000 mL | Freq: Once | INTRAVENOUS | Status: AC
  Administered 2023-08-11: 1000 mL via INTRAVENOUS

## 2023-08-11 NOTE — Discharge Instructions (Addendum)
Your testing today was fortunately reassuring.  I sent a prescription for nausea medicine to your pharmacy that you can take as needed.  You can use Tylenol and ibuprofen to help with bodyaches if there is noted at the reason you should not take these.  Follow with your primary care doctor for further evaluation.  Return to the ER for new or worsening symptoms.

## 2023-08-11 NOTE — ED Provider Notes (Signed)
[facility] Provider Note    Event Date/Time   First MD Initiated Contact with Patient 08/11/23 1019     (approximate)   History   multiple complaints   HPI  Samantha Deleon is a 57 year old female presenting to the emergency department for evaluation of cough, body aches.  Last night, patient had onset of worsening cough and congestion with generalized bodyaches.  No fevers.  Has had some nausea without vomiting.  No known sick contacts.  Checked her blood pressure at home and she had systolics in the 140s which was higher than her normal.    Physical Exam   Triage Vital Signs: ED Triage Vitals  Encounter Vitals Group     BP 08/11/23 0947 139/88     Systolic BP Percentile --      Diastolic BP Percentile --      Pulse Rate 08/11/23 0947 (!) 117     Resp 08/11/23 0947 20     Temp 08/11/23 0946 98.4 F (36.9 C)     Temp src --      SpO2 08/11/23 0947 94 %     Weight 08/11/23 0946 162 lb (73.5 kg)     Height 08/11/23 0946 5\' 2"  (1.575 m)     Head Circumference --      Peak Flow --      Pain Score 08/11/23 0946 10     Pain Loc --      Pain Education --      Exclude from Growth Chart --     Most recent vital signs: Vitals:   08/11/23 0947 08/11/23 1259  BP: 139/88 132/77  Pulse: (!) 117 87  Resp: 20 18  Temp:  98.3 F (36.8 C)  SpO2: 94% 96%     General: Awake, interactive  HEENT: Congestion noted CV:  Regular rate, good peripheral perfusion.  Resp:  Unlabored respirations, lungs clear to auscultation Abd:  Nondistended, soft, nontender Neuro:  Symmetric facial movement, fluid speech   ED Results / Procedures / Treatments   Labs (all labs ordered are listed, but only abnormal results are displayed) Labs Reviewed  CBC - Abnormal; Notable for the following components:      Result Value   WBC 17.7 (*)    HCT 35.7 (*)    All other components within normal limits  RESP PANEL BY RT-PCR (RSV, FLU A&B, COVID)  RVPGX2  BASIC  METABOLIC PANEL     EKG EKG independently reviewed interpreted by myself (ER attending) demonstrates:    RADIOLOGY Imaging independently reviewed and interpreted by myself demonstrates:  CXR without focal consolidation  PROCEDURES:  Critical Care performed: No  Procedures   MEDICATIONS ORDERED IN ED: Medications  sodium chloride 0.9 % bolus 1,000 mL (0 mLs Intravenous Stopped 08/11/23 1300)  acetaminophen (TYLENOL) tablet 1,000 mg (1,000 mg Oral Given 08/11/23 1058)     IMPRESSION / MDM / ASSESSMENT AND PLAN / ED COURSE  I reviewed the triage vital signs and the nursing notes.  Differential diagnosis includes, but is not limited to, viral illness, pneumonia, dehydration, low suspicion for acute intra-abdominal process in the absence of abdominal pain, no urinary symptoms   Patient's presentation is most consistent with acute presentation with potential threat to life or bodily function.  57 year old female presenting to the emergency department for evaluation of bodyaches with associated cough, nausea.  Tachycardic on presentation here, but improved after IV fluids.  Leukocytosis noted, but no pneumonia on x-Virgilene Stryker, suspect likely  viral process.  Viral swab negative.  Patient feeling much improved after Tylenol and IV fluids.  She is comfortable discharge home.  Supportive care measures provided.  Will DC with prescription for Zofran.  Strict return precautions provided.      FINAL CLINICAL IMPRESSION(S) / ED DIAGNOSES   Final diagnoses:  Nonspecific syndrome suggestive of viral illness     Rx / DC Orders   ED Discharge Orders          Ordered    ondansetron (ZOFRAN) 4 MG tablet  Every 6 hours PRN        08/11/23 1312             Note:  This document was prepared using Dragon voice recognition software and may include unintentional dictation errors.   Trinna Post, MD 08/11/23 [phone]

## 2023-08-11 NOTE — ED Triage Notes (Signed)
Pt to ED for HTN. Bilateral hip pain since having hip replacement. Reports flu sx.

## 2023-08-11 NOTE — ED Notes (Signed)
See triage notes. Patient c/o her hips being sore, flu like symptoms, and hypertension.

## 2023-08-13 ENCOUNTER — Ambulatory Visit: Payer: 59 | Admitting: Internal Medicine

## 2023-08-16 ENCOUNTER — Encounter: Payer: Self-pay | Admitting: Internal Medicine

## 2023-08-16 ENCOUNTER — Ambulatory Visit (INDEPENDENT_AMBULATORY_CARE_PROVIDER_SITE_OTHER): Payer: 59 | Admitting: Internal Medicine

## 2023-08-16 VITALS — BP 116/78 | HR 77 | Ht 62.0 in | Wt 171.0 lb

## 2023-08-16 DIAGNOSIS — E119 Type 2 diabetes mellitus without complications: Secondary | ICD-10-CM | POA: Diagnosis not present

## 2023-08-16 DIAGNOSIS — E7800 Pure hypercholesterolemia, unspecified: Secondary | ICD-10-CM | POA: Diagnosis not present

## 2023-08-16 DIAGNOSIS — I10 Essential (primary) hypertension: Secondary | ICD-10-CM | POA: Diagnosis not present

## 2023-08-16 LAB — POCT CBG (FASTING - GLUCOSE)-MANUAL ENTRY: Glucose Fasting, POC: 88 mg/dL (ref 70–99)

## 2023-08-16 NOTE — Progress Notes (Signed)
Established Patient Office Visit  Subjective:  Patient ID: Samantha Deleon, female    DOB: [birth year]  Age: 57 y.o. MRN: [id number]  Chief Complaint  Patient presents with   Follow-up    4 month follow up    No new complaints, here for lab review and medication refills. Failed to have previsit labs done. Recently evaluated in ED for URI.  No other concerns at this time.   Past Medical History:  Diagnosis Date   Asthma    Hypertension     Past Surgical History:  Procedure Laterality Date   COLONOSCOPY N/A 02/27/2022   Procedure: COLONOSCOPY;  Surgeon: Jaynie Collins, DO;  Location: [facility] ENDOSCOPY;  Service: Gastroenterology;  Laterality: N/A;   ESOPHAGOGASTRODUODENOSCOPY N/A 02/27/2022   Procedure: ESOPHAGOGASTRODUODENOSCOPY (EGD);  Surgeon: Jaynie Collins, DO;  Location: [facility] ENDOSCOPY;  Service: Gastroenterology;  Laterality: N/A;   JOINT REPLACEMENT Bilateral    Hips   TUBAL LIGATION      Social History   Socioeconomic History   Marital status: Single    Spouse name: Not on file   Number of children: Not on file   Years of education: Not on file   Highest education level: Not on file  Occupational History   Not on file  Tobacco Use   Smoking status: Never   Smokeless tobacco: Never  Vaping Use   Vaping status: Never Used  Substance and Sexual Activity   Alcohol use: Not Currently    Alcohol/week: 2.0 standard drinks of alcohol    Types: 1 Cans of beer, 1 Standard drinks or equivalent per week   Drug use: No   Sexual activity: Yes  Other Topics Concern   Not on file  Social History Narrative   Not on file   Social Drivers of Health   Financial Resource Strain: Low Risk  (05/09/2021)   [facility], [facility]   Overall Financial Resource Strain (CARDIA)    Difficulty of Paying Living Expenses: Not hard at all  Food Insecurity: No Food Insecurity (06/04/2023)   Hunger Vital Sign    Worried About Running Out of Food in  the Last Year: Never true    Ran Out of Food in the Last Year: Never true  Transportation Needs: No Transportation Needs (06/04/2023)   PRAPARE - Administrator, Civil Service (Medical): No    Lack of Transportation (Non-Medical): No  Physical Activity: Not on file  Stress: Not on file  Social Connections: Not on file  Intimate Partner Violence: Not At Risk (06/04/2023)   Humiliation, Afraid, Rape, and Kick questionnaire    Fear of Current or Ex-Partner: No    Emotionally Abused: No    Physically Abused: No    Sexually Abused: No    No family history on file.  Allergies  Allergen Reactions   Seasonal Ic [Cholestatin]     Outpatient Medications Prior to Visit  Medication Sig   amLODipine (NORVASC) 10 MG tablet Take 1 tablet (10 mg total) by mouth every morning.   atorvastatin (LIPITOR) 10 MG tablet Take 1 tablet (10 mg total) by mouth every evening.   busPIRone (BUSPAR) 10 MG tablet Take 10 mg by mouth 3 (three) times daily.   doxepin (SINEQUAN) 25 MG capsule Take 50 mg by mouth at bedtime.   fluticasone (FLONASE) 50 MCG/ACT nasal spray Place 1 spray into both nostrils daily.   glucose blood (ONETOUCH ULTRA TEST) test strip Use with device to  check sugars twice daily   hydrOXYzine (ATARAX) 25 MG tablet Take 1 tablet (25 mg total) by mouth 3 (three) times daily as needed.   metFORMIN (GLUCOPHAGE) 500 MG tablet Take 1 tablet (500 mg total) by mouth 2 (two) times daily with a meal.   mirtazapine (REMERON) 7.5 MG tablet Take 7.5 mg by mouth at bedtime.   olmesartan (BENICAR) 20 MG tablet Take 1 tablet (20 mg total) by mouth daily.   ondansetron (ZOFRAN) 4 MG tablet Take 1 tablet (4 mg total) by mouth every 6 (six) hours as needed for up to 7 days for nausea or vomiting.   OneTouch UltraSoft 2 Lancets MISC 1 Lancet by Does not apply route 2 (two) times daily.   pantoprazole (PROTONIX) 40 MG tablet Take 40 mg by mouth daily.   SYMBICORT 160-4.5 MCG/ACT inhaler Inhale 2 puffs  into the lungs 2 (two) times daily.   tranexamic acid (LYSTEDA) 650 MG TABS tablet Take 325 mg by mouth 2 (two) times daily.   albuterol (PROVENTIL HFA;VENTOLIN HFA) 108 (90 Base) MCG/ACT inhaler Inhale 2 puffs into the lungs every 6 (six) hours as needed for wheezing or shortness of breath. (Patient not taking: Reported on 06/04/2023)   clobetasol (TEMOVATE) 0.05 % external solution Apply 1 Application topically daily. Mixed with Cera Ve (Patient not taking: Reported on 08/16/2023)   predniSONE (DELTASONE) 5 MG tablet Take 1 tablet (5 mg total) by mouth daily with breakfast. (Patient not taking: Reported on 08/16/2023)   tizanidine (ZANAFLEX) 2 MG capsule Take 2 mg by mouth 3 (three) times daily. (Patient not taking: Reported on 08/16/2023)   triamcinolone ointment (KENALOG) 0.5 % APPLY TOPICALLY TO THE AFFECTED AREA TWICE DAILY (Patient not taking: Reported on 08/16/2023)   No facility-administered medications prior to visit.    Review of Systems  All other systems reviewed and are negative.      Objective:   BP 116/78   Pulse 77   Ht 5\' 2"  (1.575 m)   Wt 171 lb (77.6 kg)   LMP 06/16/2014 (Approximate)   SpO2 99%   BMI 31.28 kg/m   Vitals:   08/16/23 1531  BP: 116/78  Pulse: 77  Height: 5\' 2"  (1.575 m)  Weight: 171 lb (77.6 kg)  SpO2: 99%  BMI (Calculated): 31.27    Physical Exam Vitals reviewed.  Constitutional:      General: She is not in acute distress.    Appearance: She is obese.  HENT:     Head: Normocephalic.     Nose: Nose normal.     Mouth/Throat:     Mouth: Mucous membranes are moist.  Eyes:     Extraocular Movements: Extraocular movements intact.     Pupils: Pupils are equal, round, and reactive to light.  Cardiovascular:     Rate and Rhythm: Normal rate and regular rhythm.     Heart sounds: No murmur heard. Pulmonary:     Effort: Pulmonary effort is normal.     Breath sounds: No rhonchi or rales.  Abdominal:     General: Abdomen is flat.      Palpations: There is no hepatomegaly, splenomegaly or mass.  Musculoskeletal:        General: Normal range of motion.     Cervical back: Normal range of motion. No tenderness.  Skin:    Findings: Rash present. Rash is macular and papular.     Comments: Scratch marks  Neurological:     General: No focal deficit present.  Mental Status: She is alert and oriented to person, place, and time.     Cranial Nerves: No cranial nerve deficit.     Motor: No weakness.  Psychiatric:        Mood and Affect: Mood normal.        Behavior: Behavior normal.      Results for orders placed or performed in visit on 08/16/23  POCT CBG (Fasting - Glucose)  Result Value Ref Range   Glucose Fasting, POC 88 70 - 99 mg/dL    Recent Results (from the past 2160 hours)  CBC with Differential     Status: None   Collection Time: 06/03/23 11:30 PM  Result Value Ref Range   WBC 6.9 4.0 - 10.5 K/uL   RBC 4.25 3.87 - 5.11 MIL/uL   Hemoglobin 12.2 12.0 - 15.0 g/dL   HCT 21.3 08.6 - 57.8 %   MCV 86.4 80.0 - 100.0 fL   MCH 28.7 26.0 - 34.0 pg   MCHC 33.2 30.0 - 36.0 g/dL   RDW 46.9 62.9 - 52.8 %   Platelets 243 150 - 400 K/uL   nRBC 0.0 0.0 - 0.2 %   Neutrophils Relative % 58 %   Neutro Abs 4.0 1.7 - 7.7 K/uL   Lymphocytes Relative 32 %   Lymphs Abs 2.2 0.7 - 4.0 K/uL   Monocytes Relative 6 %   Monocytes Absolute 0.4 0.1 - 1.0 K/uL   Eosinophils Relative 3 %   Eosinophils Absolute 0.2 0.0 - 0.5 K/uL   Basophils Relative 1 %   Basophils Absolute 0.1 0.0 - 0.1 K/uL   Immature Granulocytes 0 %   Abs Immature Granulocytes 0.02 0.00 - 0.07 K/uL    Comment: Performed at [facility], [address] [place], [place] [address]  Comprehensive metabolic panel     Status: Abnormal   Collection Time: 06/03/23 11:30 PM  Result Value Ref Range   Sodium 139 135 - 145 mmol/L   Potassium 2.6 (LL) 3.5 - 5.1 mmol/L    Comment: CRITICAL RESULT CALLED TO, READ BACK BY AND VERIFIED WITH LISA THOMPSON  @0008  06/04/23 MJU    Chloride 104 98 - 111 mmol/L   CO2 23 22 - 32 mmol/L   Glucose, Bld 133 (H) 70 - 99 mg/dL    Comment: Glucose reference range applies only to samples taken after fasting for at least 8 hours.   BUN 13 6 - 20 mg/dL   Creatinine, Ser 4.01 0.44 - 1.00 mg/dL   Calcium 8.7 (L) 8.9 - 10.3 mg/dL   Total Protein 7.9 6.5 - 8.1 g/dL   Albumin 4.3 3.5 - 5.0 g/dL   AST 24 15 - 41 U/L   ALT 16 0 - 44 U/L   Alkaline Phosphatase 80 38 - 126 U/L   Total Bilirubin 0.2 (L) 0.3 - 1.2 mg/dL   GFR, Estimated >02 >72 mL/min    Comment: (NOTE) Calculated using the CKD-EPI Creatinine Equation (2021)    Anion gap 12 5 - 15    Comment: Performed at [facility], [address] [place], [place] [address]  Troponin I (High Sensitivity)     Status: None   Collection Time: 06/03/23 11:30 PM  Result Value Ref Range   Troponin I (High Sensitivity) 3 <18 ng/L    Comment: (NOTE) Elevated high sensitivity troponin I (hsTnI) values and significant  changes across serial measurements may suggest ACS but many other  chronic and acute conditions are known  to elevate hsTnI results.  Refer to the "Links" section for chest pain algorithms and additional  guidance. Performed at [facility], [address] [place], [place] [address]   Ethanol     Status: Abnormal   Collection Time: 06/03/23 11:30 PM  Result Value Ref Range   Alcohol, Ethyl (B) 324 (HH) <10 mg/dL    Comment: CRITICAL RESULT CALLED TO, READ BACK BY AND VERIFIED WITH LISA THOMPSON @0026  ON 06/04/23 SKL (NOTE) Lowest detectable limit for serum alcohol is 10 mg/dL.  For medical purposes only. Performed at [facility], [address] [place], [place] [address]   Urinalysis, Routine w reflex microscopic -Urine, Clean Catch     Status: Abnormal   Collection Time: 06/03/23 11:30 PM  Result Value Ref Range   Color, Urine COLORLESS (A) YELLOW   APPearance CLEAR (A) CLEAR   Specific Gravity, Urine  1.002 (L) 1.005 - 1.030   pH 7.0 5.0 - 8.0   Glucose, UA NEGATIVE NEGATIVE mg/dL   Hgb urine dipstick SMALL (A) NEGATIVE   Bilirubin Urine NEGATIVE NEGATIVE   Ketones, ur NEGATIVE NEGATIVE mg/dL   Protein, ur NEGATIVE NEGATIVE mg/dL   Nitrite NEGATIVE NEGATIVE   Leukocytes,Ua MODERATE (A) NEGATIVE   RBC / HPF 0-5 0 - 5 RBC/hpf   WBC, UA 0-5 0 - 5 WBC/hpf   Bacteria, UA RARE (A) NONE SEEN   Squamous Epithelial / HPF 0 0 - 5 /HPF    Comment: Performed at [facility], [address] [place], [place] [address]  Cortisol     Status: None   Collection Time: 06/03/23 11:30 PM  Result Value Ref Range   Cortisol, Plasma 7.0 ug/dL    Comment: (NOTE) AM    6.7 - 22.6 ug/dL PM   <36.6       ug/dL Performed at [facility], [address] [place], [place] [address]   Troponin I (High Sensitivity)     Status: None   Collection Time: 06/04/23  2:46 AM  Result Value Ref Range   Troponin I (High Sensitivity) <2 <18 ng/L    Comment: (NOTE) Elevated high sensitivity troponin I (hsTnI) values and significant  changes across serial measurements may suggest ACS but many other  chronic and acute conditions are known to elevate hsTnI results.  Refer to the "Links" section for chest pain algorithms and additional  guidance. Performed at [facility], [address] [place], [place] [address]   TSH     Status: None   Collection Time: 06/04/23  2:46 AM  Result Value Ref Range   TSH 0.904 0.350 - 4.500 uIU/mL    Comment: Performed by a 3rd Generation assay with a functional sensitivity of <=0.01 uIU/mL. Performed at [facility], [address] [place], [place] [address]   Magnesium     Status: None   Collection Time: 06/04/23  2:48 AM  Result Value Ref Range   Magnesium 2.2 1.7 - 2.4 mg/dL    Comment: Performed at [facility], [address] [place], [place] [address]  HIV Antibody (routine testing w rflx)     Status: None   Collection Time:  06/04/23 12:13 PM  Result Value Ref Range   HIV Screen 4th Generation wRfx Non Reactive Non Reactive    Comment: Performed at [facility], [address], [place], [place] [address]  Potassium     Status: None   Collection Time: 06/04/23 12:13 PM  Result Value Ref Range   Potassium 3.6 3.5 - 5.1 mmol/L    Comment: Performed at [facility], [address] [place], [place] [address]  ECHOCARDIOGRAM COMPLETE     Status: None   Collection Time: 06/04/23  5:05 PM  Result Value Ref Range   BP 97/55 mmHg   Ao pk vel 1.93 m/Rosetta Rupnow   AV Area VTI 2.97 cm2   AR max vel 3.06 cm2   AV Mean grad 8.0 mmHg   AV Peak grad 14.9 mmHg   Theodosia Bahena' Lateral 3.10 cm   AV Area mean vel 2.88 cm2   Area-P 1/2 5.16 cm2   MV VTI 4.34 cm2   Est EF 60 - 65%   Basic metabolic panel     Status: Abnormal   Collection Time: 06/05/23  3:38 AM  Result Value Ref Range   Sodium 140 135 - 145 mmol/L   Potassium 3.5 3.5 - 5.1 mmol/L   Chloride 109 98 - 111 mmol/L   CO2 21 (L) 22 - 32 mmol/L   Glucose, Bld 98 70 - 99 mg/dL    Comment: Glucose reference range applies only to samples taken after fasting for at least 8 hours.   BUN 15 6 - 20 mg/dL   Creatinine, Ser 8.41 (H) 0.44 - 1.00 mg/dL   Calcium 7.9 (L) 8.9 - 10.3 mg/dL   GFR, Estimated 57 (L) >60 mL/min    Comment: (NOTE) Calculated using the CKD-EPI Creatinine Equation (2021)    Anion gap 10 5 - 15    Comment: Performed at [facility], [address] [place], [place] [address]  ACTH     Status: None   Collection Time: 06/05/23  3:38 AM  Result Value Ref Range   C206 ACTH 25.6 7.2 - 63.3 pg/mL    Comment: (NOTE) ACTH reference interval for samples collected between 7 and 10 AM. Performed At: [facility] [address] [place], [place] [id number] Jolene Schimke MD [id number]   Cortisol-am, blood     Status: None   Collection Time: 06/05/23  3:38 AM  Result Value Ref Range   Cortisol - AM 11.5 6.7 - 22.6 ug/dL     Comment: Performed at [facility], [address], [place], [place] [address]  Glucose, capillary     Status: None   Collection Time: 06/05/23  5:32 AM  Result Value Ref Range   Glucose-Capillary 92 70 - 99 mg/dL    Comment: Glucose reference range applies only to samples taken after fasting for at least 8 hours.  ACTH stimulation, 3 time points     Status: None   Collection Time: 06/05/23  8:11 AM  Result Value Ref Range   Cortisol, Base 9.4 ug/dL    Comment: NO NORMAL RANGE ESTABLISHED FOR THIS TEST   Cortisol, 30 Min 15.5 ug/dL   Cortisol, 60 Min 43.3 ug/dL    Comment: Performed at [facility], [address] [place], [place] [address]  Culture, blood (single) w Reflex to ID Panel     Status: None   Collection Time: 06/05/23 11:57 AM   Specimen: BLOOD  Result Value Ref Range   Specimen Description BLOOD BLOOD RIGHT HAND    Special Requests      BOTTLES DRAWN AEROBIC AND ANAEROBIC Blood Culture adequate volume   Culture      NO GROWTH 5 DAYS Performed at [facility], [address], [place], [place] [address]    Report  Status 06/10/2023 FINAL   Urine Culture (for pregnant, neutropenic or urologic patients or patients with an indwelling urinary catheter)     Status: Abnormal   Collection Time: 06/05/23  2:59 PM   Specimen: Urine, Clean Catch  Result Value Ref Range   Specimen Description      URINE, CLEAN CATCH Performed at [facility], [address], [place], [place] [address]    Special Requests      NONE Performed at [facility], [address] [place], [place] [address]    Culture (A)     <10,000 COLONIES/mL INSIGNIFICANT GROWTH Performed at [facility], [address], [place], [place] [address]    Report Status 06/06/2023 FINAL   Urine Drug Screen, Qualitative ([facility] only)     Status: Abnormal   Collection Time: 06/05/23  2:59 PM  Result Value Ref Range   Tricyclic, Ur Screen POSITIVE (A) NONE DETECTED    Amphetamines, Ur Screen NONE DETECTED NONE DETECTED   MDMA (Ecstasy)Ur Screen NONE DETECTED NONE DETECTED   Cocaine Metabolite,Ur O'Neill NONE DETECTED NONE DETECTED   Opiate, Ur Screen NONE DETECTED NONE DETECTED   Phencyclidine (PCP) Ur Tanaysia Bhardwaj NONE DETECTED NONE DETECTED   Cannabinoid 50 Ng, Ur  NONE DETECTED NONE DETECTED   Barbiturates, Ur Screen NONE DETECTED NONE DETECTED   Benzodiazepine, Ur Scrn POSITIVE (A) NONE DETECTED   Methadone Scn, Ur NONE DETECTED NONE DETECTED    Comment: (NOTE) Tricyclics + metabolites, urine    Cutoff 1000 ng/mL Amphetamines + metabolites, urine  Cutoff 1000 ng/mL MDMA (Ecstasy), urine              Cutoff 500 ng/mL Cocaine Metabolite, urine          Cutoff 300 ng/mL Opiate + metabolites, urine        Cutoff 300 ng/mL Phencyclidine (PCP), urine         Cutoff 25 ng/mL Cannabinoid, urine                 Cutoff 50 ng/mL Barbiturates + metabolites, urine  Cutoff 200 ng/mL Benzodiazepine, urine              Cutoff 200 ng/mL Methadone, urine                   Cutoff 300 ng/mL  The urine drug screen provides only a preliminary, unconfirmed analytical test result and should not be used for non-medical purposes. Clinical consideration and professional judgment should be applied to any positive drug screen result due to possible interfering substances. A more specific alternate chemical method must be used in order to obtain a confirmed analytical result. Gas chromatography / mass spectrometry (GC/MS) is the preferred confirm atory method. Performed at [facility], [address] [place], [place] [address]   Basic metabolic panel     Status: Abnormal   Collection Time: 06/05/23  4:06 PM  Result Value Ref Range   Sodium 136 135 - 145 mmol/L   Potassium 4.4 3.5 - 5.1 mmol/L   Chloride 111 98 - 111 mmol/L   CO2 18 (L) 22 - 32 mmol/L   Glucose, Bld 132 (H) 70 - 99 mg/dL    Comment: Glucose reference range applies only to samples taken after fasting  for at least 8 hours.   BUN 19 6 - 20 mg/dL   Creatinine, Ser 6.64 (H) 0.44 - 1.00 mg/dL   Calcium 7.5 (L) 8.9 - 10.3 mg/dL   GFR, Estimated  50 (L) >60 mL/min    Comment: (NOTE) Calculated using the CKD-EPI Creatinine Equation (2021)    Anion gap 7 5 - 15    Comment: Performed at [facility], [address] [place], [place] [address]  Lactic acid, plasma     Status: None   Collection Time: 06/05/23  4:06 PM  Result Value Ref Range   Lactic Acid, Venous 1.7 0.5 - 1.9 mmol/L    Comment: Performed at [facility], [address] [place], [place] [address]  Procalcitonin     Status: None   Collection Time: 06/05/23  4:06 PM  Result Value Ref Range   Procalcitonin <0.10 ng/mL    Comment:        Interpretation: PCT (Procalcitonin) <= 0.5 ng/mL: Systemic infection (sepsis) is not likely. Local bacterial infection is possible. (NOTE)       Sepsis PCT Algorithm           Lower Respiratory Tract                                      Infection PCT Algorithm    ----------------------------     ----------------------------         PCT < 0.25 ng/mL                PCT < 0.10 ng/mL          Strongly encourage             Strongly discourage   discontinuation of antibiotics    initiation of antibiotics    ----------------------------     -----------------------------       PCT 0.25 - 0.50 ng/mL            PCT 0.10 - 0.25 ng/mL               OR       >80% decrease in PCT            Discourage initiation of                                            antibiotics      Encourage discontinuation           of antibiotics    ----------------------------     -----------------------------         PCT >= 0.50 ng/mL              PCT 0.26 - 0.50 ng/mL               AND        <80% decrease in PCT             Encourage initiation of                                             antibiotics       Encourage continuation           of antibiotics    ----------------------------      -----------------------------        PCT >= 0.50 ng/mL  PCT > 0.50 ng/mL               AND         increase in PCT                  Strongly encourage                                      initiation of antibiotics    Strongly encourage escalation           of antibiotics                                     -----------------------------                                           PCT <= 0.25 ng/mL                                                 OR                                        > 80% decrease in PCT                                      Discontinue / Do not initiate                                             antibiotics  Performed at [facility], [address] [place], [place] [address]   CBC     Status: Abnormal   Collection Time: 06/05/23  4:06 PM  Result Value Ref Range   WBC 9.2 4.0 - 10.5 K/uL   RBC 3.69 (L) 3.87 - 5.11 MIL/uL   Hemoglobin 10.6 (L) 12.0 - 15.0 g/dL   HCT 60.4 (L) 54.0 - 98.1 %   MCV 85.1 80.0 - 100.0 fL   MCH 28.7 26.0 - 34.0 pg   MCHC 33.8 30.0 - 36.0 g/dL   RDW 19.1 47.8 - 29.5 %   Platelets 235 150 - 400 K/uL   nRBC 0.0 0.0 - 0.2 %    Comment: Performed at [facility], [address], [place], [place] [address]  Basic metabolic panel     Status: Abnormal   Collection Time: 06/06/23  3:15 AM  Result Value Ref Range   Sodium 137 135 - 145 mmol/L   Potassium 4.6 3.5 - 5.1 mmol/L   Chloride 108 98 - 111 mmol/L   CO2 21 (L) 22 - 32 mmol/L   Glucose, Bld 135 (H) 70 - 99 mg/dL    Comment: Glucose reference range applies only to samples taken after fasting for at least 8 hours.   BUN 20 6 - 20 mg/dL   Creatinine,  Ser 1.17 (H) 0.44 - 1.00 mg/dL   Calcium 7.4 (L) 8.9 - 10.3 mg/dL   GFR, Estimated 55 (L) >60 mL/min    Comment: (NOTE) Calculated using the CKD-EPI Creatinine Equation (2021)    Anion gap 8 5 - 15    Comment: Performed at [facility], [address] [place], [place] [address]   Magnesium     Status: None   Collection Time: 06/06/23  3:15 AM  Result Value Ref Range   Magnesium 1.9 1.7 - 2.4 mg/dL    Comment: Performed at [facility], [address] [place], [place] [address]  CBC     Status: Abnormal   Collection Time: 06/06/23  3:15 AM  Result Value Ref Range   WBC 9.8 4.0 - 10.5 K/uL   RBC 3.71 (L) 3.87 - 5.11 MIL/uL   Hemoglobin 10.7 (L) 12.0 - 15.0 g/dL   HCT 09.8 (L) 11.9 - 14.7 %   MCV 86.8 80.0 - 100.0 fL   MCH 28.8 26.0 - 34.0 pg   MCHC 33.2 30.0 - 36.0 g/dL   RDW 82.9 56.2 - 13.0 %   Platelets 233 150 - 400 K/uL   nRBC 0.0 0.0 - 0.2 %    Comment: Performed at [facility], [address] [place], [place] [address]  Glucose, capillary     Status: Abnormal   Collection Time: 06/06/23  4:17 AM  Result Value Ref Range   Glucose-Capillary 116 (H) 70 - 99 mg/dL    Comment: Glucose reference range applies only to samples taken after fasting for at least 8 hours.  Basic metabolic panel     Status: Abnormal   Collection Time: 06/06/23  1:58 PM  Result Value Ref Range   Sodium 138 135 - 145 mmol/L   Potassium 4.0 3.5 - 5.1 mmol/L   Chloride 108 98 - 111 mmol/L   CO2 22 22 - 32 mmol/L   Glucose, Bld 153 (H) 70 - 99 mg/dL    Comment: Glucose reference range applies only to samples taken after fasting for at least 8 hours.   BUN 18 6 - 20 mg/dL   Creatinine, Ser 4.69 (H) 0.44 - 1.00 mg/dL   Calcium 7.4 (L) 8.9 - 10.3 mg/dL   GFR, Estimated >62 >95 mL/min    Comment: (NOTE) Calculated using the CKD-EPI Creatinine Equation (2021)    Anion gap 8 5 - 15    Comment: Performed at [facility], [address] [place], [place] [address]  CBC     Status: Abnormal   Collection Time: 06/07/23  3:54 AM  Result Value Ref Range   WBC 13.5 (H) 4.0 - 10.5 K/uL   RBC 3.89 3.87 - 5.11 MIL/uL   Hemoglobin 11.2 (L) 12.0 - 15.0 g/dL   HCT 24.4 (L) 01.0 - 27.2 %   MCV 85.6 80.0 - 100.0 fL   MCH 28.8 26.0 - 34.0 pg   MCHC 33.6 30.0  - 36.0 g/dL   RDW 53.6 64.4 - 03.4 %   Platelets 261 150 - 400 K/uL   nRBC 0.0 0.0 - 0.2 %    Comment: Performed at [facility], [address] [place], [place] [address]  Basic metabolic panel     Status: Abnormal   Collection Time: 06/07/23  3:54 AM  Result Value Ref Range   Sodium 141 135 - 145 mmol/L   Potassium 4.0 3.5 - 5.1 mmol/L   Chloride 108 98 - 111 mmol/L  CO2 26 22 - 32 mmol/L   Glucose, Bld 124 (H) 70 - 99 mg/dL    Comment: Glucose reference range applies only to samples taken after fasting for at least 8 hours.   BUN 16 6 - 20 mg/dL   Creatinine, Ser 2.44 0.44 - 1.00 mg/dL   Calcium 7.7 (L) 8.9 - 10.3 mg/dL   GFR, Estimated >01 >02 mL/min    Comment: (NOTE) Calculated using the CKD-EPI Creatinine Equation (2021)    Anion gap 7 5 - 15    Comment: Performed at [facility], [address] [place], [place] [address]  Magnesium     Status: None   Collection Time: 06/07/23  3:54 AM  Result Value Ref Range   Magnesium 2.1 1.7 - 2.4 mg/dL    Comment: Performed at [facility], [address] [place], [place] [address]  POCT CBG (Fasting - Glucose)     Status: None   Collection Time: 06/23/23  9:16 AM  Result Value Ref Range   Glucose Fasting, POC 98 70 - 99 mg/dL  CBC     Status: Abnormal   Collection Time: 08/11/23  9:52 AM  Result Value Ref Range   WBC 17.7 (H) 4.0 - 10.5 K/uL   RBC 4.14 3.87 - 5.11 MIL/uL   Hemoglobin 12.0 12.0 - 15.0 g/dL   HCT 47.4 (L) 25.9 - 56.3 %   MCV 86.2 80.0 - 100.0 fL   MCH 29.0 26.0 - 34.0 pg   MCHC 33.6 30.0 - 36.0 g/dL   RDW 87.5 64.3 - 32.9 %   Platelets 246 150 - 400 K/uL   nRBC 0.0 0.0 - 0.2 %    Comment: Performed at [facility], [address] [place], [place] [address]  Basic metabolic panel     Status: None   Collection Time: 08/11/23  9:52 AM  Result Value Ref Range   Sodium 138 135 - 145 mmol/L   Potassium 3.7 3.5 - 5.1 mmol/L   Chloride 105 98 - 111 mmol/L   CO2 23 22  - 32 mmol/L   Glucose, Bld 95 70 - 99 mg/dL    Comment: Glucose reference range applies only to samples taken after fasting for at least 8 hours.   BUN 10 6 - 20 mg/dL   Creatinine, Ser 1.66 0.44 - 1.00 mg/dL   Calcium 9.2 8.9 - 06.3 mg/dL   GFR, Estimated >01 >60 mL/min    Comment: (NOTE) Calculated using the CKD-EPI Creatinine Equation (2021)    Anion gap 10 5 - 15    Comment: Performed at [facility], [address] [place], [place] [address]  Resp panel by RT-PCR (RSV, Flu A&B, Covid) Anterior Nasal Swab     Status: None   Collection Time: 08/11/23  9:52 AM   Specimen: Anterior Nasal Swab  Result Value Ref Range   SARS Coronavirus 2 by RT PCR NEGATIVE NEGATIVE    Comment: (NOTE) SARS-CoV-2 target nucleic acids are NOT DETECTED.  The SARS-CoV-2 RNA is generally detectable in upper respiratory specimens during the acute phase of infection. The lowest concentration of SARS-CoV-2 viral copies this assay can detect is 138 copies/mL. A negative result does not preclude SARS-Cov-2 infection and should not be used as the sole basis for treatment or other patient management decisions. A negative result may occur with  improper specimen collection/handling, submission of specimen other than nasopharyngeal swab, presence of viral mutation(Donterius Filley) within the areas targeted by this assay, and  inadequate number of viral copies(<138 copies/mL). A negative result must be combined with clinical observations, patient history, and epidemiological information. The expected result is Negative.  Fact Sheet for Patients:  BloggerCourse.com  Fact Sheet for Healthcare Providers:  SeriousBroker.it  This test is no t yet approved or cleared by the [place] FDA and  has been authorized for detection and/or diagnosis of SARS-CoV-2 by FDA under an Emergency Use Authorization (EUA). This EUA will remain  in effect (meaning this test can be  used) for the duration of the COVID-19 declaration under Section 564(b)(1) of the Act, 21 U.Daijanae Rafalski.C.section 360bbb-3(b)(1), unless the authorization is terminated  or revoked sooner.       Influenza A by PCR NEGATIVE NEGATIVE   Influenza B by PCR NEGATIVE NEGATIVE    Comment: (NOTE) The Xpert Xpress SARS-CoV-2/FLU/RSV plus assay is intended as an aid in the diagnosis of influenza from Nasopharyngeal swab specimens and should not be used as a sole basis for treatment. Nasal washings and aspirates are unacceptable for Xpert Xpress SARS-CoV-2/FLU/RSV testing.  Fact Sheet for Patients: BloggerCourse.com  Fact Sheet for Healthcare Providers: SeriousBroker.it  This test is not yet approved or cleared by the [place] FDA and has been authorized for detection and/or diagnosis of SARS-CoV-2 by FDA under an Emergency Use Authorization (EUA). This EUA will remain in effect (meaning this test can be used) for the duration of the COVID-19 declaration under Section 564(b)(1) of the Act, 21 U.Alysiah Suppa.C. section 360bbb-3(b)(1), unless the authorization is terminated or revoked.     Resp Syncytial Virus by PCR NEGATIVE NEGATIVE    Comment: (NOTE) Fact Sheet for Patients: BloggerCourse.com  Fact Sheet for Healthcare Providers: SeriousBroker.it  This test is not yet approved or cleared by the [place] FDA and has been authorized for detection and/or diagnosis of SARS-CoV-2 by FDA under an Emergency Use Authorization (EUA). This EUA will remain in effect (meaning this test can be used) for the duration of the COVID-19 declaration under Section 564(b)(1) of the Act, 21 U.Phoebe Marter.C. section 360bbb-3(b)(1), unless the authorization is terminated or revoked.  Performed at [facility], [address] [place], [place] [address]   POCT CBG (Fasting - Glucose)     Status: None   Collection  Time: 08/16/23  3:36 PM  Result Value Ref Range   Glucose Fasting, POC 88 70 - 99 mg/dL      Assessment & Plan:  As per problem list.  Problem List Items Addressed This Visit       Cardiovascular and Mediastinum   Hypertension     Endocrine   Type 2 diabetes mellitus without complication, without long-term current use of insulin (HCC) - Primary   Relevant Orders   POCT CBG (Fasting - Glucose) (Completed)   Other Visit Diagnoses       Pure hypercholesterolemia           Return in about 3 weeks (around 09/06/2023) for fu with labs prior.   Total time spent: 20 minutes  [name], MD  08/16/2023   This document may have been prepared by [facility] Voice Recognition software and as such may include unintentional dictation errors.

## 2023-08-19 NOTE — Progress Notes (Signed)
Presented with upper respiratory symptoms. Viral panel ordered to evaluate for possible etiology of symptoms.

## 2023-08-29 ENCOUNTER — Other Ambulatory Visit: Payer: Self-pay | Admitting: Internal Medicine

## 2023-08-29 DIAGNOSIS — I10 Essential (primary) hypertension: Secondary | ICD-10-CM

## 2023-08-30 ENCOUNTER — Other Ambulatory Visit: Payer: Self-pay | Admitting: Internal Medicine

## 2023-09-14 ENCOUNTER — Ambulatory Visit: Payer: 59 | Admitting: Internal Medicine

## 2023-09-20 ENCOUNTER — Ambulatory Visit: Payer: 59 | Admitting: Internal Medicine

## 2023-09-21 ENCOUNTER — Other Ambulatory Visit: Payer: Self-pay | Admitting: Internal Medicine

## 2023-09-21 ENCOUNTER — Ambulatory Visit (INDEPENDENT_AMBULATORY_CARE_PROVIDER_SITE_OTHER): Payer: 59 | Admitting: Internal Medicine

## 2023-09-21 VITALS — BP 119/81 | HR 70 | Temp 98.0°F | Ht 62.0 in | Wt 167.6 lb

## 2023-09-21 DIAGNOSIS — Z0130 Encounter for examination of blood pressure without abnormal findings: Secondary | ICD-10-CM

## 2023-09-21 DIAGNOSIS — E119 Type 2 diabetes mellitus without complications: Secondary | ICD-10-CM

## 2023-09-21 DIAGNOSIS — K219 Gastro-esophageal reflux disease without esophagitis: Secondary | ICD-10-CM | POA: Diagnosis not present

## 2023-09-21 LAB — POCT CBG (FASTING - GLUCOSE)-MANUAL ENTRY: Glucose Fasting, POC: 107 mg/dL — AB (ref 70–99)

## 2023-09-21 MED ORDER — PANTOPRAZOLE SODIUM 40 MG PO TBEC
40.0000 mg | DELAYED_RELEASE_TABLET | Freq: Every day | ORAL | 0 refills | Status: DC
Start: 2023-09-21 — End: 2023-10-12

## 2023-09-21 NOTE — Progress Notes (Signed)
Established Patient Office Visit  Subjective:  Patient ID: Samantha Deleon, female    DOB: 11/30/1965  Age: 58 y.o. MRN: [id number]  No chief complaint on file.   No new complaints, here for lab review and medication refills. Failed to have previsit labs done but fasting today.       No other concerns at this time.   Past Medical History:  Diagnosis Date   Asthma    Hypertension     Past Surgical History:  Procedure Laterality Date   COLONOSCOPY N/A 02/27/2022   Procedure: COLONOSCOPY;  Surgeon: Jaynie Collins, DO;  Location: [facility] ENDOSCOPY;  Service: Gastroenterology;  Laterality: N/A;   ESOPHAGOGASTRODUODENOSCOPY N/A 02/27/2022   Procedure: ESOPHAGOGASTRODUODENOSCOPY (EGD);  Surgeon: Jaynie Collins, DO;  Location: [facility] ENDOSCOPY;  Service: Gastroenterology;  Laterality: N/A;   JOINT REPLACEMENT Bilateral    Hips   TUBAL LIGATION      Social History   Socioeconomic History   Marital status: Single    Spouse name: Not on file   Number of children: Not on file   Years of education: Not on file   Highest education level: Not on file  Occupational History   Not on file  Tobacco Use   Smoking status: Never   Smokeless tobacco: Never  Vaping Use   Vaping status: Never Used  Substance and Sexual Activity   Alcohol use: Not Currently    Alcohol/week: 2.0 standard drinks of alcohol    Types: 1 Cans of beer, 1 Standard drinks or equivalent per week   Drug use: No   Sexual activity: Yes  Other Topics Concern   Not on file  Social History Narrative   Not on file   Social Drivers of Health   Financial Resource Strain: Low Risk  (05/09/2021)   Received from [facility], [facility]   Overall Financial Resource Strain (CARDIA)    Difficulty of Paying Living Expenses: Not hard at all  Food Insecurity: No Food Insecurity (06/04/2023)   Hunger Vital Sign    Worried About Running Out of Food in the Last Year: Never true    Ran Out of Food in the  Last Year: Never true  Transportation Needs: No Transportation Needs (06/04/2023)   PRAPARE - Administrator, Civil Service (Medical): No    Lack of Transportation (Non-Medical): No  Physical Activity: Not on file  Stress: Not on file  Social Connections: Not on file  Intimate Partner Violence: Not At Risk (06/04/2023)   Humiliation, Afraid, Rape, and Kick questionnaire    Fear of Current or Ex-Partner: No    Emotionally Abused: No    Physically Abused: No    Sexually Abused: No    No family history on file.  Allergies  Allergen Reactions   Seasonal Ic [Cholestatin]     Outpatient Medications Prior to Visit  Medication Sig   albuterol (PROVENTIL HFA;VENTOLIN HFA) 108 (90 Base) MCG/ACT inhaler Inhale 2 puffs into the lungs every 6 (six) hours as needed for wheezing or shortness of breath.   amLODipine (NORVASC) 10 MG tablet Take 1 tablet (10 mg total) by mouth every morning.   atorvastatin (LIPITOR) 10 MG tablet Take 1 tablet (10 mg total) by mouth every evening.   busPIRone (BUSPAR) 10 MG tablet Take 10 mg by mouth 3 (three) times daily.   doxepin (SINEQUAN) 25 MG capsule Take 50 mg by mouth at bedtime.   fluticasone (FLONASE) 50 MCG/ACT nasal spray Place  1 spray into both nostrils daily.   glucose blood (ONETOUCH ULTRA TEST) test strip Use with device to check sugars twice daily   hydrOXYzine (ATARAX) 25 MG tablet Take 1 tablet (25 mg total) by mouth 3 (three) times daily as needed.   metFORMIN (GLUCOPHAGE) 500 MG tablet Take 1 tablet (500 mg total) by mouth 2 (two) times daily with a meal.   mirtazapine (REMERON) 7.5 MG tablet Take 7.5 mg by mouth at bedtime.   olmesartan (BENICAR) 20 MG tablet Take 1 tablet (20 mg total) by mouth daily.   OneTouch UltraSoft 2 Lancets MISC 1 Lancet by Does not apply route 2 (two) times daily.   SYMBICORT 160-4.5 MCG/ACT inhaler Inhale 2 puffs into the lungs 2 (two) times daily.   tranexamic acid (LYSTEDA) 650 MG TABS tablet Take 325  mg by mouth 2 (two) times daily.   [DISCONTINUED] pantoprazole (PROTONIX) 40 MG tablet Take 40 mg by mouth daily.   clobetasol (TEMOVATE) 0.05 % external solution Apply 1 Application topically daily. Mixed with Cera Ve (Patient not taking: Reported on 08/16/2023)   predniSONE (DELTASONE) 5 MG tablet Take 1 tablet (5 mg total) by mouth daily with breakfast. (Patient not taking: Reported on 08/16/2023)   tizanidine (ZANAFLEX) 2 MG capsule Take 2 mg by mouth 3 (three) times daily. (Patient not taking: Reported on 08/16/2023)   triamcinolone ointment (KENALOG) 0.5 % APPLY TOPICALLY TO THE AFFECTED AREA TWICE DAILY (Patient not taking: Reported on 08/16/2023)   No facility-administered medications prior to visit.    Review of Systems  Constitutional:  Positive for weight loss (4 lbs).  HENT: Negative.    Eyes: Negative.   Respiratory: Negative.    Cardiovascular: Negative.   Gastrointestinal:  Positive for heartburn.  Genitourinary: Negative.   Skin: Negative.   Neurological: Negative.   Endo/Heme/Allergies: Negative.        Objective:   BP 119/81   Pulse 70   Temp 98 F (36.7 C)   Ht 5\' 2"  (1.575 m)   Wt 167 lb 9.6 oz (76 kg)   LMP 06/16/2014 (Approximate)   SpO2 99%   BMI 30.65 kg/m   Vitals:   09/21/23 1112  BP: 119/81  Pulse: 70  Temp: 98 F (36.7 C)  Height: 5\' 2"  (1.575 m)  Weight: 167 lb 9.6 oz (76 kg)  SpO2: 99%  BMI (Calculated): 30.65    Physical Exam Vitals reviewed.  Constitutional:      General: She is not in acute distress.    Appearance: She is obese.  HENT:     Head: Normocephalic.     Nose: Nose normal.     Mouth/Throat:     Mouth: Mucous membranes are moist.  Eyes:     Extraocular Movements: Extraocular movements intact.     Pupils: Pupils are equal, round, and reactive to light.  Cardiovascular:     Rate and Rhythm: Normal rate and regular rhythm.     Heart sounds: No murmur heard. Pulmonary:     Effort: Pulmonary effort is normal.      Breath sounds: No rhonchi or rales.  Abdominal:     General: Abdomen is flat.     Palpations: There is no hepatomegaly, splenomegaly or mass.  Musculoskeletal:        General: Normal range of motion.     Cervical back: Normal range of motion. No tenderness.  Skin:    Findings: Rash present. Rash is macular and papular.     Comments: Scratch  marks  Neurological:     General: No focal deficit present.     Mental Status: She is alert and oriented to person, place, and time.     Cranial Nerves: No cranial nerve deficit.     Motor: No weakness.  Psychiatric:        Mood and Affect: Mood normal.        Behavior: Behavior normal.      Results for orders placed or performed in visit on 09/21/23  POCT CBG (Fasting - Glucose)  Result Value Ref Range   Glucose Fasting, POC 107 (A) 70 - 99 mg/dL        Assessment & Plan:  As per problem list. Problem List Items Addressed This Visit       Endocrine   Type 2 diabetes mellitus without complication, without long-term current use of insulin (HCC) - Primary   Relevant Orders   POCT CBG (Fasting - Glucose) (Completed)   Other Visit Diagnoses       Gastroesophageal reflux disease, unspecified whether esophagitis present       Relevant Medications   pantoprazole (PROTONIX) 40 MG tablet   Other Relevant Orders   H. pylori breath test       Return in about 2 weeks (around 10/05/2023) for lab results.   Total time spent: 20 minutes  [name], MD  09/21/2023   This document may have been prepared by [facility] Voice Recognition software and as such may include unintentional dictation errors.

## 2023-09-22 LAB — H. PYLORI BREATH TEST: H pylori Breath Test: NEGATIVE

## 2023-10-05 ENCOUNTER — Ambulatory Visit: Payer: 59 | Admitting: Internal Medicine

## 2023-10-06 ENCOUNTER — Other Ambulatory Visit: Payer: Self-pay | Admitting: Internal Medicine

## 2023-10-06 DIAGNOSIS — L439 Lichen planus, unspecified: Secondary | ICD-10-CM | POA: Diagnosis not present

## 2023-10-12 ENCOUNTER — Encounter: Payer: Self-pay | Admitting: Internal Medicine

## 2023-10-12 ENCOUNTER — Ambulatory Visit (INDEPENDENT_AMBULATORY_CARE_PROVIDER_SITE_OTHER): Payer: 59 | Admitting: Internal Medicine

## 2023-10-12 VITALS — BP 126/75 | HR 72 | Ht 62.0 in | Wt 171.4 lb

## 2023-10-12 DIAGNOSIS — K219 Gastro-esophageal reflux disease without esophagitis: Secondary | ICD-10-CM

## 2023-10-12 DIAGNOSIS — E119 Type 2 diabetes mellitus without complications: Secondary | ICD-10-CM | POA: Diagnosis not present

## 2023-10-12 DIAGNOSIS — E782 Mixed hyperlipidemia: Secondary | ICD-10-CM | POA: Diagnosis not present

## 2023-10-12 DIAGNOSIS — E7800 Pure hypercholesterolemia, unspecified: Secondary | ICD-10-CM

## 2023-10-12 DIAGNOSIS — I10 Essential (primary) hypertension: Secondary | ICD-10-CM

## 2023-10-12 LAB — POCT CBG (FASTING - GLUCOSE)-MANUAL ENTRY: Glucose Fasting, POC: 99 mg/dL (ref 70–99)

## 2023-10-12 MED ORDER — PANTOPRAZOLE SODIUM 40 MG PO TBEC: 40.0000 mg | DELAYED_RELEASE_TABLET | Freq: Every day | ORAL | 0 refills | Status: DC

## 2023-10-12 MED ORDER — OLMESARTAN MEDOXOMIL 20 MG PO TABS: 20.0000 mg | ORAL_TABLET | Freq: Every day | ORAL | 0 refills | Status: DC

## 2023-10-12 MED ORDER — AMLODIPINE BESYLATE 10 MG PO TABS
10.0000 mg | ORAL_TABLET | Freq: Every morning | ORAL | 1 refills | Status: DC
Start: 2023-10-12 — End: 2023-11-02

## 2023-10-12 NOTE — Progress Notes (Signed)
Established Patient Office Visit  Subjective:  Patient ID: Samantha Deleon, female    DOB: [birth year]  Age: 58 y.o. MRN: [id number]  Chief Complaint  Patient presents with   Follow-up    Dyspepsia has resolved and H pylori test was negative.    No other concerns at this time.   Past Medical History:  Diagnosis Date   Asthma    Hypertension     Past Surgical History:  Procedure Laterality Date   COLONOSCOPY N/A 02/27/2022   Procedure: COLONOSCOPY;  Surgeon: Jaynie Collins, DO;  Location: [facility] ENDOSCOPY;  Service: Gastroenterology;  Laterality: N/A;   ESOPHAGOGASTRODUODENOSCOPY N/A 02/27/2022   Procedure: ESOPHAGOGASTRODUODENOSCOPY (EGD);  Surgeon: Jaynie Collins, DO;  Location: [facility] ENDOSCOPY;  Service: Gastroenterology;  Laterality: N/A;   JOINT REPLACEMENT Bilateral    Hips   TUBAL LIGATION      Social History   Socioeconomic History   Marital status: Single    Spouse name: Not on file   Number of children: Not on file   Years of education: Not on file   Highest education level: Not on file  Occupational History   Not on file  Tobacco Use   Smoking status: Never   Smokeless tobacco: Never  Vaping Use   Vaping status: Never Used  Substance and Sexual Activity   Alcohol use: Not Currently    Alcohol/week: 2.0 standard drinks of alcohol    Types: 1 Cans of beer, 1 Standard drinks or equivalent per week   Drug use: No   Sexual activity: Yes  Other Topics Concern   Not on file  Social History Narrative   Not on file   Social Drivers of Health   Financial Resource Strain: Low Risk  (05/09/2021)   Received from [facility], [facility]   Overall Financial Resource Strain (CARDIA)    Difficulty of Paying Living Expenses: Not hard at all  Food Insecurity: No Food Insecurity (06/04/2023)   Hunger Vital Sign    Worried About Running Out of Food in the Last Year: Never true    Ran Out of Food in the Last Year: Never true  Transportation  Needs: No Transportation Needs (06/04/2023)   PRAPARE - Administrator, Civil Service (Medical): No    Lack of Transportation (Non-Medical): No  Physical Activity: Not on file  Stress: Not on file  Social Connections: Not on file  Intimate Partner Violence: Not At Risk (06/04/2023)   Humiliation, Afraid, Rape, and Kick questionnaire    Fear of Current or Ex-Partner: No    Emotionally Abused: No    Physically Abused: No    Sexually Abused: No    No family history on file.  Allergies  Allergen Reactions   Seasonal Ic [Cholestatin]     Outpatient Medications Prior to Visit  Medication Sig   albuterol (PROVENTIL HFA;VENTOLIN HFA) 108 (90 Base) MCG/ACT inhaler Inhale 2 puffs into the lungs every 6 (six) hours as needed for wheezing or shortness of breath.   amLODipine (NORVASC) 10 MG tablet Take 1 tablet (10 mg total) by mouth every morning.   atorvastatin (LIPITOR) 10 MG tablet TAKE 1 TABLET BY MOUTH IN THE  EVENING   busPIRone (BUSPAR) 10 MG tablet Take 10 mg by mouth 3 (three) times daily.   clobetasol (TEMOVATE) 0.05 % external solution Apply 1 Application topically daily. Mixed with Cera Ve (Patient not taking: Reported on 08/16/2023)   doxepin (SINEQUAN) 25 MG capsule Take 50  mg by mouth at bedtime.   fluticasone (FLONASE) 50 MCG/ACT nasal spray Place 1 spray into both nostrils daily.   glucose blood (ONETOUCH ULTRA TEST) test strip Use with device to check sugars twice daily   hydrOXYzine (ATARAX) 25 MG tablet Take 1 tablet (25 mg total) by mouth 3 (three) times daily as needed.   metFORMIN (GLUCOPHAGE) 500 MG tablet TAKE 1 TABLET BY MOUTH TWICE  DAILY WITH A MEAL   mirtazapine (REMERON) 7.5 MG tablet Take 7.5 mg by mouth at bedtime.   olmesartan (BENICAR) 20 MG tablet Take 1 tablet (20 mg total) by mouth daily.   OneTouch UltraSoft 2 Lancets MISC 1 Lancet by Does not apply route 2 (two) times daily.   pantoprazole (PROTONIX) 40 MG tablet Take 1 tablet (40 mg total) by  mouth daily.   predniSONE (DELTASONE) 5 MG tablet Take 1 tablet (5 mg total) by mouth daily with breakfast. (Patient not taking: Reported on 08/16/2023)   SYMBICORT 160-4.5 MCG/ACT inhaler Inhale 2 puffs into the lungs 2 (two) times daily.   tizanidine (ZANAFLEX) 2 MG capsule Take 2 mg by mouth 3 (three) times daily. (Patient not taking: Reported on 08/16/2023)   tranexamic acid (LYSTEDA) 650 MG TABS tablet Take 325 mg by mouth 2 (two) times daily.   triamcinolone ointment (KENALOG) 0.5 % APPLY TOPICALLY TO THE AFFECTED AREA TWICE DAILY (Patient not taking: Reported on 08/16/2023)   No facility-administered medications prior to visit.    Review of Systems  Constitutional:  Negative for weight loss.  HENT: Negative.    Eyes: Negative.   Respiratory: Negative.    Cardiovascular: Negative.   Gastrointestinal:  Positive for heartburn (resolved).  Genitourinary: Negative.   Skin: Negative.   Neurological: Negative.   Endo/Heme/Allergies: Negative.        Objective:   BP 126/75   Pulse 72   Ht 5\' 2"  (1.575 m)   Wt 171 lb 6.4 oz (77.7 kg)   LMP 06/16/2014 (Approximate)   SpO2 98%   BMI 31.35 kg/m   Vitals:   10/12/23 1349  BP: 126/75  Pulse: 72  Height: 5\' 2"  (1.575 m)  Weight: 171 lb 6.4 oz (77.7 kg)  SpO2: 98%  BMI (Calculated): 31.34    Physical Exam Vitals reviewed.  Constitutional:      General: She is not in acute distress.    Appearance: She is obese.  HENT:     Head: Normocephalic.     Nose: Nose normal.     Mouth/Throat:     Mouth: Mucous membranes are moist.  Eyes:     Extraocular Movements: Extraocular movements intact.     Pupils: Pupils are equal, round, and reactive to light.  Cardiovascular:     Rate and Rhythm: Normal rate and regular rhythm.     Heart sounds: No murmur heard. Pulmonary:     Effort: Pulmonary effort is normal.     Breath sounds: No rhonchi or rales.  Abdominal:     General: Abdomen is flat.     Palpations: There is no  hepatomegaly, splenomegaly or mass.  Musculoskeletal:        General: Normal range of motion.     Cervical back: Normal range of motion. No tenderness.  Skin:    Findings: Rash present. Rash is macular and papular.     Comments: Scratch marks  Neurological:     General: No focal deficit present.     Mental Status: She is alert and oriented to person,  place, and time.     Cranial Nerves: No cranial nerve deficit.     Motor: No weakness.  Psychiatric:        Mood and Affect: Mood normal.        Behavior: Behavior normal.      Results for orders placed or performed in visit on 10/12/23  POCT CBG (Fasting - Glucose)  Result Value Ref Range   Glucose Fasting, POC 99 70 - 99 mg/dL        Assessment & Plan:  As per problem list. Transitioned to pulse ppi Rx Problem List Items Addressed This Visit       Endocrine   Type 2 diabetes mellitus without complication, without long-term current use of insulin (HCC) - Primary   Relevant Orders   POCT CBG (Fasting - Glucose) (Completed)    No follow-ups on file.   Total time spent: 20 minutes  [name], MD  10/12/2023   This document may have been prepared by [facility] Voice Recognition software and as such may include unintentional dictation errors.

## 2023-11-02 ENCOUNTER — Other Ambulatory Visit: Payer: Self-pay | Admitting: Internal Medicine

## 2023-11-02 DIAGNOSIS — K219 Gastro-esophageal reflux disease without esophagitis: Secondary | ICD-10-CM

## 2023-11-02 DIAGNOSIS — I10 Essential (primary) hypertension: Secondary | ICD-10-CM

## 2023-11-03 ENCOUNTER — Other Ambulatory Visit: Payer: Self-pay

## 2023-11-03 ENCOUNTER — Other Ambulatory Visit: Payer: Self-pay | Admitting: Internal Medicine

## 2023-11-03 DIAGNOSIS — K219 Gastro-esophageal reflux disease without esophagitis: Secondary | ICD-10-CM

## 2023-12-28 ENCOUNTER — Ambulatory Visit: Payer: 59 | Admitting: Internal Medicine

## 2024-01-06 DIAGNOSIS — H524 Presbyopia: Secondary | ICD-10-CM | POA: Diagnosis not present

## 2024-01-06 DIAGNOSIS — E119 Type 2 diabetes mellitus without complications: Secondary | ICD-10-CM | POA: Diagnosis not present

## 2024-01-17 ENCOUNTER — Ambulatory Visit: Admitting: Internal Medicine

## 2024-01-21 ENCOUNTER — Other Ambulatory Visit

## 2024-01-21 DIAGNOSIS — E119 Type 2 diabetes mellitus without complications: Secondary | ICD-10-CM | POA: Diagnosis not present

## 2024-01-21 DIAGNOSIS — E7800 Pure hypercholesterolemia, unspecified: Secondary | ICD-10-CM | POA: Diagnosis not present

## 2024-01-22 LAB — HEMOGLOBIN A1C
Est. average glucose Bld gHb Est-mCnc: 114 mg/dL
Hgb A1c MFr Bld: 5.6 % (ref 4.8–5.6)

## 2024-01-22 LAB — LIPID PANEL
Chol/HDL Ratio: 1.8 ratio (ref 0.0–4.4)
Cholesterol, Total: 146 mg/dL (ref 100–199)
HDL: 81 mg/dL (ref >39)
LDL Chol Calc (NIH): 46 mg/dL (ref 0–99)
Triglycerides: 105 mg/dL (ref 0–149)
VLDL Cholesterol Cal: 19 mg/dL (ref 5–40)

## 2024-01-22 LAB — COMPREHENSIVE METABOLIC PANEL WITH GFR
ALT: 16 IU/L (ref 0–32)
AST: 18 IU/L (ref 0–40)
Albumin: 4.5 g/dL (ref 3.8–4.9)
Alkaline Phosphatase: 101 IU/L (ref 44–121)
BUN/Creatinine Ratio: 11 (ref 9–23)
BUN: 9 mg/dL (ref 6–24)
Bilirubin Total: 0.4 mg/dL (ref 0.0–1.2)
CO2: 20 mmol/L (ref 20–29)
Calcium: 9.7 mg/dL (ref 8.7–10.2)
Chloride: 104 mmol/L (ref 96–106)
Creatinine, Ser: 0.84 mg/dL (ref 0.57–1.00)
Globulin, Total: 2.9 g/dL (ref 1.5–4.5)
Glucose: 85 mg/dL (ref 70–99)
Potassium: 4 mmol/L (ref 3.5–5.2)
Sodium: 142 mmol/L (ref 134–144)
Total Protein: 7.4 g/dL (ref 6.0–8.5)
eGFR: 82 mL/min/{1.73_m2} (ref >59)

## 2024-01-26 ENCOUNTER — Ambulatory Visit: Admitting: Internal Medicine

## 2024-01-31 ENCOUNTER — Ambulatory Visit: Payer: Self-pay | Admitting: Internal Medicine

## 2024-01-31 NOTE — Progress Notes (Signed)
 Patient called.  Unable to reach patient. Wasn't able to leave voicemail.

## 2024-02-25 ENCOUNTER — Other Ambulatory Visit

## 2024-03-01 ENCOUNTER — Ambulatory Visit: Payer: Self-pay | Admitting: Internal Medicine

## 2024-03-01 ENCOUNTER — Ambulatory Visit: Admitting: Internal Medicine

## 2024-03-01 ENCOUNTER — Encounter: Payer: Self-pay | Admitting: Internal Medicine

## 2024-03-01 VITALS — BP 122/80 | HR 74 | Temp 97.0°F | Ht 62.0 in | Wt 169.2 lb

## 2024-03-01 DIAGNOSIS — K219 Gastro-esophageal reflux disease without esophagitis: Secondary | ICD-10-CM | POA: Diagnosis not present

## 2024-03-01 DIAGNOSIS — E119 Type 2 diabetes mellitus without complications: Secondary | ICD-10-CM

## 2024-03-01 DIAGNOSIS — Z0130 Encounter for examination of blood pressure without abnormal findings: Secondary | ICD-10-CM

## 2024-03-01 DIAGNOSIS — M660 Rupture of popliteal cyst: Secondary | ICD-10-CM

## 2024-03-01 DIAGNOSIS — E7800 Pure hypercholesterolemia, unspecified: Secondary | ICD-10-CM

## 2024-03-01 LAB — GLUCOSE, POCT (MANUAL RESULT ENTRY): POC Glucose: 74 mg/dL (ref 70–99)

## 2024-03-01 LAB — POC CREATINE & ALBUMIN,URINE
Albumin/Creatinine Ratio, Urine, POC: <30
Creatinine, POC: 100 mg/dL
Microalbumin Ur, POC: 10 mg/L

## 2024-03-01 MED ORDER — ATORVASTATIN CALCIUM 10 MG PO TABS
10.0000 mg | ORAL_TABLET | Freq: Every evening | ORAL | 0 refills | Status: DC
Start: 2024-03-01 — End: 2024-06-09

## 2024-03-01 MED ORDER — PREDNISONE 5 MG PO TABS: 5.0000 mg | ORAL_TABLET | Freq: Every day | ORAL | 0 refills | Status: DC

## 2024-03-01 MED ORDER — PANTOPRAZOLE SODIUM 40 MG PO TBEC
40.0000 mg | DELAYED_RELEASE_TABLET | Freq: Every day | ORAL | 0 refills | Status: DC
Start: 2024-03-01 — End: 2024-05-29

## 2024-03-01 MED ORDER — METFORMIN HCL 500 MG PO TABS
500.0000 mg | ORAL_TABLET | Freq: Two times a day (BID) | ORAL | 0 refills | Status: DC
Start: 2024-03-01 — End: 2024-04-21

## 2024-03-01 NOTE — Progress Notes (Signed)
 Established Patient Office Visit  Subjective:  Patient ID: Samantha Deleon, female    DOB: [birth year]  Age: 58 y.o. MRN: [id number]  Chief Complaint  Patient presents with   Follow-up    ROI-10 week lab results    C/o left calf pain and swelling x 2 wks, denies trauma and relieved by changing position of her leg while lying down. Also here for lab review and medication refills. Labs reviewed and notable for well controlled diabetes, A1c  at target, lipids at target with unremarkable cmp. Denies any hypoglycemic episodes and home bg readings have been at target.     No other concerns at this time.   Past Medical History:  Diagnosis Date   Asthma    Hypertension     Past Surgical History:  Procedure Laterality Date   COLONOSCOPY N/A 02/27/2022   Procedure: COLONOSCOPY;  Surgeon: Onita Elspeth Sharper, DO;  Location: [facility] ENDOSCOPY;  Service: Gastroenterology;  Laterality: N/A;   ESOPHAGOGASTRODUODENOSCOPY N/A 02/27/2022   Procedure: ESOPHAGOGASTRODUODENOSCOPY (EGD);  Surgeon: Onita Elspeth Sharper, DO;  Location: [facility] ENDOSCOPY;  Service: Gastroenterology;  Laterality: N/A;   JOINT REPLACEMENT Bilateral    Hips   TUBAL LIGATION      Social History   Socioeconomic History   Marital status: Single    Spouse name: Not on file   Number of children: Not on file   Years of education: Not on file   Highest education level: Not on file  Occupational History   Not on file  Tobacco Use   Smoking status: Never   Smokeless tobacco: Never  Vaping Use   Vaping status: Never Used  Substance and Sexual Activity   Alcohol use: Not Currently    Alcohol/week: 2.0 standard drinks of alcohol    Types: 1 Cans of beer, 1 Standard drinks or equivalent per week   Drug use: No   Sexual activity: Yes  Other Topics Concern   Not on file  Social History Narrative   Not on file   Social Drivers of Health   Financial Resource Strain: Low Risk  (05/09/2021)   Received from [facility]    Overall Financial Resource Strain (CARDIA)    Difficulty of Paying Living Expenses: Not hard at all  Food Insecurity: No Food Insecurity (06/04/2023)   Hunger Vital Sign    Worried About Running Out of Food in the Last Year: Never true    Ran Out of Food in the Last Year: Never true  Transportation Needs: No Transportation Needs (06/04/2023)   PRAPARE - Administrator, Civil Service (Medical): No    Lack of Transportation (Non-Medical): No  Physical Activity: Not on file  Stress: Not on file  Social Connections: Not on file  Intimate Partner Violence: Not At Risk (06/04/2023)   Humiliation, Afraid, Rape, and Kick questionnaire    Fear of Current or Ex-Partner: No    Emotionally Abused: No    Physically Abused: No    Sexually Abused: No    No family history on file.  Allergies  Allergen Reactions   Seasonal Ic [Cholestatin]     Outpatient Medications Prior to Visit  Medication Sig   albuterol  (PROVENTIL  HFA;VENTOLIN  HFA) 108 (90 Base) MCG/ACT inhaler Inhale 2 puffs into the lungs every 6 (six) hours as needed for wheezing or shortness of breath.   amLODipine  (NORVASC ) 10 MG tablet TAKE 1 TABLET(10 MG) BY MOUTH EVERY MORNING   busPIRone  (BUSPAR ) 10 MG tablet Take 10 mg  by mouth 3 (three) times daily.   clobetasol (TEMOVATE) 0.05 % external solution Apply 1 Application topically daily. Mixed with Cera Ve   doxepin (SINEQUAN) 25 MG capsule Take 50 mg by mouth at bedtime.   fluticasone  (FLONASE) 50 MCG/ACT nasal spray Place 1 spray into both nostrils daily.   glucose blood (ONETOUCH ULTRA TEST) test strip Use with device to check sugars twice daily   hydrOXYzine  (ATARAX ) 25 MG tablet Take 1 tablet (25 mg total) by mouth 3 (three) times daily as needed.   mirtazapine  (REMERON ) 7.5 MG tablet Take 7.5 mg by mouth at bedtime.   olmesartan  (BENICAR ) 20 MG tablet Take 1 tablet (20 mg total) by mouth daily.   OneTouch UltraSoft 2 Lancets MISC 1 Lancet by Does not apply route 2  (two) times daily.   SYMBICORT 160-4.5 MCG/ACT inhaler Inhale 2 puffs into the lungs 2 (two) times daily.   tranexamic acid (LYSTEDA) 650 MG TABS tablet Take 325 mg by mouth 2 (two) times daily.   [DISCONTINUED] atorvastatin  (LIPITOR) 10 MG tablet TAKE 1 TABLET BY MOUTH IN THE  EVENING   [DISCONTINUED] metFORMIN  (GLUCOPHAGE ) 500 MG tablet TAKE 1 TABLET BY MOUTH TWICE  DAILY WITH A MEAL   [DISCONTINUED] pantoprazole  (PROTONIX ) 40 MG tablet TAKE 1 TABLET(40 MG) BY MOUTH DAILY   tizanidine  (ZANAFLEX ) 2 MG capsule Take 2 mg by mouth 3 (three) times daily. (Patient not taking: Reported on 03/01/2024)   triamcinolone  ointment (KENALOG ) 0.5 % APPLY TOPICALLY TO THE AFFECTED AREA TWICE DAILY (Patient not taking: Reported on 03/01/2024)   [DISCONTINUED] predniSONE  (DELTASONE ) 5 MG tablet Take 1 tablet (5 mg total) by mouth daily with breakfast. (Patient not taking: Reported on 08/16/2023)   No facility-administered medications prior to visit.    Review of Systems  Constitutional:  Negative for weight loss.  HENT: Negative.    Eyes: Negative.   Respiratory: Negative.    Cardiovascular: Negative.   Gastrointestinal:  Positive for heartburn (resolved).  Genitourinary: Negative.   Skin: Negative.   Neurological: Negative.   Endo/Heme/Allergies: Negative.        Objective:   BP 122/80   Pulse 74   Temp (!) 97 F (36.1 C)   Ht 5' 2 (1.575 m)   Wt 169 lb 3.2 oz (76.7 kg)   LMP 06/16/2014 (Approximate)   SpO2 99%   BMI 30.95 kg/m   Vitals:   03/01/24 0942  BP: 122/80  Pulse: 74  Temp: (!) 97 F (36.1 C)  Height: 5' 2 (1.575 m)  Weight: 169 lb 3.2 oz (76.7 kg)  SpO2: 99%  BMI (Calculated): 30.94    Physical Exam Musculoskeletal:     Left lower leg: Swelling present. No tenderness. 1+ Edema present.     Comments:  Negative Homan'Merryl Buckels sign      Results for orders placed or performed in visit on 03/01/24  POCT Glucose (CBG)  Result Value Ref Range   POC Glucose 74 70 - 99 mg/dl     Recent Results (from the past 2160 hours)  Comprehensive metabolic panel     Status: None   Collection Time: 01/21/24 10:09 AM  Result Value Ref Range   Glucose 85 70 - 99 mg/dL   BUN 9 6 - 24 mg/dL   Creatinine, Ser 9.15 0.57 - 1.00 mg/dL   eGFR 82 >40 fO/fpw/8.26   BUN/Creatinine Ratio 11 9 - 23   Sodium 142 134 - 144 mmol/L   Potassium 4.0 3.5 - 5.2 mmol/L  Chloride 104 96 - 106 mmol/L   CO2 20 20 - 29 mmol/L   Calcium  9.7 8.7 - 10.2 mg/dL   Total Protein 7.4 6.0 - 8.5 g/dL   Albumin 4.5 3.8 - 4.9 g/dL   Globulin, Total 2.9 1.5 - 4.5 g/dL   Bilirubin Total 0.4 0.0 - 1.2 mg/dL   Alkaline Phosphatase 101 44 - 121 IU/L   AST 18 0 - 40 IU/L   ALT 16 0 - 32 IU/L  Lipid panel     Status: None   Collection Time: 01/21/24 10:09 AM  Result Value Ref Range   Cholesterol, Total 146 100 - 199 mg/dL   Triglycerides 894 0 - 149 mg/dL   HDL 81 >60 mg/dL   VLDL Cholesterol Cal 19 5 - 40 mg/dL   LDL Chol Calc (NIH) 46 0 - 99 mg/dL   Chol/HDL Ratio 1.8 0.0 - 4.4 ratio    Comment:                                   T. Chol/HDL Ratio                                             Men  Women                               1/2 Avg.Risk  3.4    3.3                                   Avg.Risk  5.0    4.4                                2X Avg.Risk  9.6    7.1                                3X Avg.Risk 23.4   11.0   Hemoglobin A1c     Status: None   Collection Time: 01/21/24 10:09 AM  Result Value Ref Range   Hgb A1c MFr Bld 5.6 4.8 - 5.6 %    Comment:          Prediabetes: 5.7 - 6.4          Diabetes: >6.4          Glycemic control for adults with diabetes: <7.0    Est. average glucose Bld gHb Est-mCnc 114 mg/dL  POCT Glucose (CBG)     Status: Normal   Collection Time: 03/01/24  9:49 AM  Result Value Ref Range   POC Glucose 74 70 - 99 mg/dl      Assessment & Plan:  As per problem list. Problem List Items Addressed This Visit       Endocrine   Type 2 diabetes mellitus without  complication, without long-term current use of insulin (HCC) - Primary   Relevant Medications   metFORMIN  (GLUCOPHAGE ) 500 MG tablet   atorvastatin  (LIPITOR) 10 MG tablet   Other Relevant Orders   POCT Glucose (CBG) (Completed)   Other Visit Diagnoses  Gastroesophageal reflux disease, unspecified whether esophagitis present       Relevant Medications   pantoprazole  (PROTONIX ) 40 MG tablet     Pure hypercholesterolemia       Relevant Medications   atorvastatin  (LIPITOR) 10 MG tablet     B[place] cyst, ruptured       Relevant Orders   US  Venous Img Lower Unilateral Left       Return in about 8 weeks (around 04/26/2024) for fu with labs prior.   Total time spent: 30 minutes  Sherrill Cinderella Perry, MD  03/01/2024   This document may have been prepared by [facility] Voice Recognition software and as such may include unintentional dictation errors.

## 2024-03-08 ENCOUNTER — Other Ambulatory Visit

## 2024-03-15 ENCOUNTER — Other Ambulatory Visit

## 2024-03-28 ENCOUNTER — Other Ambulatory Visit

## 2024-04-20 ENCOUNTER — Other Ambulatory Visit: Payer: Self-pay | Admitting: Internal Medicine

## 2024-04-20 DIAGNOSIS — E119 Type 2 diabetes mellitus without complications: Secondary | ICD-10-CM

## 2024-04-26 ENCOUNTER — Ambulatory Visit: Admitting: Internal Medicine

## 2024-05-29 ENCOUNTER — Ambulatory Visit (INDEPENDENT_AMBULATORY_CARE_PROVIDER_SITE_OTHER): Admitting: Internal Medicine

## 2024-05-29 ENCOUNTER — Encounter: Payer: Self-pay | Admitting: Internal Medicine

## 2024-05-29 VITALS — BP 122/86 | HR 70 | Temp 97.9°F | Ht 62.0 in | Wt 172.4 lb

## 2024-05-29 DIAGNOSIS — K219 Gastro-esophageal reflux disease without esophagitis: Secondary | ICD-10-CM | POA: Diagnosis not present

## 2024-05-29 DIAGNOSIS — E119 Type 2 diabetes mellitus without complications: Secondary | ICD-10-CM | POA: Diagnosis not present

## 2024-05-29 DIAGNOSIS — I10 Essential (primary) hypertension: Secondary | ICD-10-CM

## 2024-05-29 DIAGNOSIS — E7800 Pure hypercholesterolemia, unspecified: Secondary | ICD-10-CM

## 2024-05-29 LAB — POCT CBG (FASTING - GLUCOSE)-MANUAL ENTRY: Glucose Fasting, POC: 71 mg/dL (ref 70–99)

## 2024-05-29 MED ORDER — PANTOPRAZOLE SODIUM 40 MG PO TBEC: 40.0000 mg | DELAYED_RELEASE_TABLET | Freq: Every day | ORAL | 0 refills | Status: AC

## 2024-05-29 MED ORDER — AMLODIPINE-OLMESARTAN 10-20 MG PO TABS: 1.0000 | ORAL_TABLET | Freq: Every day | ORAL | 0 refills | Status: DC

## 2024-05-29 NOTE — Progress Notes (Signed)
 Established Patient Office Visit  Subjective:  Patient ID: Samantha Deleon, female    DOB: 10/16/1965  Age: 58 y.o. MRN: [id number]  Chief Complaint  Patient presents with   Follow-up    8 week follow up    No new complaints, here for lab review and medication refills. Failed to have previsit labs done. Denies any hypoglycemic episodes and home bg readings have been at target.     No other concerns at this time.   Past Medical History:  Diagnosis Date   Asthma    Hypertension     Past Surgical History:  Procedure Laterality Date   COLONOSCOPY N/A 02/27/2022   Procedure: COLONOSCOPY;  Surgeon: Onita Elspeth Sharper, DO;  Location: [facility] ENDOSCOPY;  Service: Gastroenterology;  Laterality: N/A;   ESOPHAGOGASTRODUODENOSCOPY N/A 02/27/2022   Procedure: ESOPHAGOGASTRODUODENOSCOPY (EGD);  Surgeon: Onita Elspeth Sharper, DO;  Location: [facility] ENDOSCOPY;  Service: Gastroenterology;  Laterality: N/A;   JOINT REPLACEMENT Bilateral    Hips   TUBAL LIGATION      Social History   Socioeconomic History   Marital status: Single    Spouse name: Not on file   Number of children: Not on file   Years of education: Not on file   Highest education level: Not on file  Occupational History   Not on file  Tobacco Use   Smoking status: Never   Smokeless tobacco: Never  Vaping Use   Vaping status: Never Used  Substance and Sexual Activity   Alcohol use: Not Currently    Alcohol/week: 2.0 standard drinks of alcohol    Types: 1 Cans of beer, 1 Standard drinks or equivalent per week   Drug use: No   Sexual activity: Yes  Other Topics Concern   Not on file  Social History Narrative   Not on file   Social Drivers of Health   Financial Resource Strain: Low Risk  (05/09/2021)   Received from [facility]   Overall Financial Resource Strain (CARDIA)    Difficulty of Paying Living Expenses: Not hard at all  Food Insecurity: No Food Insecurity (06/04/2023)   Hunger Vital Sign    Worried  About Running Out of Food in the Last Year: Never true    Ran Out of Food in the Last Year: Never true  Transportation Needs: No Transportation Needs (06/04/2023)   PRAPARE - Administrator, Civil Service (Medical): No    Lack of Transportation (Non-Medical): No  Physical Activity: Not on file  Stress: Not on file  Social Connections: Not on file  Intimate Partner Violence: Not At Risk (06/04/2023)   Humiliation, Afraid, Rape, and Kick questionnaire    Fear of Current or Ex-Partner: No    Emotionally Abused: No    Physically Abused: No    Sexually Abused: No    No family history on file.  Allergies  Allergen Reactions   Seasonal Ic [Cholestatin]     Outpatient Medications Prior to Visit  Medication Sig   albuterol  (PROVENTIL  HFA;VENTOLIN  HFA) 108 (90 Base) MCG/ACT inhaler Inhale 2 puffs into the lungs every 6 (six) hours as needed for wheezing or shortness of breath.   amLODipine  (NORVASC ) 10 MG tablet TAKE 1 TABLET(10 MG) BY MOUTH EVERY MORNING   atorvastatin  (LIPITOR) 10 MG tablet Take 1 tablet (10 mg total) by mouth every evening.   busPIRone  (BUSPAR ) 10 MG tablet Take 10 mg by mouth 3 (three) times daily.   clobetasol (TEMOVATE) 0.05 % external solution Apply  1 Application topically daily. Mixed with Cera Ve   doxepin (SINEQUAN) 25 MG capsule Take 50 mg by mouth at bedtime.   fluticasone  (FLONASE) 50 MCG/ACT nasal spray Place 1 spray into both nostrils daily.   glucose blood (ONETOUCH ULTRA TEST) test strip Use with device to check sugars twice daily   hydrOXYzine  (ATARAX ) 25 MG tablet Take 1 tablet (25 mg total) by mouth 3 (three) times daily as needed.   metFORMIN  (GLUCOPHAGE ) 500 MG tablet TAKE 1 TABLET(500 MG) BY MOUTH TWICE DAILY WITH A MEAL   mirtazapine  (REMERON ) 7.5 MG tablet Take 7.5 mg by mouth at bedtime.   olmesartan  (BENICAR ) 20 MG tablet Take 1 tablet (20 mg total) by mouth daily.   Pseudoephedrine-Acetaminophen  (SM NON-ASPRIN SINUS PO) Take 5 mg by  mouth as needed.   SYMBICORT 160-4.5 MCG/ACT inhaler Inhale 2 puffs into the lungs 2 (two) times daily.   tranexamic acid (LYSTEDA) 650 MG TABS tablet Take 325 mg by mouth 2 (two) times daily.   [DISCONTINUED] pantoprazole  (PROTONIX ) 40 MG tablet Take 1 tablet (40 mg total) by mouth daily.   tizanidine  (ZANAFLEX ) 2 MG capsule Take 2 mg by mouth 3 (three) times daily. (Patient not taking: Reported on 05/29/2024)   triamcinolone  ointment (KENALOG ) 0.5 % APPLY TOPICALLY TO THE AFFECTED AREA TWICE DAILY (Patient not taking: Reported on 05/29/2024)   No facility-administered medications prior to visit.    Review of Systems  Constitutional:  Negative for weight loss (gained 3 lbs).  HENT: Negative.    Eyes: Negative.   Respiratory: Negative.    Cardiovascular: Negative.   Gastrointestinal:  Positive for heartburn (resolved).  Genitourinary: Negative.   Skin: Negative.   Neurological: Negative.   Endo/Heme/Allergies: Negative.        Objective:   BP 122/86   Pulse 70   Temp 97.9 F (36.6 C)   Ht 5' 2 (1.575 m)   Wt 172 lb 6.4 oz (78.2 kg)   LMP 06/16/2014 (Approximate)   SpO2 99%   BMI 31.53 kg/m   Vitals:   05/29/24 1542  BP: 122/86  Pulse: 70  Temp: 97.9 F (36.6 C)  Height: 5' 2 (1.575 m)  Weight: 172 lb 6.4 oz (78.2 kg)  SpO2: 99%  BMI (Calculated): 31.52    Physical Exam Vitals reviewed.  Constitutional:      General: Samantha Deleon is not in acute distress.    Appearance: Samantha Deleon is obese.  HENT:     Head: Normocephalic.     Nose: Nose normal.     Mouth/Throat:     Mouth: Mucous membranes are moist.  Eyes:     Extraocular Movements: Extraocular movements intact.     Pupils: Pupils are equal, round, and reactive to light.  Cardiovascular:     Rate and Rhythm: Normal rate and regular rhythm.     Heart sounds: No murmur heard. Pulmonary:     Effort: Pulmonary effort is normal.     Breath sounds: No rhonchi or rales.  Abdominal:     General: Abdomen is flat.      Palpations: There is no hepatomegaly, splenomegaly or mass.  Musculoskeletal:        General: Normal range of motion.     Cervical back: Normal range of motion. No tenderness.  Skin:    Findings: Rash present. Rash is macular and papular.     Comments: Scratch marks  Neurological:     General: No focal deficit present.     Mental Status: Samantha Deleon  is alert and oriented to person, place, and time.     Cranial Nerves: No cranial nerve deficit.     Motor: No weakness.  Psychiatric:        Mood and Affect: Mood normal.        Behavior: Behavior normal.      Results for orders placed or performed in visit on 05/29/24  POCT CBG (Fasting - Glucose)  Result Value Ref Range   Glucose Fasting, POC 71 70 - 99 mg/dL    Recent Results (from the past 2160 hours)  POCT Glucose (CBG)     Status: Normal   Collection Time: 03/01/24  9:49 AM  Result Value Ref Range   POC Glucose 74 70 - 99 mg/dl  POC CREATINE & ALBUMIN,URINE     Status: Normal   Collection Time: 03/01/24 10:16 AM  Result Value Ref Range   Microalbumin Ur, POC 10 mg/L   Creatinine, POC 100 mg/dL   Albumin/Creatinine Ratio, Urine, POC <30   POCT CBG (Fasting - Glucose)     Status: Normal   Collection Time: 05/29/24  3:48 PM  Result Value Ref Range   Glucose Fasting, POC 71 70 - 99 mg/dL      Assessment & Plan:  Samantha Deleon was seen today for follow-up.  Type 2 diabetes mellitus without complication, without long-term current use of insulin (HCC) -     POCT CBG (Fasting - Glucose) -     Hemoglobin A1c -     Lipid panel  Primary hypertension  Pure hypercholesterolemia  Gastroesophageal reflux disease, unspecified whether esophagitis present -     Pantoprazole  Sodium; Take 1 tablet (40 mg total) by mouth daily.  Dispense: 90 tablet; Refill: 0  Other orders -     amLODIPine -Olmesartan ; Take 1 tablet by mouth daily.  Dispense: 90 tablet; Refill: 0    Problem List Items Addressed This Visit       Cardiovascular and  Mediastinum   Hypertension   Relevant Medications   amlodipine -olmesartan  (AZOR) 10-20 MG tablet     Endocrine   Type 2 diabetes mellitus without complication, without long-term current use of insulin (HCC) - Primary   Relevant Medications   amlodipine -olmesartan  (AZOR) 10-20 MG tablet   Other Relevant Orders   POCT CBG (Fasting - Glucose) (Completed)   Hemoglobin A1c   Lipid panel   Other Visit Diagnoses       Pure hypercholesterolemia       Relevant Medications   amlodipine -olmesartan  (AZOR) 10-20 MG tablet     Gastroesophageal reflux disease, unspecified whether esophagitis present       Relevant Medications   pantoprazole  (PROTONIX ) 40 MG tablet       Return in about 2 weeks (around 06/12/2024) for lab results.   Total time spent: 20 minutes  Sherrill Cinderella Perry, MD  05/29/2024   This document may have been prepared by [facility] Voice Recognition software and as such may include unintentional dictation errors.

## 2024-05-30 LAB — LIPID PANEL
Chol/HDL Ratio: 2 ratio (ref 0.0–4.4)
Cholesterol, Total: 163 mg/dL (ref 100–199)
HDL: 81 mg/dL
LDL Chol Calc (NIH): 50 mg/dL (ref 0–99)
Triglycerides: 204 mg/dL — ABNORMAL HIGH (ref 0–149)
VLDL Cholesterol Cal: 32 mg/dL (ref 5–40)

## 2024-05-30 LAB — HEMOGLOBIN A1C
Est. average glucose Bld gHb Est-mCnc: 105 mg/dL
Hgb A1c MFr Bld: 5.3 % (ref 4.8–5.6)

## 2024-06-09 ENCOUNTER — Other Ambulatory Visit: Payer: Self-pay | Admitting: Internal Medicine

## 2024-06-09 DIAGNOSIS — E7800 Pure hypercholesterolemia, unspecified: Secondary | ICD-10-CM

## 2024-06-16 ENCOUNTER — Encounter: Admitting: Internal Medicine

## 2024-06-20 ENCOUNTER — Encounter: Admitting: Internal Medicine

## 2024-06-27 ENCOUNTER — Encounter: Payer: Self-pay | Admitting: Internal Medicine

## 2024-06-27 ENCOUNTER — Ambulatory Visit (INDEPENDENT_AMBULATORY_CARE_PROVIDER_SITE_OTHER): Admitting: Internal Medicine

## 2024-06-27 ENCOUNTER — Ambulatory Visit: Payer: Self-pay | Admitting: Internal Medicine

## 2024-06-27 VITALS — BP 126/78 | HR 69 | Temp 96.9°F | Ht 62.0 in | Wt 171.0 lb

## 2024-06-27 DIAGNOSIS — Z0001 Encounter for general adult medical examination with abnormal findings: Secondary | ICD-10-CM | POA: Diagnosis not present

## 2024-06-27 DIAGNOSIS — F32A Depression, unspecified: Secondary | ICD-10-CM | POA: Diagnosis not present

## 2024-06-27 DIAGNOSIS — G4709 Other insomnia: Secondary | ICD-10-CM | POA: Diagnosis not present

## 2024-06-27 DIAGNOSIS — F419 Anxiety disorder, unspecified: Secondary | ICD-10-CM | POA: Insufficient documentation

## 2024-06-27 DIAGNOSIS — I10 Essential (primary) hypertension: Secondary | ICD-10-CM

## 2024-06-27 DIAGNOSIS — E119 Type 2 diabetes mellitus without complications: Secondary | ICD-10-CM

## 2024-06-27 DIAGNOSIS — E1165 Type 2 diabetes mellitus with hyperglycemia: Secondary | ICD-10-CM

## 2024-06-27 LAB — POCT CBG (FASTING - GLUCOSE)-MANUAL ENTRY: Glucose Fasting, POC: 95 mg/dL (ref 70–99)

## 2024-06-27 MED ORDER — ONETOUCH ULTRA TEST VI STRP: ORAL_STRIP | 12 refills | Status: AC

## 2024-06-27 MED ORDER — TRAZODONE HCL 50 MG PO TABS: 50.0000 mg | ORAL_TABLET | Freq: Every evening | ORAL | 2 refills | Status: DC | PRN

## 2024-06-27 MED ORDER — METFORMIN HCL ER 750 MG PO TB24: 750.0000 mg | ORAL_TABLET | Freq: Every day | ORAL | 0 refills | Status: DC

## 2024-06-27 NOTE — Progress Notes (Signed)
 Established Patient Office Visit  Subjective:  Patient ID: Samantha Deleon, female    DOB: [birth year]  Age: 58 y.o. MRN: [id number]  Chief Complaint  Patient presents with   Annual Exam    CPE 2 weeks lab results    Here for CPE, lab review and medication refills. C/o feeling depressed due to family issues.      No other concerns at this time.   Past Medical History:  Diagnosis Date   Asthma    Hypertension     Past Surgical History:  Procedure Laterality Date   COLONOSCOPY N/A 02/27/2022   Procedure: COLONOSCOPY;  Surgeon: Onita Elspeth Sharper, DO;  Location: [facility] ENDOSCOPY;  Service: Gastroenterology;  Laterality: N/A;   ESOPHAGOGASTRODUODENOSCOPY N/A 02/27/2022   Procedure: ESOPHAGOGASTRODUODENOSCOPY (EGD);  Surgeon: Onita Elspeth Sharper, DO;  Location: [facility] ENDOSCOPY;  Service: Gastroenterology;  Laterality: N/A;   JOINT REPLACEMENT Bilateral    Hips   TUBAL LIGATION      Social History   Socioeconomic History   Marital status: Single    Spouse name: Not on file   Number of children: Not on file   Years of education: Not on file   Highest education level: Not on file  Occupational History   Not on file  Tobacco Use   Smoking status: Never   Smokeless tobacco: Never  Vaping Use   Vaping status: Never Used  Substance and Sexual Activity   Alcohol use: Not Currently    Alcohol/week: 2.0 standard drinks of alcohol    Types: 1 Cans of beer, 1 Standard drinks or equivalent per week   Drug use: No   Sexual activity: Yes  Other Topics Concern   Not on file  Social History Narrative   Not on file   Social Drivers of Health   Financial Resource Strain: Low Risk  (05/09/2021)   Received from [facility]   Overall Financial Resource Strain (CARDIA)    Difficulty of Paying Living Expenses: Not hard at all  Food Insecurity: No Food Insecurity (06/04/2023)   Hunger Vital Sign    Worried About Running Out of Food in the Last Year: Never true    Ran Out of  Food in the Last Year: Never true  Transportation Needs: No Transportation Needs (06/04/2023)   PRAPARE - Administrator, Civil Service (Medical): No    Lack of Transportation (Non-Medical): No  Physical Activity: Not on file  Stress: Not on file  Social Connections: Not on file  Intimate Partner Violence: Not At Risk (06/04/2023)   Humiliation, Afraid, Rape, and Kick questionnaire    Fear of Current or Ex-Partner: No    Emotionally Abused: No    Physically Abused: No    Sexually Abused: No    History reviewed. No pertinent family history.  Allergies  Allergen Reactions   Seasonal Ic [Cholestatin]     Outpatient Medications Prior to Visit  Medication Sig   albuterol  (PROVENTIL  HFA;VENTOLIN  HFA) 108 (90 Base) MCG/ACT inhaler Inhale 2 puffs into the lungs every 6 (six) hours as needed for wheezing or shortness of breath.   amlodipine -olmesartan  (AZOR) 10-20 MG tablet Take 1 tablet by mouth daily.   atorvastatin  (LIPITOR) 10 MG tablet TAKE 1 TABLET(10 MG) BY MOUTH EVERY EVENING   busPIRone  (BUSPAR ) 10 MG tablet Take 10 mg by mouth 3 (three) times daily.   clobetasol (TEMOVATE) 0.05 % external solution Apply 1 Application topically daily. Mixed with Cera Ve   doxepin (SINEQUAN) 25  MG capsule Take 50 mg by mouth at bedtime.   fluticasone  (FLONASE) 50 MCG/ACT nasal spray Place 1 spray into both nostrils daily.   glucose blood (ONETOUCH ULTRA TEST) test strip Use with device to check sugars twice daily   hydrOXYzine  (ATARAX ) 25 MG tablet Take 1 tablet (25 mg total) by mouth 3 (three) times daily as needed.   mirtazapine  (REMERON ) 7.5 MG tablet Take 7.5 mg by mouth at bedtime.   pantoprazole  (PROTONIX ) 40 MG tablet Take 1 tablet (40 mg total) by mouth daily.   Pseudoephedrine-Acetaminophen  (SM NON-ASPRIN SINUS PO) Take 5 mg by mouth as needed.   SYMBICORT 160-4.5 MCG/ACT inhaler Inhale 2 puffs into the lungs 2 (two) times daily.   tranexamic acid (LYSTEDA) 650 MG TABS tablet  Take 325 mg by mouth 2 (two) times daily.   [DISCONTINUED] amLODipine  (NORVASC ) 10 MG tablet TAKE 1 TABLET(10 MG) BY MOUTH EVERY MORNING   [DISCONTINUED] metFORMIN  (GLUCOPHAGE ) 500 MG tablet TAKE 1 TABLET(500 MG) BY MOUTH TWICE DAILY WITH A MEAL   [DISCONTINUED] olmesartan  (BENICAR ) 20 MG tablet Take 1 tablet (20 mg total) by mouth daily.   [DISCONTINUED] tizanidine  (ZANAFLEX ) 2 MG capsule Take 2 mg by mouth 3 (three) times daily. (Patient not taking: Reported on 05/29/2024)   [DISCONTINUED] triamcinolone  ointment (KENALOG ) 0.5 % APPLY TOPICALLY TO THE AFFECTED AREA TWICE DAILY (Patient not taking: Reported on 05/29/2024)   No facility-administered medications prior to visit.    Review of Systems  Constitutional:  Positive for weight loss (1 lb).  Psychiatric/Behavioral:  Positive for depression. The patient is nervous/anxious and has insomnia.        Objective:   BP 126/78   Pulse 69   Temp (!) 96.9 F (36.1 C) (Tympanic)   Ht 5' 2 (1.575 m)   Wt 171 lb (77.6 kg)   LMP 06/16/2014 (Approximate)   SpO2 97%   BMI 31.28 kg/m   Vitals:   06/27/24 1309  BP: 126/78  Pulse: 69  Temp: (!) 96.9 F (36.1 C)  Height: 5' 2 (1.575 m)  Weight: 171 lb (77.6 kg)  SpO2: 97%  TempSrc: Tympanic  BMI (Calculated): 31.27    Physical Exam Vitals reviewed.  Constitutional:      General: She is not in acute distress.    Appearance: She is obese.  HENT:     Head: Normocephalic.     Nose: Nose normal.     Mouth/Throat:     Mouth: Mucous membranes are moist.  Eyes:     Extraocular Movements: Extraocular movements intact.     Pupils: Pupils are equal, round, and reactive to light.  Cardiovascular:     Rate and Rhythm: Normal rate and regular rhythm.     Heart sounds: No murmur heard. Pulmonary:     Effort: Pulmonary effort is normal.     Breath sounds: No rhonchi or rales.  Abdominal:     General: Abdomen is flat.     Palpations: There is no hepatomegaly, splenomegaly or mass.   Musculoskeletal:        General: Normal range of motion.     Cervical back: Normal range of motion. No tenderness.  Skin:    Comments: Scratch marks  Neurological:     General: No focal deficit present.     Mental Status: She is alert and oriented to person, place, and time.     Cranial Nerves: No cranial nerve deficit.     Motor: No weakness.  Psychiatric:  Mood and Affect: Mood normal.        Behavior: Behavior normal.      Results for orders placed or performed in visit on 06/27/24  POCT CBG (Fasting - Glucose)  Result Value Ref Range   Glucose Fasting, POC 95 70 - 99 mg/dL    Recent Results (from the past 2160 hours)  POCT CBG (Fasting - Glucose)     Status: Normal   Collection Time: 05/29/24  3:48 PM  Result Value Ref Range   Glucose Fasting, POC 71 70 - 99 mg/dL  Hemoglobin J8r     Status: None   Collection Time: 05/29/24  4:07 PM  Result Value Ref Range   Hgb A1c MFr Bld 5.3 4.8 - 5.6 %    Comment:          Prediabetes: 5.7 - 6.4          Diabetes: >6.4          Glycemic control for adults with diabetes: <7.0    Est. average glucose Bld gHb Est-mCnc 105 mg/dL  Lipid panel     Status: Abnormal   Collection Time: 05/29/24  4:07 PM  Result Value Ref Range   Cholesterol, Total 163 100 - 199 mg/dL   Triglycerides 795 (H) 0 - 149 mg/dL   HDL 81 >60 mg/dL   VLDL Cholesterol Cal 32 5 - 40 mg/dL   LDL Chol Calc (NIH) 50 0 - 99 mg/dL   Chol/HDL Ratio 2.0 0.0 - 4.4 ratio    Comment:                                   T. Chol/HDL Ratio                                             Men  Women                               1/2 Avg.Risk  3.4    3.3                                   Avg.Risk  5.0    4.4                                2X Avg.Risk  9.6    7.1                                3X Avg.Risk 23.4   11.0   POCT CBG (Fasting - Glucose)     Status: Normal   Collection Time: 06/27/24  1:14 PM  Result Value Ref Range   Glucose Fasting, POC 95 70 - 99 mg/dL    Advised to discuss changing ssri to Escitalopram with her psychiatrist.   Assessment & Plan:  Samantha Deleon was seen today for annual exam.  Type 2 diabetes mellitus with hyperglycemia, unspecified whether long term insulin use (HCC) -     POCT CBG (Fasting - Glucose) -     metFORMIN  HCl ER; Take 1 tablet (  750 mg total) by mouth daily with breakfast.  Dispense: 90 tablet; Refill: 0  Encounter for general adult medical examination with abnormal findings  Primary hypertension  Type 2 diabetes mellitus without complication, without long-term current use of insulin (HCC)    Problem List Items Addressed This Visit       Cardiovascular and Mediastinum   Hypertension     Endocrine   Type 2 diabetes mellitus without complication, without long-term current use of insulin (HCC)   Relevant Medications   metFORMIN  (GLUCOPHAGE -XR) 750 MG 24 hr tablet   Other Visit Diagnoses       Type 2 diabetes mellitus with hyperglycemia, unspecified whether long term insulin use (HCC)    -  Primary   Relevant Medications   metFORMIN  (GLUCOPHAGE -XR) 750 MG 24 hr tablet   Other Relevant Orders   POCT CBG (Fasting - Glucose) (Completed)     Encounter for general adult medical examination with abnormal findings           Return in about 3 months (around 09/27/2024) for fu with labs prior.   Total time spent: 45 minutes. This time includes review of previous notes and results and patient face to face interaction during today'Evaline Waltman visit.    Sherrill Cinderella Perry, MD  06/27/2024   This document may have been prepared by [facility] Voice Recognition software and as such may include unintentional dictation errors.

## 2024-06-29 ENCOUNTER — Encounter: Payer: Self-pay | Admitting: Emergency Medicine

## 2024-06-29 ENCOUNTER — Other Ambulatory Visit: Payer: Self-pay

## 2024-06-29 ENCOUNTER — Inpatient Hospital Stay
Admission: AD | Admit: 2024-06-29 | Discharge: 2024-07-05 | DRG: 885 | Disposition: A | Discharge status: Home / Self Care | Source: Intra-hospital | Attending: Psychiatry | Admitting: Psychiatry

## 2024-06-29 ENCOUNTER — Emergency Department (HOSPITAL_COMMUNITY)
Admission: EM | Admit: 2024-06-29 | Discharge: 2024-06-29 | Disposition: A | Discharge status: Other Acute Inpatient Hospital | Source: Home / Self Care

## 2024-06-29 DIAGNOSIS — Z1152 Encounter for screening for COVID-19: Secondary | ICD-10-CM | POA: Diagnosis not present

## 2024-06-29 DIAGNOSIS — F3132 Bipolar disorder, current episode depressed, moderate: Secondary | ICD-10-CM | POA: Diagnosis present

## 2024-06-29 DIAGNOSIS — R45851 Suicidal ideations: Secondary | ICD-10-CM | POA: Diagnosis present

## 2024-06-29 DIAGNOSIS — Z9152 Personal history of nonsuicidal self-harm: Secondary | ICD-10-CM

## 2024-06-29 DIAGNOSIS — F3130 Bipolar disorder, current episode depressed, mild or moderate severity, unspecified: Principal | ICD-10-CM | POA: Diagnosis present

## 2024-06-29 DIAGNOSIS — Y907 Blood alcohol level of 200-239 mg/100 ml: Secondary | ICD-10-CM | POA: Diagnosis present

## 2024-06-29 DIAGNOSIS — Z818 Family history of other mental and behavioral disorders: Secondary | ICD-10-CM | POA: Diagnosis not present

## 2024-06-29 DIAGNOSIS — Z9151 Personal history of suicidal behavior: Secondary | ICD-10-CM | POA: Diagnosis not present

## 2024-06-29 DIAGNOSIS — J45909 Unspecified asthma, uncomplicated: Secondary | ICD-10-CM | POA: Diagnosis present

## 2024-06-29 DIAGNOSIS — F10129 Alcohol abuse with intoxication, unspecified: Secondary | ICD-10-CM | POA: Diagnosis present

## 2024-06-29 DIAGNOSIS — F419 Anxiety disorder, unspecified: Secondary | ICD-10-CM | POA: Diagnosis present

## 2024-06-29 DIAGNOSIS — F10929 Alcohol use, unspecified with intoxication, unspecified: Secondary | ICD-10-CM

## 2024-06-29 DIAGNOSIS — Z888 Allergy status to other drugs, medicaments and biological substances status: Secondary | ICD-10-CM

## 2024-06-29 DIAGNOSIS — Z9851 Tubal ligation status: Secondary | ICD-10-CM

## 2024-06-29 DIAGNOSIS — F29 Unspecified psychosis not due to a substance or known physiological condition: Secondary | ICD-10-CM | POA: Insufficient documentation

## 2024-06-29 DIAGNOSIS — Z7984 Long term (current) use of oral hypoglycemic drugs: Secondary | ICD-10-CM

## 2024-06-29 DIAGNOSIS — Z79899 Other long term (current) drug therapy: Secondary | ICD-10-CM

## 2024-06-29 DIAGNOSIS — I10 Essential (primary) hypertension: Secondary | ICD-10-CM | POA: Diagnosis present

## 2024-06-29 DIAGNOSIS — R45850 Homicidal ideations: Secondary | ICD-10-CM | POA: Diagnosis present

## 2024-06-29 DIAGNOSIS — M542 Cervicalgia: Secondary | ICD-10-CM | POA: Diagnosis present

## 2024-06-29 DIAGNOSIS — R630 Anorexia: Secondary | ICD-10-CM | POA: Diagnosis present

## 2024-06-29 DIAGNOSIS — E119 Type 2 diabetes mellitus without complications: Secondary | ICD-10-CM | POA: Diagnosis present

## 2024-06-29 DIAGNOSIS — F10920 Alcohol use, unspecified with intoxication, uncomplicated: Secondary | ICD-10-CM | POA: Insufficient documentation

## 2024-06-29 DIAGNOSIS — F319 Bipolar disorder, unspecified: Principal | ICD-10-CM | POA: Diagnosis present

## 2024-06-29 LAB — CBC
HCT: 43.1 % (ref 36.0–46.0)
Hemoglobin: 14.1 g/dL (ref 12.0–15.0)
MCH: 28.7 pg (ref 26.0–34.0)
MCHC: 32.7 g/dL (ref 30.0–36.0)
MCV: 87.8 fL (ref 80.0–100.0)
Platelets: 302 K/uL (ref 150–400)
RBC: 4.91 MIL/uL (ref 3.87–5.11)
RDW: 12.9 % (ref 11.5–15.5)
WBC: 9.6 K/uL (ref 4.0–10.5)
nRBC: 0 % (ref 0.0–0.2)

## 2024-06-29 LAB — COMPREHENSIVE METABOLIC PANEL WITH GFR
ALT: 19 U/L (ref 0–44)
AST: 28 U/L (ref 15–41)
Albumin: 4.7 g/dL (ref 3.5–5.0)
Alkaline Phosphatase: 96 U/L (ref 38–126)
Anion gap: 17 — ABNORMAL HIGH (ref 5–15)
BUN: 16 mg/dL (ref 6–20)
CO2: 19 mmol/L — ABNORMAL LOW (ref 22–32)
Calcium: 9.4 mg/dL (ref 8.9–10.3)
Chloride: 107 mmol/L (ref 98–111)
Creatinine, Ser: 0.85 mg/dL (ref 0.44–1.00)
GFR, Estimated: >60 mL/min (ref >60)
Glucose, Bld: 104 mg/dL — ABNORMAL HIGH (ref 70–99)
Potassium: 3.5 mmol/L (ref 3.5–5.1)
Sodium: 143 mmol/L (ref 135–145)
Total Bilirubin: 0.6 mg/dL (ref 0.0–1.2)
Total Protein: 9.4 g/dL — ABNORMAL HIGH (ref 6.5–8.1)

## 2024-06-29 LAB — URINE DRUG SCREEN, QUALITATIVE (ARMC ONLY)
Amphetamines, Ur Screen: NOT DETECTED
Barbiturates, Ur Screen: NOT DETECTED
Benzodiazepine, Ur Scrn: NOT DETECTED
Cannabinoid 50 Ng, Ur ~~LOC~~: NOT DETECTED
Cocaine Metabolite,Ur ~~LOC~~: NOT DETECTED
MDMA (Ecstasy)Ur Screen: NOT DETECTED
Methadone Scn, Ur: NOT DETECTED
Opiate, Ur Screen: NOT DETECTED
Phencyclidine (PCP) Ur S: NOT DETECTED
Tricyclic, Ur Screen: NOT DETECTED

## 2024-06-29 LAB — RESP PANEL BY RT-PCR (RSV, FLU A&B, COVID)  RVPGX2
Influenza A by PCR: NEGATIVE
Influenza B by PCR: NEGATIVE
Resp Syncytial Virus by PCR: NEGATIVE
SARS Coronavirus 2 by RT PCR: NEGATIVE

## 2024-06-29 LAB — BASIC METABOLIC PANEL WITH GFR
Anion gap: 13 (ref 5–15)
BUN: 15 mg/dL (ref 6–20)
CO2: 21 mmol/L — ABNORMAL LOW (ref 22–32)
Calcium: 8.5 mg/dL — ABNORMAL LOW (ref 8.9–10.3)
Chloride: 113 mmol/L — ABNORMAL HIGH (ref 98–111)
Creatinine, Ser: 0.71 mg/dL (ref 0.44–1.00)
GFR, Estimated: >60 mL/min (ref >60)
Glucose, Bld: 85 mg/dL (ref 70–99)
Potassium: 3.6 mmol/L (ref 3.5–5.1)
Sodium: 147 mmol/L — ABNORMAL HIGH (ref 135–145)

## 2024-06-29 LAB — ETHANOL: Alcohol, Ethyl (B): 234 mg/dL — ABNORMAL HIGH (ref <15)

## 2024-06-29 MED ORDER — LORAZEPAM 2 MG PO TABS: 0.0000 mg | ORAL_TABLET | Freq: Four times a day (QID) | ORAL | Status: DC

## 2024-06-29 MED ORDER — MAGNESIUM HYDROXIDE 400 MG/5ML PO SUSP: 30.0000 mL | Freq: Every day | ORAL | Status: DC | PRN

## 2024-06-29 MED ORDER — AMLODIPINE-OLMESARTAN 10-20 MG PO TABS: 1.0000 | ORAL_TABLET | Freq: Every day | ORAL | Status: DC

## 2024-06-29 MED ORDER — LORAZEPAM 2 MG PO TABS: 0.0000 mg | ORAL_TABLET | Freq: Two times a day (BID) | ORAL | Status: DC

## 2024-06-29 MED ORDER — SODIUM CHLORIDE 0.9 % IV BOLUS
1000.0000 mL | Freq: Once | INTRAVENOUS | Status: AC
  Administered 2024-06-29: 1000 mL via INTRAVENOUS

## 2024-06-29 MED ORDER — OLANZAPINE 5 MG PO TBDP: 5.0000 mg | ORAL_TABLET | Freq: Three times a day (TID) | ORAL | Status: DC | PRN

## 2024-06-29 MED ORDER — PANTOPRAZOLE SODIUM 40 MG PO TBEC
40.0000 mg | DELAYED_RELEASE_TABLET | Freq: Every day | ORAL | Status: DC
  Administered 2024-06-30 – 2024-07-05 (×6): 40 mg via ORAL
  Filled 2024-06-29 (×6): qty 1

## 2024-06-29 MED ORDER — FOLIC ACID 1 MG PO TABS
1.0000 mg | ORAL_TABLET | Freq: Every day | ORAL | Status: DC
  Administered 2024-06-29: 1 mg via ORAL
  Filled 2024-06-29: qty 1

## 2024-06-29 MED ORDER — IRBESARTAN 150 MG PO TABS
150.0000 mg | ORAL_TABLET | Freq: Every day | ORAL | Status: DC
  Administered 2024-06-29 – 2024-07-05 (×7): 150 mg via ORAL
  Filled 2024-06-29 (×7): qty 1

## 2024-06-29 MED ORDER — TRAZODONE HCL 50 MG PO TABS
50.0000 mg | ORAL_TABLET | Freq: Every evening | ORAL | Status: DC | PRN
  Administered 2024-06-29 – 2024-07-04 (×5): 50 mg via ORAL
  Filled 2024-06-29 (×5): qty 1

## 2024-06-29 MED ORDER — ATORVASTATIN CALCIUM 10 MG PO TABS
10.0000 mg | ORAL_TABLET | Freq: Every day | ORAL | Status: DC
  Administered 2024-06-30 – 2024-07-05 (×6): 10 mg via ORAL
  Filled 2024-06-29 (×6): qty 1

## 2024-06-29 MED ORDER — FOLIC ACID 1 MG PO TABS
1.0000 mg | ORAL_TABLET | Freq: Every day | ORAL | Status: DC
  Administered 2024-06-30 – 2024-07-05 (×6): 1 mg via ORAL
  Filled 2024-06-29 (×6): qty 1

## 2024-06-29 MED ORDER — OLANZAPINE 10 MG IM SOLR: 5.0000 mg | Freq: Three times a day (TID) | INTRAMUSCULAR | Status: DC | PRN

## 2024-06-29 MED ORDER — THIAMINE MONONITRATE 100 MG PO TABS
100.0000 mg | ORAL_TABLET | Freq: Every day | ORAL | Status: DC
  Administered 2024-06-30 – 2024-07-05 (×6): 100 mg via ORAL
  Filled 2024-06-29 (×6): qty 1

## 2024-06-29 MED ORDER — ADULT MULTIVITAMIN W/MINERALS CH
1.0000 | ORAL_TABLET | Freq: Every day | ORAL | Status: DC
  Administered 2024-06-30 – 2024-07-05 (×6): 1 via ORAL
  Filled 2024-06-29 (×6): qty 1

## 2024-06-29 MED ORDER — ADULT MULTIVITAMIN W/MINERALS CH
1.0000 | ORAL_TABLET | Freq: Every day | ORAL | Status: DC
  Administered 2024-06-29: 1 via ORAL
  Filled 2024-06-29: qty 1

## 2024-06-29 MED ORDER — AMLODIPINE BESYLATE 5 MG PO TABS
10.0000 mg | ORAL_TABLET | Freq: Every day | ORAL | Status: DC
  Administered 2024-06-29 – 2024-07-05 (×6): 10 mg via ORAL
  Filled 2024-06-29 (×7): qty 2

## 2024-06-29 MED ORDER — ALUM & MAG HYDROXIDE-SIMETH 200-200-20 MG/5ML PO SUSP: 30.0000 mL | ORAL | Status: DC | PRN

## 2024-06-29 MED ORDER — LORAZEPAM 1 MG PO TABS: 0.0000 mg | ORAL_TABLET | Freq: Two times a day (BID) | ORAL | Status: DC

## 2024-06-29 MED ORDER — ACETAMINOPHEN 325 MG PO TABS: 650.0000 mg | ORAL_TABLET | Freq: Four times a day (QID) | ORAL | Status: DC | PRN

## 2024-06-29 MED ORDER — THIAMINE HCL 100 MG/ML IJ SOLN
100.0000 mg | Freq: Once | INTRAMUSCULAR | Status: AC
  Administered 2024-06-29: 100 mg via INTRAVENOUS
  Filled 2024-06-29: qty 2

## 2024-06-29 MED ORDER — THIAMINE HCL 100 MG/ML IJ SOLN: 100.0000 mg | Freq: Every day | INTRAMUSCULAR | Status: DC

## 2024-06-29 MED ORDER — LORAZEPAM 1 MG PO TABS: 0.0000 mg | ORAL_TABLET | Freq: Four times a day (QID) | ORAL | Status: DC

## 2024-06-29 MED ORDER — METFORMIN HCL 500 MG PO TABS
500.0000 mg | ORAL_TABLET | Freq: Two times a day (BID) | ORAL | Status: DC
  Administered 2024-06-30 – 2024-07-05 (×11): 500 mg via ORAL
  Filled 2024-06-29 (×13): qty 1

## 2024-06-29 MED ORDER — THIAMINE MONONITRATE 100 MG PO TABS
100.0000 mg | ORAL_TABLET | Freq: Every day | ORAL | Status: DC
  Filled 2024-06-29: qty 1
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
## 2024-06-29 NOTE — ED Notes (Signed)
 NP with pt in interview room for psych evaluation
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
## 2024-06-29 NOTE — ED Notes (Signed)
 IVC to go to [facility] psych after 7p

## 2024-06-29 NOTE — ED Notes (Addendum)
 Pt received phone call from her daughter; pt refused to speak with her or give her updates at this time.

## 2024-06-29 NOTE — BH Assessment (Addendum)
 Please be advised: As of 3:41 AM ET, assessment via [facility] have not been possible due to persistent bidirectional connectivity issues throughout the shift. Both Cart #1 and Cart #2 are currently non-operational.

## 2024-06-29 NOTE — Progress Notes (Signed)
 Patient has been accepted to [facility] 06/29/24 pending discharge. Patient assigned to room 28 Accepting physician is Dr. Jadapalle. Call report to [phone]. Representative was AC Linsey.   ER Staff is aware of it: Luann ER Secretary Dr. Nicholaus, ER MD Alfonso, RN Patient's Nurse

## 2024-06-29 NOTE — ED Notes (Signed)
 Glass of water provided to pt.
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
## 2024-06-29 NOTE — ED Notes (Signed)
 Lunch tray provided to pt.

## 2024-06-29 NOTE — ED Notes (Signed)
 Pt provided breakfast tray.

## 2024-06-29 NOTE — ED Notes (Signed)
 Dinner tray provided to pt

## 2024-06-29 NOTE — ED Notes (Signed)
IVC/Pending consult °

## 2024-06-29 NOTE — ED Notes (Signed)
 Triage middle; [place] officer w/ pt
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
## 2024-06-29 NOTE — ED Notes (Signed)
 Pt changed in safety scrubs in the presence of this RN and [place], [place]; pt belongings collected which include:  Chief Operating Officer Shoes Tshirt Underwear Bra 1-key Bracelet Earrings Nose-ring

## 2024-06-29 NOTE — ED Notes (Signed)
 Report given to Amy RN.

## 2024-06-29 NOTE — BH Assessment (Addendum)
 At 4:27 AM: Writer contacted [facility] to confirm the operational status of Cart #1 and to inquire about consult availability. Iris Representative Brandi reported that, due to a staffing shortage--specifically being down one provider--the next consult is expected to occur around 7:00 to 8:00 AM.

## 2024-06-30 ENCOUNTER — Inpatient Hospital Stay

## 2024-06-30 DIAGNOSIS — F319 Bipolar disorder, unspecified: Secondary | ICD-10-CM | POA: Diagnosis not present

## 2024-06-30 NOTE — Group Note (Signed)
 Physical/Occupational Therapy Group Note  Group Topic: Pain Management and Coping   Group Date: 06/30/2024 Start Time: 1305 End Time: 1405 Facilitators: Clive Warren CROME, OT   Group Description:  Group discussed impact of chronic/acute pain on safety and independence with functional tasks and impact on mental health.  Identified and discussed any previously learned or implemented strategies used.  Discussed and reviewed cognitive behavioral pain coping strategies to address/improve overall management of pain. Discussed relaxation, distraction techniques, cognitive restructuring, activity pacing/energy conservation, environment/home safety modifications, and role of sleep and sleep hygiene. Allowed time for questions and further discussion.      Therapeutic Goal(s):   Identify and discuss previously utilized pain coping strategies and implications of pain on function/well-being Identify and discuss implementing new cognitive behavioral pain coping strategies into daily routines Demonstrate understanding and performance of learned cognitive behavioral pain coping strategies.    Individual Participation: Pt present for duration of session. Pt engaged in group discussion and answer questions with and without prompts. Good eye contact and appeared engaged and interested throughout. Pt participated in the progressive relaxation activity.   Participation Level: Active and Engaged   Participation Quality: Independent   Behavior: Alert, Appropriate, Attentive , Calm, and Cooperative   Speech/Thought Process: Organized and Relevant   Affect/Mood: Appropriate   Insight: Moderate   Judgement: Moderate   Modes of Intervention: Activity, Clarification, Discussion, Education, Exploration, Problem-solving, Rapport Building, Socialization, and Support  Patient Response to Interventions:  Attentive, Engaged, Interested , and Receptive   Plan: Continue to engage patient in PT/OT groups 1 -  2x/week.  Alaysia Lightle R., MPH, MS, OTR/L ascom [phone] 06/30/24, 4:27 PM
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
## 2024-06-30 NOTE — BH IP Treatment Plan (Signed)
 Interdisciplinary Treatment and Diagnostic Plan Update  06/30/2024 Time of Session: 3:19 PM  VESTA WHEELAND MRN: [id number]  Principal Diagnosis: Bipolar 1 disorder (HCC)  Secondary Diagnoses: Principal Problem:   Bipolar 1 disorder (HCC)   Current Medications:  Current Facility-Administered Medications  Medication Dose Route Frequency Provider Last Rate Last Admin   acetaminophen  (TYLENOL ) tablet 650 mg  650 mg Oral Q6H PRN Smith, Annie B, NP       alum & mag hydroxide-simeth (MAALOX/MYLANTA) 200-200-20 MG/5ML suspension 30 mL  30 mL Oral Q4H PRN Smith, Annie B, NP       amLODipine  (NORVASC ) tablet 10 mg  10 mg Oral Daily Jadapalle, Sree, MD   10 mg at 06/30/24 9071   atorvastatin  (LIPITOR) tablet 10 mg  10 mg Oral Daily Smith, Annie B, NP   10 mg at 06/30/24 9071   folic acid  (FOLVITE ) tablet 1 mg  1 mg Oral Daily Smith, Annie B, NP   1 mg at 06/30/24 9071   irbesartan (AVAPRO) tablet 150 mg  150 mg Oral Daily Jadapalle, Sree, MD   150 mg at 06/30/24 9071   LORazepam  (ATIVAN ) tablet 0-4 mg  0-4 mg Oral Q6H Smith, Annie B, NP       Followed by   NOREEN ON 07/01/2024] LORazepam  (ATIVAN ) tablet 0-4 mg  0-4 mg Oral Q12H Smith, Annie B, NP       magnesium hydroxide (MILK OF MAGNESIA) suspension 30 mL  30 mL Oral Daily PRN Smith, Annie B, NP       metFORMIN  (GLUCOPHAGE ) tablet 500 mg  500 mg Oral BID WC Smith, Annie B, NP   500 mg at 06/30/24 9071   multivitamin with minerals tablet 1 tablet  1 tablet Oral Daily Smith, Annie B, NP   1 tablet at 06/30/24 0928   OLANZapine (ZYPREXA) injection 5 mg  5 mg Intramuscular TID PRN Smith, Annie B, NP       OLANZapine zydis (ZYPREXA) disintegrating tablet 5 mg  5 mg Oral TID PRN Smith, Annie B, NP       pantoprazole  (PROTONIX ) EC tablet 40 mg  40 mg Oral Daily Smith, Annie B, NP   40 mg at 06/30/24 9071   thiamine  (VITAMIN B1) tablet 100 mg  100 mg Oral Daily Smith, Annie B, NP   100 mg at 06/30/24 9071   Or   thiamine  (VITAMIN B1) injection 100 mg   100 mg Intravenous Daily Smith, Annie B, NP       traZODone (DESYREL) tablet 50 mg  50 mg Oral QHS PRN Smith, Annie B, NP   50 mg at 06/29/24 2217   PTA Medications: Medications Prior to Admission  Medication Sig Dispense Refill Last Dose/Taking   amlodipine -olmesartan  (AZOR) 10-20 MG tablet Take 1 tablet by mouth daily. 90 tablet 0    atorvastatin  (LIPITOR) 10 MG tablet TAKE 1 TABLET(10 MG) BY MOUTH EVERY EVENING 100 tablet 0    glucose blood (ONETOUCH ULTRA TEST) test strip Use with device to check sugars twice daily 100 each 12    metFORMIN  (GLUCOPHAGE ) 500 MG tablet Take 500 mg by mouth 2 (two) times daily with a meal.      pantoprazole  (PROTONIX ) 40 MG tablet Take 1 tablet (40 mg total) by mouth daily. 90 tablet 0    traZODone (DESYREL) 50 MG tablet Take 1 tablet (50 mg total) by mouth at bedtime as needed for sleep. 30 tablet 2     Patient Stressors:  Patient Strengths:    Treatment Modalities: Medication Management, Group therapy, Case management,  1 to 1 session with clinician, Psychoeducation, Recreational therapy.   Physician Treatment Plan for Primary Diagnosis: Bipolar 1 disorder (HCC) Long Term Goal(s):     Short Term Goals:    Medication Management: Evaluate patient's response, side effects, and tolerance of medication regimen.  Therapeutic Interventions: 1 to 1 sessions, Unit Group sessions and Medication administration.  Evaluation of Outcomes: Not Progressing  Physician Treatment Plan for Secondary Diagnosis: Principal Problem:   Bipolar 1 disorder (HCC)  Long Term Goal(s):     Short Term Goals:       Medication Management: Evaluate patient's response, side effects, and tolerance of medication regimen.  Therapeutic Interventions: 1 to 1 sessions, Unit Group sessions and Medication administration.  Evaluation of Outcomes: Not Progressing   RN Treatment Plan for Primary Diagnosis: Bipolar 1 disorder (HCC) Long Term Goal(s): Knowledge of disease and  therapeutic regimen to maintain health will improve  Short Term Goals: Ability to remain free from injury will improve, Ability to verbalize frustration and anger appropriately will improve, Ability to demonstrate self-control, Ability to participate in decision making will improve, Ability to verbalize feelings will improve, Ability to disclose and discuss suicidal ideas, Ability to identify and develop effective coping behaviors will improve, and Compliance with prescribed medications will improve  Medication Management: RN will administer medications as ordered by provider, will assess and evaluate patient's response and provide education to patient for prescribed medication. RN will report any adverse and/or side effects to prescribing provider.  Therapeutic Interventions: 1 on 1 counseling sessions, Psychoeducation, Medication administration, Evaluate responses to treatment, Monitor vital signs and CBGs as ordered, Perform/monitor CIWA, COWS, AIMS and Fall Risk screenings as ordered, Perform wound care treatments as ordered.  Evaluation of Outcomes: Not Progressing   LCSW Treatment Plan for Primary Diagnosis: Bipolar 1 disorder (HCC) Long Term Goal(s): Safe transition to appropriate next level of care at discharge, Engage patient in therapeutic group addressing interpersonal concerns.  Short Term Goals: Engage patient in aftercare planning with referrals and resources, Increase social support, Increase ability to appropriately verbalize feelings, Increase emotional regulation, Facilitate acceptance of mental health diagnosis and concerns, Facilitate patient progression through stages of change regarding substance use diagnoses and concerns, Identify triggers associated with mental health/substance abuse issues, and Increase skills for wellness and recovery  Therapeutic Interventions: Assess for all discharge needs, 1 to 1 time with Social worker, Explore available resources and support systems,  Assess for adequacy in community support network, Educate family and significant other(s) on suicide prevention, Complete Psychosocial Assessment, Interpersonal group therapy.  Evaluation of Outcomes: Not Progressing   Progress in Treatment: Attending groups: Yes. and No. Participating in groups: Yes. and No. Taking medication as prescribed: Yes. Toleration medication: Yes. Family/Significant other contact made: No, will contact:  CSW will contact if given permission  Patient understands diagnosis: Yes. Discussing patient identified problems/goals with staff: Yes. Medical problems stabilized or resolved: Yes. Denies suicidal/homicidal ideation: Yes. Issues/concerns per patient self-inventory: No. Other: None   New problem(s) identified: No, Describe:  None identified   New Short Term/Long Term Goal(s): detox, elimination of symptoms of psychosis, medication management for mood stabilization; elimination of SI thoughts; development of comprehensive mental wellness/sobriety plan.   Patient Goals:  First of all I think I got a little anger problem toward my daughter, and I would like to take care of that  Discharge Plan or Barriers: CSW will assist with appropriate discharge planning  Reason for Continuation of Hospitalization: Depression Medication stabilization  Estimated Length of Stay: 1 to 7 days  Last 3 [place] Suicide Severity Risk Score: Flowsheet Row Admission (Current) from 06/29/2024 in [facility] Most recent reading at 06/30/2024  1:00 AM ED from 06/29/2024 in [facility] Emergency Department at [facility] Most recent reading at 06/29/2024  2:05 AM ED from 08/11/2023 in [facility] Emergency Department at [facility] Most recent reading at 08/11/2023  9:48 AM  C-SSRS RISK CATEGORY No Risk Low Risk No Risk    Last PHQ 2/9 Scores:    06/27/2024    1:15 PM 09/27/2018    6:13 PM  Depression screen PHQ 2/9  Decreased Interest 2  3  Down, Depressed, Hopeless 3 3  PHQ - 2 Score 5 6  Altered sleeping 3 3  Tired, decreased energy 2 3  Change in appetite 2 2  Feeling bad or failure about yourself  2 1  Trouble concentrating 2 2  Moving slowly or fidgety/restless 0 1  Suicidal thoughts 0 0  PHQ-9 Score 16 18  Difficult doing work/chores Somewhat difficult Very difficult    Scribe for Treatment Team: Lum JONETTA Raynaldo ISRAEL 06/30/2024 3:33 PM

## 2024-06-30 NOTE — Plan of Care (Signed)
 Initiate POC as it is written.  Patient currently denies having ETOH dependence, but would like to work on eliminating ETOH completely.
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
## 2024-06-30 NOTE — Plan of Care (Signed)
  Problem: Activity: Goal: Interest or engagement in activities will improve Outcome: Progressing   Problem: Health Behavior/Discharge Planning: Goal: Compliance with treatment plan for underlying cause of condition will improve Outcome: Progressing   

## 2024-06-30 NOTE — Progress Notes (Signed)
 Behavior:  Pleasant and cooperative.   Psych assessment:  Endorses depression.  Denies SI/HI and AVH.  Interaction / Group attendance:  Present in the milieu for groups.  Appropriate interaction with peers and staff.   Medication/ PRNs:  Compliant.    Pain: Denies  15 min checks in place for safety.    CIWA:  0

## 2024-06-30 NOTE — Group Note (Signed)
 Date:  06/30/2024 Time:  10:18 AM  Group Topic/Focus:  Movement Therapy    Participation Level:  Did Not Attend    Norleen SHAUNNA Bias 06/30/2024, 10:18 AM

## 2024-06-30 NOTE — Group Note (Signed)
 Recreation Therapy Group Note   Group Topic:Other  Group Date: 06/30/2024 Start Time: 1400 End Time: 1500 Facilitators: Celestia Jeoffrey BRAVO, LRT, CTRS Location: Dayroom and Hallway  Group Description: Bag Decoration and Trick or Treating.  Patients received a brown paper bag to decorate for Halloween. Once complete, patients walked down the hall, with Halloween music playing in the background, to go trick or treating with LRT. Patients received candy with RN approval.   Goal Area(s) Addressed:  Patients will express themselves through art Patients will recall past holiday memories  Patients will increase communication    Affect/Mood: Appropriate   Participation Level: Active and Engaged   Participation Quality: Independent   Behavior: Appropriate, Calm, and Cooperative   Speech/Thought Process: Coherent   Insight: Good   Judgement: Good   Modes of Intervention: Art, Activity, and Music   Patient Response to Interventions:  Attentive, Engaged, Interested , and Receptive   Education Outcome:  Acknowledges education   Clinical Observations/Individualized Feedback: Viney was active in their participation of session activities and group discussion. Pt interacted well with LRT and peers duration of session.    Plan: Continue to engage patient in RT group sessions 2-3x/week.   Jeoffrey BRAVO Celestia, LRT, CTRS 06/30/2024 3:09 PM

## 2024-06-30 NOTE — Progress Notes (Signed)
   06/30/24 0548  CIWA-Ar  Nausea and Vomiting 0  Tactile Disturbances 0  Tremor 0  Auditory Disturbances 0  Paroxysmal Sweats 0  Visual Disturbances 0  Anxiety 0  Headache, Fullness in Head 0  Agitation 0  Orientation and Clouding of Sensorium 0  CIWA-Ar Total 0
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
## 2024-06-30 NOTE — Progress Notes (Signed)
   06/30/24 0500  Psych Admission Type (Psych Patients Only)  Admission Status Involuntary  Psychosocial Assessment  Patient Complaints None  Eye Contact Fair  Facial Expression Worried  Affect Appropriate to circumstance  Speech Soft  Interaction Minimal  Motor Activity Slow  Appearance/Hygiene Unremarkable;In scrubs  Behavior Characteristics Cooperative;Appropriate to situation;Calm  Mood Pleasant;Depressed  Thought Process  Coherency WDL  Content WDL  Delusions None reported or observed  Perception WDL  Hallucination None reported or observed  Judgment WDL  Confusion None  Danger to Self  Current suicidal ideation? Denies  Danger to Others  Danger to Others None reported or observed

## 2024-07-01 DIAGNOSIS — F319 Bipolar disorder, unspecified: Secondary | ICD-10-CM

## 2024-07-01 NOTE — Progress Notes (Signed)
   07/01/24 1000  Psych Admission Type (Psych Patients Only)  Admission Status Involuntary  Psychosocial Assessment  Patient Complaints Worrying  Eye Contact Fair  Facial Expression Sad  Affect Appropriate to circumstance  Speech Logical/coherent  Interaction Minimal  Motor Activity Slow  Appearance/Hygiene In scrubs  Behavior Characteristics Cooperative  Mood Pleasant  Thought Process  Coherency WDL  Content Blaming others  Delusions None reported or observed  Perception WDL  Hallucination None reported or observed  Judgment Impaired  Confusion None  Danger to Self  Current suicidal ideation? Denies  Danger to Others  Danger to Others None reported or observed

## 2024-07-01 NOTE — [facility] Suicide Risk Assessment (Signed)
 [facility] INPATIENT:  Family/Significant Other Suicide Prevention Education  Suicide Prevention Education:  Education Completed; Yasmin Haith, daughter, [phone],  (name of family member/significant other) has been identified by the patient as the family member/significant other with whom the patient will be residing, and identified as the person(s) who will aid the patient in the event of a mental health crisis (suicidal ideations/suicide attempt).  With written consent from the patient, the family member/significant other has been provided the following suicide prevention education, prior to the and/or following the discharge of the patient.  The suicide prevention education provided includes the following: Suicide risk factors Suicide prevention and interventions National Suicide Hotline telephone number [facility] assessment telephone number [facility] Emergency Assistance 911 [facility] and/or Residential Mobile Crisis Unit telephone number  Request made of family/significant other to: Remove weapons (e.g., guns, rifles, knives), all items previously/currently identified as safety concern.   Remove drugs/medications (over-the-counter, prescriptions, illicit drugs), all items previously/currently identified as a safety concern.  The family member/significant other verbalizes understanding of the suicide prevention education information provided.  The family member/significant other agrees to remove the items of safety concern listed above.  Samantha Deleon 07/01/2024, 1:13 PM
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
## 2024-07-01 NOTE — Group Note (Signed)
 Date:  07/01/2024 Time:  8:35 PM  Group Topic/Focus:  Rediscovering Joy:   The focus of this group is to explore various ways to relieve stress in a positive manner.    Participation Level:  Active  Participation Quality:  Appropriate  Affect:  Appropriate  Cognitive:  Appropriate  Insight: Good  Engagement in Group:  Engaged  Modes of Intervention:  Discussion  Additional Comments:    Samantha Deleon 07/01/2024, 8:35 PM

## 2024-07-01 NOTE — Group Note (Signed)
 Date:  07/01/2024 Time:  10:33 AM  Group Topic/Focus:  Coping With Mental Health Crisis:   The purpose of this group is to help patients identify strategies for coping with mental health crisis.  Group discusses possible causes of crisis and ways to manage them effectively.    Participation Level:  Did Not Attend  Leigh VEAR Pais 07/01/2024, 10:33 AM

## 2024-07-01 NOTE — Plan of Care (Signed)
   Problem: Activity: Goal: Interest or engagement in activities will improve Outcome: Progressing Goal: Sleeping patterns will improve Outcome: Progressing

## 2024-07-01 NOTE — Group Note (Signed)
 Date:  07/01/2024 Time:  4:05 PM  Group Topic/Focus:  Leisure/Bingo Group: The purpose of this group is to allow patients to participate in a fun activity with peers and interact with each other. [                                                                                                                                Participation Level:  Active  Participation Quality:  Appropriate  Affect:  Appropriate  Cognitive:  Appropriate  Insight: Appropriate  Engagement in Group:  Engaged  Modes of Intervention:  Activity  Additional Comments:    Beatris ONEIDA Hasten 07/01/2024, 4:05 PM

## 2024-07-01 NOTE — Plan of Care (Signed)
  Problem: Education: Goal: Knowledge of [facility] Education information/materials will improve Outcome: Progressing Goal: Mental status will improve Outcome: Progressing Goal: Verbalization of understanding the information provided will improve Outcome: Progressing   Problem: Activity: Goal: Sleeping patterns will improve Outcome: Progressing   Problem: Health Behavior/Discharge Planning: Goal: Compliance with treatment plan for underlying cause of condition will improve Outcome: Progressing
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
## 2024-07-01 NOTE — Progress Notes (Signed)
   06/30/24 2359  Psych Admission Type (Psych Patients Only)  Admission Status Involuntary  Psychosocial Assessment  Patient Complaints Worrying;Sadness  Eye Contact Fair  Facial Expression Sad  Affect Appropriate to circumstance  Speech Logical/coherent  Interaction Minimal  Motor Activity Other (Comment) (WNL, unremarkable)  Appearance/Hygiene Unremarkable  Behavior Characteristics Cooperative;Appropriate to situation  Mood Pleasant  Thought Process  Coherency WDL  Content Blaming others  Delusions None reported or observed  Perception WDL  Hallucination None reported or observed  Judgment Impaired  Confusion None  Danger to Self  Current suicidal ideation? Denies  Danger to Others  Danger to Others None reported or observed

## 2024-07-02 DIAGNOSIS — F319 Bipolar disorder, unspecified: Secondary | ICD-10-CM | POA: Diagnosis not present

## 2024-07-02 NOTE — Progress Notes (Signed)
   07/02/24 1200  Psych Admission Type (Psych Patients Only)  Admission Status Involuntary  Psychosocial Assessment  Patient Complaints Worrying  Eye Contact Fair  Facial Expression Sad  Affect Appropriate to circumstance  Speech Logical/coherent  Interaction Minimal  Motor Activity Slow  Appearance/Hygiene In scrubs  Behavior Characteristics Cooperative  Mood Pleasant  Thought Process  Coherency WDL  Content Blaming others  Delusions None reported or observed  Perception WDL  Hallucination None reported or observed  Judgment Impaired  Confusion None  Danger to Self  Current suicidal ideation? Denies  Danger to Others  Danger to Others None reported or observed
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
## 2024-07-02 NOTE — Group Note (Signed)
 [facility] LCSW Group Therapy Note   Group Date: 07/02/2024 Start Time: 1300 End Time: 1400   Type of Therapy/Topic:  Group Therapy:  Emotion Regulation  Participation Level:  Active   Mood: Euthymic. Calm.  Description of Group:    The purpose of this group is to assist patients in learning to regulate negative emotions and experience positive emotions. Patients will be guided to discuss ways in which they have been vulnerable to their negative emotions. These vulnerabilities will be juxtaposed with experiences of positive emotions or situations, and patients challenged to use positive emotions to combat negative ones. Special emphasis will be placed on coping with negative emotions in conflict situations, and patients will process healthy conflict resolution skills.  Therapeutic Goals: Patient will identify two positive emotions or experiences to reflect on in order to balance out negative emotions:  Patient will label two or more emotions that they find the most difficult to experience:  Patient will be able to demonstrate positive conflict resolution skills through discussion or role plays:   Summary of Patient Progress:   Patient participated actively and provided good feedback during today's group.    Therapeutic Modalities:   Cognitive Behavioral Therapy Feelings Identification Dialectical Behavioral Therapy   Aldo HERO Lyndel Sarate, LCSW

## 2024-07-02 NOTE — Plan of Care (Signed)
   Problem: Education: Goal: Emotional status will improve Outcome: Progressing Goal: Mental status will improve Outcome: Progressing   Problem: Activity: Goal: Sleeping patterns will improve Outcome: Progressing

## 2024-07-02 NOTE — Plan of Care (Signed)
  Problem: Education: Goal: Emotional status will improve 07/02/2024 1646 by Rendell Cooper SAILOR, RN Outcome: Progressing 07/02/2024 0516 by Rendell Cooper SAILOR, RN Outcome: Progressing Goal: Mental status will improve 07/02/2024 1646 by Rendell Cooper SAILOR, RN Outcome: Progressing 07/02/2024 0516 by Rendell Cooper SAILOR, RN Outcome: Progressing Goal: Verbalization of understanding the information provided will improve Outcome: Progressing   Problem: Activity: Goal: Sleeping patterns will improve 07/02/2024 1646 by Rendell Cooper SAILOR, RN Outcome: Progressing 07/02/2024 0516 by Rendell Cooper SAILOR, RN Outcome: Progressing

## 2024-07-02 NOTE — Group Note (Signed)
 Date:  07/02/2024 Time:  10:25 AM  Group Topic/Focus:  Movement Therapy    Participation Level:  Active  Participation Quality:  Appropriate  Affect:  Appropriate  Cognitive:  Appropriate  Insight: Appropriate  Engagement in Group:  Engaged  Modes of Intervention:  Activity  Additional Comments:  none  Norleen SHAUNNA Bias 07/02/2024, 10:25 AM

## 2024-07-02 NOTE — Progress Notes (Signed)
   07/01/24 2249  Psych Admission Type (Psych Patients Only)  Admission Status Involuntary  Psychosocial Assessment  Patient Complaints Worrying;Sadness  Eye Contact Fair  Facial Expression Sad  Affect Appropriate to circumstance  Speech Logical/coherent  Interaction Minimal  Motor Activity Slow  Appearance/Hygiene In scrubs  Behavior Characteristics Cooperative;Appropriate to situation  Mood Pleasant  Thought Process  Coherency WDL  Content Blaming others  Delusions None reported or observed  Perception WDL  Hallucination None reported or observed  Judgment Impaired  Confusion None  Danger to Self  Current suicidal ideation? Denies  Danger to Others  Danger to Others None reported or observed

## 2024-07-03 DIAGNOSIS — F319 Bipolar disorder, unspecified: Secondary | ICD-10-CM | POA: Diagnosis not present

## 2024-07-03 NOTE — Progress Notes (Signed)
 Patient is an involuntary admission to [facility] for Bipolar disorder.  Ciwa has completed. Patient interacts well with peers and staff. Plan is to discharge Weds. Patient denies SI, HI, AVH, anxiety and depression.  Attended group activities.  Will continue to monitor.

## 2024-07-03 NOTE — Group Note (Signed)
 Recreation Therapy Group Note   Group Topic:Other  Group Date: 07/03/2024 Start Time: 1400 End Time: 1445 Facilitators: Celestia Jeoffrey BRAVO, LRT, CTRS Location: Dayroom  Activity Description/Intervention: Therapeutic Drumming. Patients with peers and staff were given the opportunity to engage in a leader facilitated [facility] Group Empowerment Drumming Circle with staff from the [employer], in partnership with [employer]. Teaching laboratory technician and trained [place], Norleen Mon leading with LRT observing and documenting intervention and pt response. This evidenced-based practice targets 7 areas of health and wellbeing in the human experience including: stress-reduction, exercise, self-expression, camaraderie/support, nurturing, spirituality, and music-making (leisure).    Goal Area(s) Addresses:  Patient will engage in pro-social way in music group.  Patient will follow directions of drum leader on the first prompt. Patient will demonstrate no behavioral issues during group.  Patient will identify if a reduction in stress level occurs as a result of participation in therapeutic drum circle.    Affect/Mood: Appropriate   Participation Level: Active and Engaged   Participation Quality: Independent   Behavior: Calm and Cooperative   Speech/Thought Process: Coherent   Insight: Good   Judgement: Good   Modes of Intervention: Music   Patient Response to Interventions:  Attentive, Engaged, Interested , and Receptive   Education Outcome:  Acknowledges education   Clinical Observations/Individualized Feedback: Samantha Deleon was active in their participation of session activities and group discussion.    Plan: Continue to engage patient in RT group sessions 2-3x/week.   Jeoffrey BRAVO Celestia, LRT, CTRS 07/03/2024 4:12 PM
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
## 2024-07-03 NOTE — Plan of Care (Signed)
  Problem: Education: Goal: Emotional status will improve Outcome: Progressing Goal: Mental status will improve Outcome: Progressing Goal: Verbalization of understanding the information provided will improve 07/03/2024 0506 by Rendell Cooper SAILOR, RN Outcome: Progressing 07/02/2024 1646 by Rendell Cooper SAILOR, RN Outcome: Progressing   Problem: Activity: Goal: Sleeping patterns will improve 07/03/2024 0506 by Rendell Cooper SAILOR, RN Outcome: Progressing 07/02/2024 1646 by Rendell Cooper SAILOR, RN Outcome: Progressing   Problem: Safety: Goal: Periods of time without injury will increase Outcome: Progressing

## 2024-07-03 NOTE — Progress Notes (Signed)
   07/03/24 0016  Psych Admission Type (Psych Patients Only)  Admission Status Involuntary  Psychosocial Assessment  Patient Complaints None  Eye Contact Fair  Facial Expression Sad  Affect Appropriate to circumstance  Speech Logical/coherent  Interaction Minimal  Motor Activity Slow  Appearance/Hygiene In scrubs  Behavior Characteristics Cooperative  Mood Pleasant  Thought Process  Coherency WDL  Content Blaming others  Delusions None reported or observed  Perception WDL  Hallucination None reported or observed  Judgment Impaired  Confusion None  Danger to Self  Current suicidal ideation? Denies  Danger to Others  Danger to Others None reported or observed

## 2024-07-03 NOTE — Group Note (Signed)
 Date:  07/03/2024 Time:  11:24 PM  Group Topic/Focus:  Wrap-Up Group:   The focus of this group is to help patients review their daily goal of treatment and discuss progress on daily workbooks.    Participation Level:  Active  Participation Quality:  Appropriate  Affect:  Appropriate  Cognitive:  Alert  Insight: Appropriate  Engagement in Group:  Engaged  Modes of Intervention:  Discussion  Additional Comments:    Samantha Deleon 07/03/2024, 11:24 PM

## 2024-07-04 ENCOUNTER — Other Ambulatory Visit: Payer: Self-pay | Admitting: Internal Medicine

## 2024-07-04 DIAGNOSIS — I10 Essential (primary) hypertension: Secondary | ICD-10-CM

## 2024-07-04 DIAGNOSIS — F319 Bipolar disorder, unspecified: Secondary | ICD-10-CM | POA: Diagnosis not present

## 2024-07-04 MED ORDER — VENLAFAXINE HCL ER 37.5 MG PO CP24
37.5000 mg | ORAL_CAPSULE | Freq: Every day | ORAL | Status: DC
  Administered 2024-07-04 – 2024-07-05 (×2): 37.5 mg via ORAL
  Filled 2024-07-04 (×2): qty 1

## 2024-07-04 NOTE — Group Note (Signed)
 LCSW Group Therapy Note  Group Date: 07/04/2024 Start Time: 1315 End Time: 1400   Type of Therapy and Topic:  Group Therapy - How To Cope with Nervousness about Discharge   Participation Level:  Active   Description of Group This process group involved identification of patients' feelings about discharge. Some of them are scheduled to be discharged soon, while others are new admissions, but each of them was asked to share thoughts and feelings surrounding discharge from the hospital. One common theme was that they are excited at the prospect of going home, while another was that many of them are apprehensive about sharing why they were hospitalized. Patients were given the opportunity to discuss these feelings with their peers in preparation for discharge.  Therapeutic Goals  Patient will identify their overall feelings about pending discharge. Patient will think about how they might proactively address issues that they believe will once again arise once they get home (i.e. with parents). Patients will participate in discussion about having hope for change.   Summary of Patient Progress:  Solimar was very active throughout the session. Likisha demonstrated Hiromi insight into the subject matter, and proved open to input from peers and feedback from CSW. Felisha was respectful of peers and participated throughout the entire session.   Therapeutic Modalities Cognitive Behavioral Therapy   Lum [name], LCSWA 07/04/2024  1:53 PM

## 2024-07-04 NOTE — Plan of Care (Signed)
  Problem: Education: Goal: Knowledge of [facility] Education information/materials will improve Outcome: Completed/Met Goal: Mental status will improve Outcome: Progressing   Problem: Activity: Goal: Interest or engagement in activities will improve Outcome: Progressing

## 2024-07-04 NOTE — Group Note (Signed)
 Date:  07/04/2024 Time:  12:44 PM  Group Topic/Focus:  Overcoming Stress:   The focus of this group is to define stress and help patients assess their triggers.    Participation Level:  Active  Participation Quality:  Appropriate  Affect:  Appropriate  Cognitive:  Appropriate  Insight: Appropriate  Engagement in Group:  Engaged  Modes of Intervention:  Discussion  Samantha Deleon 07/04/2024, 12:44 PM

## 2024-07-04 NOTE — Group Note (Signed)
 Recreation Therapy Group Note   Group Topic:Communication  Group Date: 07/04/2024 Start Time: 1400 End Time: 1430 Facilitators: Celestia Jeoffrey BRAVO, LRT, CTRS Location: Dayroom  Group Description: Name That Food. Pt will randomly select 1 cut paper from laminated stack of popular food images from the 1970's-1990's from LRT. Without showing the group the image they selected, pt will use descriptive words to explain what food they selected. Once the image is correctly guessed, LRT and pts will reminisce and discuss past fond memories associated with the food as well as the importance of communication.  Goal Area(s) Addressed: Patient will increase communication skills.  Patient will increase frustration tolerance skills. Patient will reminisce a fond memory in their life.     Affect/Mood: N/A   Participation Level: Did not attend    Clinical Observations/Individualized Feedback: Patient did not attend group.    Plan: Continue to engage patient in RT group sessions 2-3x/week.   Jeoffrey BRAVO Celestia, LRT, CTRS 07/04/2024 4:40 PM

## 2024-07-04 NOTE — Plan of Care (Signed)
  Problem: Activity: Goal: Sleeping patterns will improve Outcome: Progressing   Problem: Education: Goal: Mental status will improve Outcome: Progressing   Problem: Safety: Goal: Periods of time without injury will increase Outcome: Progressing
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
## 2024-07-04 NOTE — Progress Notes (Signed)
   07/04/24 0830  Psych Admission Type (Psych Patients Only)  Admission Status Involuntary  Psychosocial Assessment  Patient Complaints None  Eye Contact Fair  Facial Expression Flat  Affect Appropriate to circumstance  Speech Logical/coherent  Interaction Minimal  Motor Activity Slow  Appearance/Hygiene In scrubs  Behavior Characteristics Cooperative  Mood Pleasant  Thought Process  Coherency WDL  Content WDL  Delusions None reported or observed  Perception WDL  Hallucination None reported or observed  Judgment Poor  Confusion None  Danger to Self  Current suicidal ideation? Denies  Danger to Others  Danger to Others None reported or observed

## 2024-07-05 DIAGNOSIS — F319 Bipolar disorder, unspecified: Secondary | ICD-10-CM | POA: Diagnosis not present

## 2024-07-05 MED ORDER — ATORVASTATIN CALCIUM 10 MG PO TABS: 10.0000 mg | ORAL_TABLET | Freq: Every day | ORAL | 0 refills | Status: DC

## 2024-07-05 MED ORDER — FOLIC ACID 1 MG PO TABS: 1.0000 mg | ORAL_TABLET | Freq: Every day | ORAL | 0 refills | Status: AC

## 2024-07-05 MED ORDER — IRBESARTAN 150 MG PO TABS: 150.0000 mg | ORAL_TABLET | Freq: Every day | ORAL | 0 refills | Status: DC

## 2024-07-05 MED ORDER — VENLAFAXINE HCL ER 37.5 MG PO CP24: 37.5000 mg | ORAL_CAPSULE | Freq: Every day | ORAL | 0 refills | Status: AC

## 2024-07-05 MED ORDER — AMLODIPINE BESYLATE 10 MG PO TABS: 10.0000 mg | ORAL_TABLET | Freq: Every day | ORAL | 0 refills | Status: DC

## 2024-07-05 MED ORDER — VITAMIN B-1 100 MG PO TABS: 100.0000 mg | ORAL_TABLET | Freq: Every day | ORAL | 0 refills | Status: AC

## 2024-07-05 NOTE — Progress Notes (Signed)
  [facility] Adult Case Management Discharge Plan :  Will you be returning to the same living situation after discharge:  Yes,  pt will return home  At discharge, do you have transportation home?: Yes,  pt's daughter  Do you have the ability to pay for your medications: Yes,  [employer] MEDICARE / DREMA DUAL COMPLETE  Release of information consent forms completed and in the chart;  Patient's signature needed at discharge.  Patient to Follow up at:  Follow-up Information     [facility] Follow up.   Why: Your Individual Support Services team will follow up with following your discharge on 07/05/24 to continue services. Contact information: [facility] [address] [place] [place], [place]  [address] Phone: [phone]   Fax: [phone]  [place] [address] [place], [place]   [address] Phone: [phone]  Fax: [phone]                Next level of care provider has access to [facility] Link:no  Safety Planning and Suicide Prevention discussed: Yes,  Yasmin Haith, daughter, [phone]     Has patient been referred to the Quitline?: Patient does not use tobacco/nicotine products  Patient has been referred for addiction treatment: No known substance use disorder.  [address] LCSWA 07/05/2024, 10:19 AM

## 2024-07-05 NOTE — Progress Notes (Signed)
 Patient ID: Samantha Deleon, female   DOB: 1966-04-28, 57 y.o.   MRN: [id number] Reviewed discharge instructions, follow up appointments and discharge information, pt verbalized understanding. Belongings returned to patients satisfaction, pt discharged home.

## 2024-07-05 NOTE — [facility]HH Suicide Risk Assessment (Signed)
 [facility] Discharge Suicide Risk Assessment   Principal Problem: Bipolar I disorder, most recent episode depressed (HCC) Discharge Diagnoses: Principal Problem:   Bipolar I disorder, most recent episode depressed (HCC)   Total Time spent with patient: 30 minutes  Musculoskeletal: Strength & Muscle Tone: within normal limits Gait & Station: normal Patient leans: N/A  Psychiatric Specialty Exam  Presentation  General Appearance:  Appropriate for Environment; Casual  Eye Contact: Fair  Speech: Clear and Coherent  Speech Volume: Normal  Handedness: Right   Mood and Affect  Mood: Euthymic  Duration of Depression Symptoms: No data recorded Affect: Appropriate   Thought Process  Thought Processes: Coherent  Descriptions of Associations:Intact  Orientation:Full (Time, Place and Person)  Thought Content:Logical  History of Schizophrenia/Schizoaffective disorder:No data recorded Duration of Psychotic Symptoms:No data recorded Hallucinations:Hallucinations: None  Ideas of Reference:None  Suicidal Thoughts:Suicidal Thoughts: No  Homicidal Thoughts:Homicidal Thoughts: No   Sensorium  Memory: Immediate Fair; Recent Fair; Remote Fair  Judgment: Fair  Insight: Fair   Art Therapist  Concentration: Fair  Attention Span: Fair  Recall: [employer] of Knowledge: Fair  Language: Fair   Psychomotor Activity  Psychomotor Activity: Psychomotor Activity: Normal   Assets  Assets: Communication Skills; Desire for Improvement; Social Support   Sleep  Sleep: Sleep: Fair  Estimated Sleeping Duration (Last 24 Hours): 8.00-10.00 hours (Due to Daylight Saving Time, the durations displayed may not accurately represent documentation during the time change interval)  Physical Exam: Physical Exam Vitals and nursing note reviewed.    ROS Blood pressure 118/79, pulse 80, temperature 98.2 F (36.8 C), resp. rate 17, height 5' 2.5 (1.588 m),  weight 74.2 kg, last menstrual period 06/16/2014, SpO2 98%. Body mass index is 29.43 kg/m.  Mental Status Per Nursing Assessment::   On Admission:  NA  Demographic Factors:  Low socioeconomic status  Loss Factors: Decrease in vocational status  Historical Factors: Impulsivity  Risk Reduction Factors:   Living with another person, especially a relative, Positive social support, Positive therapeutic relationship, and Positive coping skills or problem solving skills  Continued Clinical Symptoms:  Depression:   Comorbid alcohol abuse/dependence  Cognitive Features That Contribute To Risk:  None    Suicide Risk:  Minimal: No identifiable suicidal ideation.  Patients presenting with no risk factors but with morbid ruminations; may be classified as minimal risk based on the severity of the depressive symptoms   Follow-up Information     [facility] Follow up.   Why: Your Individual Support Services team will follow up with following your discharge on 07/05/24 to continue services. Contact information: [facility] [address] [place] [place], [place]  [address] Phone: [phone]   Fax: [phone]  [place] [address] [place], [place]   [address] Phone: [phone]  Fax: [phone]                Plan Of Care/Follow-up recommendations:  Activity:  As tolerated  Allyn Foil, MD 07/05/2024, 10:20 AM

## 2024-07-05 NOTE — Plan of Care (Signed)
  Problem: Education: Goal: Emotional status will improve Outcome: Completed/Met Goal: Mental status will improve Outcome: Completed/Met Goal: Verbalization of understanding the information provided will improve Outcome: Completed/Met   Problem: Activity: Goal: Interest or engagement in activities will improve Outcome: Completed/Met Goal: Sleeping patterns will improve Outcome: Completed/Met   Problem: Health Behavior/Discharge Planning: Goal: Compliance with treatment plan for underlying cause of condition will improve Outcome: Completed/Met

## 2024-07-05 NOTE — Group Note (Signed)
 Date:  07/05/2024 Time:  10:54 AM  Group Topic/Focus:  Building Self Esteem:   The Focus of this group is helping patients become aware of the effects of self-esteem on their lives, the things they and others do that enhance or undermine their self-esteem, seeing the relationship between their level of self-esteem and the choices they make and learning ways to enhance self-esteem.    Participation Level:  Did Not Attend   Harlene LITTIE Gavel 07/05/2024, 10:54 AM

## 2024-07-05 NOTE — Progress Notes (Signed)
   07/04/24 2100  Psych Admission Type (Psych Patients Only)  Admission Status Involuntary  Psychosocial Assessment  Patient Complaints None  Eye Contact Fair  Facial Expression Flat  Affect Appropriate to circumstance  Speech Logical/coherent  Interaction Minimal  Motor Activity Slow  Appearance/Hygiene In scrubs;Unremarkable  Behavior Characteristics Appropriate to situation;Cooperative  Mood Pleasant  Thought Process  Coherency WDL  Content WDL  Delusions None reported or observed  Perception WDL  Hallucination None reported or observed  Judgment Impaired  Confusion None  Danger to Self  Current suicidal ideation? Denies  Danger to Others  Danger to Others None reported or observed

## 2024-07-05 NOTE — Care Management Important Message (Signed)
 Important Message  Patient Details  Name: Samantha Deleon MRN: [id number] Date of Birth: 1966/08/15   Important Message Given:  Yes - Medicare IM     Lum [name], LCSWA 07/05/2024, 10:20 AM

## 2024-07-05 NOTE — Progress Notes (Signed)
   07/05/24 0910  Psych Admission Type (Psych Patients Only)  Admission Status Involuntary  Psychosocial Assessment  Patient Complaints None  Eye Contact Fair  Facial Expression Flat  Affect Appropriate to circumstance  Speech Logical/coherent  Interaction Minimal  Motor Activity Slow  Appearance/Hygiene In scrubs  Behavior Characteristics Appropriate to situation  Mood Pleasant  Thought Process  Coherency WDL  Content WDL  Delusions None reported or observed  Perception WDL  Hallucination None reported or observed  Judgment Poor  Confusion None  Danger to Self  Current suicidal ideation? Denies  Danger to Others  Danger to Others None reported or observed
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
## 2024-07-05 NOTE — Plan of Care (Signed)
?  Problem: Education: ?Goal: Emotional status will improve ?Outcome: Progressing ?Goal: Mental status will improve ?Outcome: Progressing ?Goal: Verbalization of understanding the information provided will improve ?Outcome: Progressing ?  ?Problem: Activity: ?Goal: Interest or engagement in activities will improve ?Outcome: Progressing ?Goal: Sleeping patterns will improve ?Outcome: Progressing ?  ?Problem: Health Behavior/Discharge Planning: ?Goal: Compliance with treatment plan for underlying cause of condition will improve ?Outcome: Progressing ?  ?Problem: Physical Regulation: ?Goal: Ability to maintain clinical measurements within normal limits will improve ?Outcome: Progressing ?  ?Problem: Safety: ?Goal: Periods of time without injury will increase ?Outcome: Progressing ?  ?

## 2024-07-05 NOTE — BHH Counselor (Signed)
 CSW gave pt blank IM due to IM not being scanned in at this time.   Lum Croft, MSW, [place] 07/05/2024 10:25 AM

## 2024-07-17 ENCOUNTER — Encounter: Payer: Self-pay | Admitting: *Deleted

## 2024-07-17 NOTE — Progress Notes (Signed)
 Samantha Deleon                                          MRN: [id number]   07/17/2024   The [facility] Quality Team Specialist reviewed this patient medical record for the purposes of chart review for care gap closure. The following were reviewed: abstraction for care gap closure-kidney health evaluation for diabetes:eGFR  and uACR.    [facility] Quality Team

## 2024-08-27 ENCOUNTER — Other Ambulatory Visit: Payer: Self-pay | Admitting: Internal Medicine

## 2024-09-08 ENCOUNTER — Telehealth: Payer: Self-pay

## 2024-09-08 NOTE — Telephone Encounter (Signed)
 [facility] sent fax asking for pt meds to be sent to them, we need to call and ask the patient if she is wanting this to be sent there or if she wants to keep it at [facility]>

## 2024-09-18 ENCOUNTER — Telehealth: Payer: Self-pay

## 2024-09-18 NOTE — Telephone Encounter (Signed)
 Please call the patient and confirm that she is wanting to use [facility] for her pharmacy they sent a fax over stating that she gave verbal okay on January 6, if she did consent to this please send back to me so I can send the medications to the correct providers

## 2024-09-22 ENCOUNTER — Other Ambulatory Visit: Payer: Self-pay | Admitting: Internal Medicine

## 2024-09-24 ENCOUNTER — Other Ambulatory Visit: Payer: Self-pay | Admitting: Internal Medicine

## 2024-09-27 ENCOUNTER — Ambulatory Visit: Admitting: Internal Medicine

## 2024-09-29 NOTE — Telephone Encounter (Signed)
 [facility] [place]  is requesting Amlodipine  10mg , Amlodipine  Olmesartan  10-20mg , atorvastatin  10mg ,  metformin  500mg  , olmesartan  20mg , pantoprazole  40mg ,

## 2024-10-04 ENCOUNTER — Other Ambulatory Visit: Payer: Self-pay | Admitting: Internal Medicine

## 2024-10-04 DIAGNOSIS — I10 Essential (primary) hypertension: Secondary | ICD-10-CM

## 2024-10-09 ENCOUNTER — Ambulatory Visit: Admitting: Internal Medicine
